# Patient Record
Sex: Female | Born: 1980 | Race: Black or African American | Hispanic: No | Marital: Married | Smoking: Never smoker
Health system: Southern US, Community
[De-identification: ages and names within clinical notes are randomized; demographics above are authoritative.]

## PROBLEM LIST (undated history)

## (undated) ENCOUNTER — Inpatient Hospital Stay (HOSPITAL_COMMUNITY): Payer: Self-pay

## (undated) DIAGNOSIS — E559 Vitamin D deficiency, unspecified: Secondary | ICD-10-CM

## (undated) DIAGNOSIS — K219 Gastro-esophageal reflux disease without esophagitis: Secondary | ICD-10-CM

## (undated) DIAGNOSIS — E739 Lactose intolerance, unspecified: Secondary | ICD-10-CM

## (undated) DIAGNOSIS — R0602 Shortness of breath: Secondary | ICD-10-CM

## (undated) DIAGNOSIS — R5383 Other fatigue: Secondary | ICD-10-CM

## (undated) DIAGNOSIS — D649 Anemia, unspecified: Secondary | ICD-10-CM

## (undated) HISTORY — DX: Lactose intolerance, unspecified: E73.9

## (undated) HISTORY — DX: Other fatigue: R53.83

## (undated) HISTORY — DX: Shortness of breath: R06.02

## (undated) HISTORY — DX: Anemia, unspecified: D64.9

## (undated) HISTORY — DX: Vitamin D deficiency, unspecified: E55.9

---
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
## 1999-04-20 ENCOUNTER — Inpatient Hospital Stay (HOSPITAL_COMMUNITY): Admission: AD | Admit: 1999-04-20 | Discharge: 1999-04-20 | Payer: Self-pay | Admitting: Obstetrics

## 1999-06-01 ENCOUNTER — Encounter: Payer: Self-pay | Admitting: Emergency Medicine

## 1999-06-01 ENCOUNTER — Emergency Department (HOSPITAL_COMMUNITY): Admission: EM | Admit: 1999-06-01 | Discharge: 1999-06-01 | Payer: Self-pay | Admitting: Emergency Medicine

## 1999-06-21 ENCOUNTER — Emergency Department (HOSPITAL_COMMUNITY): Admission: EM | Admit: 1999-06-21 | Discharge: 1999-06-21 | Payer: Self-pay | Admitting: Emergency Medicine

## 1999-07-08 ENCOUNTER — Inpatient Hospital Stay (HOSPITAL_COMMUNITY): Admission: AD | Admit: 1999-07-08 | Discharge: 1999-07-08 | Payer: Self-pay | Admitting: Obstetrics

## 1999-09-26 ENCOUNTER — Emergency Department (HOSPITAL_COMMUNITY): Admission: EM | Admit: 1999-09-26 | Discharge: 1999-09-26 | Payer: Self-pay | Admitting: Emergency Medicine

## 2000-08-07 ENCOUNTER — Inpatient Hospital Stay (HOSPITAL_COMMUNITY): Admission: AD | Admit: 2000-08-07 | Discharge: 2000-08-07 | Payer: Self-pay | Admitting: Obstetrics

## 2001-02-12 ENCOUNTER — Emergency Department (HOSPITAL_COMMUNITY): Admission: EM | Admit: 2001-02-12 | Discharge: 2001-02-12 | Payer: Self-pay | Admitting: Emergency Medicine

## 2001-02-13 ENCOUNTER — Encounter: Payer: Self-pay | Admitting: Emergency Medicine

## 2001-02-13 ENCOUNTER — Emergency Department (HOSPITAL_COMMUNITY): Admission: EM | Admit: 2001-02-13 | Discharge: 2001-02-13 | Payer: Self-pay

## 2001-08-20 ENCOUNTER — Encounter: Payer: Self-pay | Admitting: Emergency Medicine

## 2001-08-20 ENCOUNTER — Emergency Department (HOSPITAL_COMMUNITY): Admission: EM | Admit: 2001-08-20 | Discharge: 2001-08-20 | Payer: Self-pay | Admitting: *Deleted

## 2001-10-06 ENCOUNTER — Emergency Department (HOSPITAL_COMMUNITY): Admission: EM | Admit: 2001-10-06 | Discharge: 2001-10-07 | Payer: Self-pay | Admitting: Emergency Medicine

## 2002-05-26 ENCOUNTER — Inpatient Hospital Stay (HOSPITAL_COMMUNITY): Admission: AD | Admit: 2002-05-26 | Discharge: 2002-05-26 | Payer: Self-pay | Admitting: Obstetrics and Gynecology

## 2002-05-26 ENCOUNTER — Encounter: Payer: Self-pay | Admitting: Obstetrics

## 2002-05-28 ENCOUNTER — Inpatient Hospital Stay (HOSPITAL_COMMUNITY): Admission: AD | Admit: 2002-05-28 | Discharge: 2002-05-30 | Payer: Self-pay | Admitting: Obstetrics

## 2002-05-29 ENCOUNTER — Encounter: Payer: Self-pay | Admitting: Obstetrics

## 2002-06-03 ENCOUNTER — Inpatient Hospital Stay (HOSPITAL_COMMUNITY): Admission: AD | Admit: 2002-06-03 | Discharge: 2002-06-03 | Payer: Self-pay | Admitting: Obstetrics

## 2002-06-03 ENCOUNTER — Encounter: Payer: Self-pay | Admitting: Obstetrics

## 2002-06-06 ENCOUNTER — Ambulatory Visit (HOSPITAL_COMMUNITY): Admission: RE | Admit: 2002-06-06 | Discharge: 2002-06-06 | Payer: Self-pay | Admitting: Obstetrics

## 2002-06-06 ENCOUNTER — Encounter (INDEPENDENT_AMBULATORY_CARE_PROVIDER_SITE_OTHER): Payer: Self-pay

## 2002-09-26 ENCOUNTER — Inpatient Hospital Stay (HOSPITAL_COMMUNITY): Admission: AD | Admit: 2002-09-26 | Discharge: 2002-09-26 | Payer: Self-pay | Admitting: Obstetrics

## 2003-09-07 ENCOUNTER — Inpatient Hospital Stay (HOSPITAL_COMMUNITY): Admission: AD | Admit: 2003-09-07 | Discharge: 2003-09-07 | Payer: Self-pay | Admitting: Obstetrics

## 2003-09-08 ENCOUNTER — Inpatient Hospital Stay (HOSPITAL_COMMUNITY): Admission: AD | Admit: 2003-09-08 | Discharge: 2003-09-08 | Payer: Self-pay | Admitting: Obstetrics

## 2005-03-21 ENCOUNTER — Inpatient Hospital Stay (HOSPITAL_COMMUNITY): Admission: AD | Admit: 2005-03-21 | Discharge: 2005-03-21 | Payer: Self-pay | Admitting: Obstetrics

## 2005-03-24 ENCOUNTER — Inpatient Hospital Stay (HOSPITAL_COMMUNITY): Admission: AD | Admit: 2005-03-24 | Discharge: 2005-03-24 | Payer: Self-pay | Admitting: Obstetrics

## 2005-03-28 ENCOUNTER — Ambulatory Visit (HOSPITAL_COMMUNITY): Admission: AD | Admit: 2005-03-28 | Discharge: 2005-03-28 | Payer: Self-pay | Admitting: Obstetrics

## 2005-10-16 ENCOUNTER — Emergency Department (HOSPITAL_COMMUNITY): Admission: EM | Admit: 2005-10-16 | Discharge: 2005-10-17 | Payer: Self-pay | Admitting: Emergency Medicine

## 2006-05-09 ENCOUNTER — Inpatient Hospital Stay (HOSPITAL_COMMUNITY): Admission: AD | Admit: 2006-05-09 | Discharge: 2006-05-09 | Payer: Self-pay | Admitting: Obstetrics and Gynecology

## 2006-05-21 ENCOUNTER — Emergency Department (HOSPITAL_COMMUNITY): Admission: EM | Admit: 2006-05-21 | Discharge: 2006-05-21 | Payer: Self-pay | Admitting: Emergency Medicine

## 2006-06-03 ENCOUNTER — Emergency Department (HOSPITAL_COMMUNITY): Admission: EM | Admit: 2006-06-03 | Discharge: 2006-06-04 | Payer: Self-pay | Admitting: Emergency Medicine

## 2006-07-23 ENCOUNTER — Emergency Department (HOSPITAL_COMMUNITY): Admission: EM | Admit: 2006-07-23 | Discharge: 2006-07-24 | Payer: Self-pay | Admitting: Emergency Medicine

## 2006-11-14 ENCOUNTER — Inpatient Hospital Stay (HOSPITAL_COMMUNITY): Admission: AD | Admit: 2006-11-14 | Discharge: 2006-11-14 | Payer: Self-pay | Admitting: Obstetrics

## 2006-11-21 ENCOUNTER — Ambulatory Visit (HOSPITAL_COMMUNITY): Admission: RE | Admit: 2006-11-21 | Discharge: 2006-11-21 | Payer: Self-pay | Admitting: Obstetrics

## 2006-11-27 ENCOUNTER — Inpatient Hospital Stay (HOSPITAL_COMMUNITY): Admission: AD | Admit: 2006-11-27 | Discharge: 2006-11-27 | Payer: Self-pay | Admitting: Obstetrics

## 2007-03-29 ENCOUNTER — Inpatient Hospital Stay (HOSPITAL_COMMUNITY): Admission: AD | Admit: 2007-03-29 | Discharge: 2007-04-02 | Payer: Self-pay | Admitting: Obstetrics

## 2007-04-04 ENCOUNTER — Inpatient Hospital Stay (HOSPITAL_COMMUNITY): Admission: AD | Admit: 2007-04-04 | Discharge: 2007-04-07 | Payer: Self-pay | Admitting: Obstetrics and Gynecology

## 2007-09-05 ENCOUNTER — Emergency Department (HOSPITAL_COMMUNITY): Admission: EM | Admit: 2007-09-05 | Discharge: 2007-09-05 | Payer: Self-pay | Admitting: Emergency Medicine

## 2007-09-06 ENCOUNTER — Emergency Department (HOSPITAL_COMMUNITY): Admission: EM | Admit: 2007-09-06 | Discharge: 2007-09-06 | Payer: Self-pay | Admitting: Emergency Medicine

## 2007-09-07 ENCOUNTER — Ambulatory Visit: Payer: Self-pay | Admitting: Cardiology

## 2007-09-07 ENCOUNTER — Inpatient Hospital Stay (HOSPITAL_COMMUNITY): Admission: AD | Admit: 2007-09-07 | Discharge: 2007-09-10 | Payer: Self-pay | Admitting: Obstetrics

## 2007-09-07 ENCOUNTER — Ambulatory Visit: Payer: Self-pay | Admitting: Critical Care Medicine

## 2007-09-09 ENCOUNTER — Encounter: Payer: Self-pay | Admitting: Critical Care Medicine

## 2008-05-06 ENCOUNTER — Emergency Department (HOSPITAL_COMMUNITY): Admission: EM | Admit: 2008-05-06 | Discharge: 2008-05-06 | Payer: Self-pay | Admitting: Emergency Medicine

## 2008-06-12 ENCOUNTER — Emergency Department (HOSPITAL_COMMUNITY): Admission: EM | Admit: 2008-06-12 | Discharge: 2008-06-13 | Payer: Self-pay | Admitting: Emergency Medicine

## 2009-05-04 ENCOUNTER — Emergency Department (HOSPITAL_COMMUNITY): Admission: EM | Admit: 2009-05-04 | Discharge: 2009-05-04 | Payer: Self-pay | Admitting: Emergency Medicine

## 2009-07-08 ENCOUNTER — Encounter: Admission: RE | Admit: 2009-07-08 | Discharge: 2009-07-08 | Payer: Self-pay | Admitting: Occupational Medicine

## 2010-08-15 ENCOUNTER — Encounter: Payer: Self-pay | Admitting: Obstetrics

## 2010-12-07 NOTE — Consult Note (Signed)
Shelley Ramirez, Shelley Ramirez                ACCOUNT NO.:  [id number]   MEDICAL RECORD NO.:  [id number]          PATIENT TYPE:  INP   LOCATION:  9318                          [facility]ILITY:  [facility]   PHYSICIAN:  Charlcie Cradle. Delford Field, MD, FCCPDATE OF BIRTH:  18-Aug-1979   DATE OF CONSULTATION:  09/08/2007  DATE OF DISCHARGE:  09/10/2007                                 CONSULTATION   CHIEF COMPLAINT:  Bradycardia.   HISTORY OF PRESENT ILLNESS:  A 30 year old African American female began  having fever 3 days ago up to 102 degrees.  She had some dysuria,  increased nausea, some chest discomfort which radiated into the back.  She was not short of breath.  She had increased cough productive of  thick, yellow mucus, increased posterior nasal drainage and wheezing.  She went to the [facility] on Wednesday, was sent home, went  back on Thursday, was diagnosed urinary tract infection and treated with  Zofran and Septra.  She could not keep these medications down.  She came  back again to the [facility] emergency room on September 07, 2007 and was  admitted at that point.  She  she has become afebrile with the  institution of ampicillin but, at this point, is still having some  nausea.  She does not show elevated liver function tests.  She does show  hepatic edema on ultrasound but no evidence of acute hepatitis.  There  is a question of a viral syndrome here.  She has also become leukopenic  and as well has sinus bradycardia, but she is not dizzy from this.  We  are asked to assess all the constellation of symptoms.   PAST MEDICAL HISTORY:  1. She has had previous pregnancies.  2. SHE IS ALLERGIC TO CODEINE AND VICODIN.   No chronic medications.   FAMILY HISTORY AND SOCIAL HISTORY:  Noncontributory.  She is nonsmoker.   Pulse now is 40; blood pressure is 98/61; temperature 98; respirations  16, saturation 100% on room air.  CHEST:  Few expired wheezes, distant breath sounds.  CARDIAC:  Regular rate and  rhythm without S3, normal S1, S2.  ABDOMEN:  Nontender, and there was no organomegaly.  EXTREMITIES:  No edema, clubbing or venous disease.  SKIN:  Clear.  NEUROLOGIC:  Intact.  HEENT:  No jugular venous tension or lymphadenopathy.  Oropharynx clear.  NECK:  Supple.   LAB DATA:  Urinalysis showed cloudy urine with 3-6 white cells and many  bacteria on September 06, 2007.  Sodium 137, potassium 3.9, chloride 107,  CO2 25, BUN 1, creatinine 0.8, lipase 18.  White count initially was  4.3, now is 2.0.  Liver function tests are normal.  Chest x-ray showed  no active disease process.  EKG was reviewed showed sinus bradycardia.  Ultrasound showed diffuse hepatic edema, trace perihepatic ascites,  normal gallbladder.  The EKG does show the sinus bradycardia with more  prolonged QT interval and U waves are present. Potassium was low at 3.2.   IMPRESSION:  1. Asthmatic bronchitis.  2. Possibly early sinusitis.  3. Vagal response with sinus  bradycardia.  4. Urinary tract infection.  5. No evidence of hepatitis but does have hepatic edema.   RECOMMENDATIONS:  1. Transfer to ICU.  2. Give p.r.n. atropine for heart rate 35 or less.  3. Cardiac enzymes.  4. Check 2-D echocardiogram.  5. Give neb treatments.  6. Continue IV ampicillin as prescribed.      Charlcie Cradle Delford Field, MD, [facility]  Electronically Signed     PEW/MEDQ  D:  09/08/2007  T:  09/10/2007  Job:  [id number]   cc:   Kathreen Cosier, M.D.  Fax: [phone]   Charles A. Clearance Coots, M.D.  Fax: [phone]

## 2010-12-10 NOTE — Discharge Summary (Signed)
Shelley Ramirez, PFUND                ACCOUNT NO.:  [id number]   MEDICAL RECORD NO.:  [id number]          PATIENT TYPE:  INP   LOCATION:  9308                          [facility]ILITY:  [facility]   PHYSICIAN:  Kathreen Cosier, M.D.DATE OF BIRTH:  [birth year]   DATE OF ADMISSION:  04/04/2007  DATE OF DISCHARGE:  04/07/2007                               DISCHARGE SUMMARY   Patient is a 30 year old, gravida 4, para 1-0-2-1, [facility] November 19, 2007.  She was readmitted with hyperemesis.  She had been discharged on the  previous Monday on Zofran.  SHE IS ALLERGIC TO CODEINE, and the patient  was started on steroid taper.  By September 12, she felt much better.  By September 13, she had some spotting the previous night but stated she  had to go home.  She could not wait for her ultrasound, so she was  discharged on September 13 on the steroid taper to see me in the office  in 2 weeks.   DISCHARGE DIAGNOSIS:  Status post hyperemesis.           ______________________________  Kathreen Cosier, M.D.     BAM/MEDQ  D:  05/09/2007  T:  05/09/2007  Job:  [id number]

## 2010-12-10 NOTE — Discharge Summary (Signed)
Shelley Ramirez, Shelley Ramirez                ACCOUNT NO.:  [id number]   MEDICAL RECORD NO.:  [id number]          PATIENT TYPE:  INP   LOCATION:  9303                          [facility]  [facility]   PHYSICIAN:  Kathreen Cosier, M.D.DATE OF BIRTH:  Dec 04, 1980   DATE OF ADMISSION:  03/29/2007  DATE OF DISCHARGE:  04/02/2007                               DISCHARGE SUMMARY   HISTORY:  The patient is a 30 year old gravida 4, para 1-2-1-3 had a  spontaneous abortion in July and was early pregnant.  She was in because  of hyperemesis.  She was started on Pepcid 20 p.o. b.i.d., Reglan 10 mg  t.i.d. and initially did not respond to the Reglan.  She was started on  Zofran 4 mg p.o. b.i.d. and Darvocet-N 100 for pain.  She did well and  was discharged home on September 8 on Zofran 4 mg b.i.d. and Darvocet.   DISCHARGE DIAGNOSIS:  Status post hyperemesis.           ______________________________  Kathreen Cosier, M.D.     BAM/MEDQ  D:  05/16/2007  T:  05/16/2007  Job:  [id number]

## 2010-12-10 NOTE — Op Note (Signed)
   Shelley Ramirez, Shelley Ramirez                            ACCOUNT NO.:  [id number]   MEDICAL RECORD NO.:  [id number]                   PATIENT TYPE:  AMB   LOCATION:  SDC                                  [facility]  [facility]   PHYSICIAN:  Kathreen Cosier, M.D.           DATE OF BIRTH:  [birth year]   DATE OF PROCEDURE:  06/06/2002  DATE OF DISCHARGE:                                 OPERATIVE REPORT   PREOPERATIVE DIAGNOSES:  Spontaneous incomplete abortion.   PROCEDURE:  Dilatation and evacuation.  Under MAC, patient in lithotomy  position, perineum and vagina prepped and draped.  Bladder emptied with a  straight catheter.  Bimanual examination revealed the uterus to be top  normal in size.  Negative adnexa.  Weighted speculum placed in the vagina.  Anterior lip of the cervix grasped with a tenaculum.  The cervix was  injected with 1% Xylocaine at 3, 9, 12 o'clock.  Total of 7 cc.  The  cervical canal was open and the endometrial cavity sounded 9 cm.  The cervix  easily admitted a number 9 suction and the cavity curetted until clean.  The  patient tolerated procedure well.  Taken to recovery room in good condition.                                               Kathreen Cosier, M.D.    BAM/MEDQ  D:  06/06/2002  T:  06/06/2002  Job:  [id number]

## 2010-12-10 NOTE — Discharge Summary (Signed)
NAMEANGELA, VAZGUEZ                ACCOUNT NO.:  [id number]   MEDICAL RECORD NO.:  [id number]          PATIENT TYPE:  INP   LOCATION:  9318                          [facility]   PHYSICIAN:  Kathreen Cosier, M.D.DATE OF BIRTH:  1980-11-20   DATE OF ADMISSION:  09/07/2007  DATE OF DISCHARGE:  09/10/2007                               DISCHARGE SUMMARY   HISTORY:  The patient is a 30 year old gravida 4, para 1-0-3-1.  Last  menstrual period was a week prior to admission.  She is not pregnant.  She had vomiting x2 days.  She went to [facility] Emergency Room x2 and told  had reflux.  She also had a fever of 100.8 and 102.  On examination, her  abdomen was soft, nontender.  She was started on ampicillin 2 gm IV.  She had a gallbladder ultrasound on admission which showed diffuse  hepatic edema and trace perihepatic ascites with possible diagnosis of  hepatitis.  However, her liver enzymes were normal.  She developed  bradycardia.  EEG was normal.  She was seen by pulmonary.  Her 2-D echo  was normal.  Pulse normal.  Blood pressure normal.  She was seen in  consult by Dr. Shana Chute.  Workup was negative for hepatitis.   FOLLOW UP:  She will be followed as an outpatient by Dr. Shana Chute.   DIAGNOSTICS:  Possible viral syndrome.           ______________________________  Kathreen Cosier, M.D.     BAM/MEDQ  D:  09/26/2007  T:  09/26/2007  Job:  [id number]

## 2010-12-10 NOTE — Op Note (Signed)
Shelley Ramirez, Shelley Ramirez                ACCOUNT NO.:  [id number]   MEDICAL RECORD NO.:  [id number]          PATIENT TYPE:  AMB   LOCATION:  MATC                          [facility]  [facility]   PHYSICIAN:  Roseanna Rainbow, M.D.DATE OF BIRTH:  1981-05-19   DATE OF PROCEDURE:  03/28/2005  DATE OF DISCHARGE:                                 OPERATIVE REPORT   PREOPERATIVE DIAGNOSIS:  Left-sided Bartholin's gland abscess.   POSTOPERATIVE DIAGNOSIS:  Left-sided Bartholin's gland abscess.   PROCEDURE:  Marsupialization of left Bartholin's gland.   SURGEON:  Roseanna Rainbow, M.D.   ANESTHESIA:  Laryngeal mask airway.   INTRAVENOUS FLUIDS:  As per anesthesiology.   URINE OUTPUT:  100 cc clear urine at the beginning of the procedure.   ESTIMATED BLOOD LOSS:  Minimal.   COMPLICATIONS:  None.   DESCRIPTION OF PROCEDURE:  The patient was taken to the operating room with  an IV running.  A laryngeal mask airway was then placed without difficulty.  The patient was placed in the dorsal lithotomy position and prepped and  draped in the usual sterile fashion.  An elliptical skin incision was made  at the mucocutaneous junction over the left Bartholin's gland.  The mucosa  was excised.  The abscess cavity was then incised and drained.  Copious  purulent material was evacuated.  The abscess cavity was then copiously  irrigated.  The cyst wall was then  approximated to the mucosa using interrupted sutures of 2-0 Rapide.  Adequate hemostasis was noted.  At the close of the procedure, the  instrument and pack counts were said to be correct x2.  The patient was  taken to the PACU awake and in stable condition.      Roseanna Rainbow, M.D.  Electronically Signed     LAJ/MEDQ  D:  03/28/2005  T:  03/29/2005  Job:  [id number]   cc:   Kathreen Cosier, M.D.  [address]  [place]  [place] [address]  Fax: [phone]

## 2011-04-15 LAB — HEPATITIS PANEL, ACUTE
HCV Ab: NEGATIVE
Hep A IgM: NEGATIVE

## 2011-04-15 LAB — URINE MICROSCOPIC-ADD ON

## 2011-04-15 LAB — URINALYSIS, ROUTINE W REFLEX MICROSCOPIC
Glucose, UA: NEGATIVE
Glucose, UA: NEGATIVE
Ketones, ur: >80 — AB
Ketones, ur: >80 — AB
Leukocytes, UA: NEGATIVE
Leukocytes, UA: NEGATIVE
Nitrite: NEGATIVE
Protein, ur: 100 — AB
Protein, ur: 100 — AB
Specific Gravity, Urine: 1.024
Urobilinogen, UA: 1
Urobilinogen, UA: 1
pH: 8.5 — ABNORMAL HIGH

## 2011-04-15 LAB — COMPREHENSIVE METABOLIC PANEL
ALT: 13
AST: 17
Albumin: 4.3
Calcium: 9.3
Creatinine, Ser: 0.84
GFR calc Af Amer: >60
Sodium: 137
Total Protein: 7.1

## 2011-04-15 LAB — CBC
MCHC: 33.1
MCV: 95.4
Platelets: 243
RBC: 3.94
WBC: 4.3

## 2011-04-15 LAB — LIPASE, BLOOD: Lipase: 18

## 2011-04-15 LAB — DIFFERENTIAL
Eosinophils Absolute: 0
Eosinophils Relative: 1
Lymphocytes Relative: 9 — ABNORMAL LOW
Lymphs Abs: 0.4 — ABNORMAL LOW
Monocytes Absolute: 0.5
Monocytes Relative: 12

## 2011-04-15 LAB — PREGNANCY, URINE: Preg Test, Ur: NEGATIVE

## 2011-04-18 LAB — COMPREHENSIVE METABOLIC PANEL
ALT: 17
ALT: 20
AST: 47 — ABNORMAL HIGH
Albumin: 3 — ABNORMAL LOW
Albumin: 3.3 — ABNORMAL LOW
Alkaline Phosphatase: 29 — ABNORMAL LOW
Calcium: 8.3 — ABNORMAL LOW
Creatinine, Ser: 1
GFR calc Af Amer: >60
Potassium: 3.2 — ABNORMAL LOW
Sodium: 137
Sodium: 139
Total Protein: 5.4 — ABNORMAL LOW
Total Protein: 5.8 — ABNORMAL LOW

## 2011-04-18 LAB — BASIC METABOLIC PANEL
BUN: 1 — ABNORMAL LOW
CO2: 30
Chloride: 102
Creatinine, Ser: 0.8
Potassium: 4.2

## 2011-04-18 LAB — DIFFERENTIAL
Basophils Relative: 1
Eosinophils Absolute: 0.1
Lymphs Abs: 1.8
Monocytes Absolute: 0.3
Neutrophils Relative %: 12 — ABNORMAL LOW

## 2011-04-18 LAB — CBC
MCHC: 34.5
MCHC: 34.9
MCV: 93.3
Platelets: 173
RBC: 3.32 — ABNORMAL LOW
RBC: 3.59 — ABNORMAL LOW
RDW: 15.4

## 2011-04-18 LAB — HEPATITIS B CORE ANTIBODY, IGM: Hep B C IgM: NEGATIVE

## 2011-04-18 LAB — EPSTEIN-BARR VIRUS VCA ANTIBODY PANEL
EBV NA IgG: >4 — ABNORMAL HIGH
EBV VCA IgG: 5.34 — ABNORMAL HIGH
EBV VCA IgM: 0.2

## 2011-04-18 LAB — CK TOTAL AND CKMB (NOT AT ARMC): Total CK: 2580 — ABNORMAL HIGH

## 2011-04-18 LAB — HEPATITIS C ANTIBODY: HCV Ab: NEGATIVE

## 2011-04-26 LAB — CBC
Hemoglobin: 11.8 — ABNORMAL LOW
MCHC: 33.3
MCV: 96.9
RBC: 3.66 — ABNORMAL LOW
RDW: 13

## 2011-04-26 LAB — BASIC METABOLIC PANEL
CO2: 27
Calcium: 8.7
Chloride: 104
Creatinine, Ser: 0.92
Glucose, Bld: 84

## 2011-04-26 LAB — URINALYSIS, ROUTINE W REFLEX MICROSCOPIC
Ketones, ur: 15 — AB
Nitrite: NEGATIVE
Protein, ur: 100 — AB
Specific Gravity, Urine: 1.037 — ABNORMAL HIGH
Urobilinogen, UA: 2 — ABNORMAL HIGH

## 2011-04-26 LAB — DIFFERENTIAL
Basophils Absolute: 0
Basophils Relative: 0
Eosinophils Absolute: 0
Monocytes Absolute: 0.4
Monocytes Relative: 10
Neutro Abs: 2.2
Neutrophils Relative %: 58

## 2011-05-06 LAB — COMPREHENSIVE METABOLIC PANEL WITH GFR
ALT: 16
ALT: 21
AST: 20
AST: 25
Albumin: 3.8
Albumin: 3.9
Alkaline Phosphatase: 36 — ABNORMAL LOW
Alkaline Phosphatase: 37 — ABNORMAL LOW
BUN: 3 — ABNORMAL LOW
BUN: 5 — ABNORMAL LOW
CO2: 25
CO2: 27
Calcium: 8.9
Calcium: 9.1
Chloride: 101
Chloride: 103
Creatinine, Ser: 0.75
Creatinine, Ser: 0.78
GFR calc non Af Amer: >60
GFR calc non Af Amer: >60
Glucose, Bld: 80
Glucose, Bld: 85
Potassium: 3.3 — ABNORMAL LOW
Potassium: 3.5
Sodium: 134 — ABNORMAL LOW
Sodium: 134 — ABNORMAL LOW
Total Bilirubin: 0.6
Total Bilirubin: 0.6
Total Protein: 6.9
Total Protein: 7.2

## 2011-05-06 LAB — URINALYSIS, ROUTINE W REFLEX MICROSCOPIC
Glucose, UA: NEGATIVE
Glucose, UA: NEGATIVE
Hgb urine dipstick: NEGATIVE
Ketones, ur: 15 — AB
Leukocytes, UA: NEGATIVE
Nitrite: NEGATIVE
Protein, ur: NEGATIVE
Protein, ur: NEGATIVE
Specific Gravity, Urine: 1.015
Specific Gravity, Urine: 1.02
Urobilinogen, UA: 1
pH: 7
pH: 7

## 2011-05-06 LAB — POCT PREGNANCY, URINE
Operator id: 134651
Preg Test, Ur: POSITIVE

## 2011-05-06 LAB — URINE MICROSCOPIC-ADD ON

## 2011-05-06 LAB — SYPHILIS: RPR W/REFLEX TO RPR TITER AND TREPONEMAL ANTIBODIES, TRADITIONAL SCREENING AND DIAGNOSIS ALGORITHM: RPR Ser Ql: NONREACTIVE

## 2011-05-06 LAB — BASIC METABOLIC PANEL
CO2: 26
GFR calc Af Amer: >60
GFR calc non Af Amer: >60
Glucose, Bld: 81
Potassium: 3.9
Sodium: 136

## 2011-05-06 LAB — ABO/RH: ABO/RH(D): B POS

## 2011-05-06 LAB — RUBELLA SCREEN: Rubella: 225.8 — ABNORMAL HIGH

## 2011-05-06 LAB — HEPATITIS B SURFACE ANTIGEN: Hepatitis B Surface Ag: NEGATIVE

## 2011-10-12 ENCOUNTER — Encounter (HOSPITAL_COMMUNITY): Payer: Self-pay | Admitting: *Deleted

## 2011-10-12 ENCOUNTER — Emergency Department (HOSPITAL_COMMUNITY): Payer: Self-pay

## 2011-10-12 ENCOUNTER — Emergency Department (HOSPITAL_COMMUNITY)
Admission: EM | Admit: 2011-10-12 | Discharge: 2011-10-12 | Disposition: A | Discharge status: Home / Self Care | Payer: Self-pay | Attending: Emergency Medicine | Admitting: Emergency Medicine

## 2011-10-12 ENCOUNTER — Other Ambulatory Visit: Payer: Self-pay

## 2011-10-12 DIAGNOSIS — R197 Diarrhea, unspecified: Secondary | ICD-10-CM | POA: Insufficient documentation

## 2011-10-12 DIAGNOSIS — R1013 Epigastric pain: Secondary | ICD-10-CM | POA: Insufficient documentation

## 2011-10-12 DIAGNOSIS — K2970 Gastritis, unspecified, without bleeding: Secondary | ICD-10-CM | POA: Insufficient documentation

## 2011-10-12 DIAGNOSIS — R079 Chest pain, unspecified: Secondary | ICD-10-CM | POA: Insufficient documentation

## 2011-10-12 DIAGNOSIS — R112 Nausea with vomiting, unspecified: Secondary | ICD-10-CM | POA: Insufficient documentation

## 2011-10-12 DIAGNOSIS — R002 Palpitations: Secondary | ICD-10-CM | POA: Insufficient documentation

## 2011-10-12 LAB — DIFFERENTIAL
Basophils Absolute: 0 10*3/uL (ref 0.0–0.1)
Eosinophils Relative: 1 % (ref 0–5)
Lymphocytes Relative: 14 % (ref 12–46)
Lymphs Abs: 1.2 10*3/uL (ref 0.7–4.0)
Monocytes Absolute: 0.4 10*3/uL (ref 0.1–1.0)
Monocytes Relative: 5 % (ref 3–12)
Neutro Abs: 6.6 10*3/uL (ref 1.7–7.7)

## 2011-10-12 LAB — URINALYSIS, ROUTINE W REFLEX MICROSCOPIC
Glucose, UA: NEGATIVE mg/dL
Hgb urine dipstick: NEGATIVE
Ketones, ur: NEGATIVE mg/dL
Leukocytes, UA: NEGATIVE
Protein, ur: NEGATIVE mg/dL
Urobilinogen, UA: 1 mg/dL (ref 0.0–1.0)

## 2011-10-12 LAB — COMPREHENSIVE METABOLIC PANEL
AST: 15 U/L (ref 0–37)
CO2: 25 mEq/L (ref 19–32)
Calcium: 7.9 mg/dL — ABNORMAL LOW (ref 8.4–10.5)
Chloride: 109 mEq/L (ref 96–112)
Creatinine, Ser: 0.81 mg/dL (ref 0.50–1.10)
GFR calc Af Amer: >90 mL/min (ref >90)
GFR calc non Af Amer: >90 mL/min (ref >90)
Glucose, Bld: 85 mg/dL (ref 70–99)
Total Bilirubin: 0.5 mg/dL (ref 0.3–1.2)

## 2011-10-12 LAB — CBC
HCT: 33.8 % — ABNORMAL LOW (ref 36.0–46.0)
Hemoglobin: 11.2 g/dL — ABNORMAL LOW (ref 12.0–15.0)
MCV: 86.7 fL (ref 78.0–100.0)
RBC: 3.9 MIL/uL (ref 3.87–5.11)
WBC: 8.3 10*3/uL (ref 4.0–10.5)

## 2011-10-12 MED ORDER — ONDANSETRON HCL 4 MG/2ML IJ SOLN
4.0000 mg | Freq: Once | INTRAMUSCULAR | Status: AC
  Administered 2011-10-12: 4 mg via INTRAVENOUS
  Filled 2011-10-12: qty 2

## 2011-10-12 MED ORDER — GI COCKTAIL ~~LOC~~
30.0000 mL | Freq: Once | ORAL | Status: AC
  Administered 2011-10-12: 30 mL via ORAL
  Filled 2011-10-12: qty 30

## 2011-10-12 MED ORDER — SODIUM CHLORIDE 0.9 % IV BOLUS (SEPSIS)
1000.0000 mL | Freq: Once | INTRAVENOUS | Status: AC
  Administered 2011-10-12: 1000 mL via INTRAVENOUS

## 2011-10-12 MED ORDER — ONDANSETRON HCL 4 MG PO TABS: 4.0000 mg | ORAL_TABLET | Freq: Four times a day (QID) | ORAL | Status: AC

## 2011-10-12 MED ORDER — ONDANSETRON HCL 4 MG/2ML IJ SOLN
INTRAMUSCULAR | Status: AC
  Administered 2011-10-12: 12:00:00
  Filled 2011-10-12: qty 2

## 2011-10-12 MED ORDER — LANSOPRAZOLE 30 MG PO CPDR: 30.0000 mg | DELAYED_RELEASE_CAPSULE | Freq: Every day | ORAL | Status: AC

## 2011-10-12 MED ORDER — PANTOPRAZOLE SODIUM 40 MG IV SOLR
40.0000 mg | Freq: Once | INTRAVENOUS | Status: AC
  Administered 2011-10-12: 40 mg via INTRAVENOUS
  Filled 2011-10-12: qty 40

## 2011-10-12 NOTE — ED Provider Notes (Signed)
History     CSN: [id number]  Arrival date & time 10/12/11  1137   First MD Initiated Contact with Patient 10/12/11 1216      Chief Complaint  Patient presents with  . Abdominal Pain    pt reports sudden onset of n/v around 0400 today. pt also c/o upper abd pain. pt was given 4mg  Zofran pta.   . Chest Pain    pt c/o pain to center of chest, described as tightening sensation. also reports "heart fluttering" on yesterday.     (Consider location/radiation/quality/duration/timing/severity/associated sxs/prior treatment) Patient is a 31 y.o. female presenting with abdominal pain and chest pain. The history is provided by the patient.  Abdominal Pain The primary symptoms of the illness include abdominal pain, nausea, vomiting and diarrhea. The primary symptoms of the illness do not include fever, shortness of breath or dysuria.  Symptoms associated with the illness do not include chills or back pain.  Chest Pain Primary symptoms include palpitations, abdominal pain, nausea and vomiting. Pertinent negatives for primary symptoms include no fever, no shortness of breath, no cough, no wheezing and no dizziness.  The palpitations did not occur with dizziness or shortness of breath.  Pertinent negatives for associated symptoms include no numbness and no weakness.   Pt has new onset vomiting starting last night. The pt estimates vomiting >40 times. She now c/o epigastric pain and chest pain worse with palpation. No fever, chills. 1 episode of diarrhea. No SOB, cough, leg swelling or pain.   History reviewed. No pertinent past medical history.  Past Surgical History  Procedure Date  . Cesarean section     14years ago    History reviewed. No pertinent family history.  History  Substance Use Topics  . Smoking status: Never Smoker   . Smokeless tobacco: Not on file  . Alcohol Use: No    OB History    Grav Para Term Preterm Abortions TAB SAB Ect Mult Living                  Review of  Systems  Constitutional: Negative for fever and chills.  Respiratory: Negative for cough, chest tightness, shortness of breath and wheezing.   Cardiovascular: Positive for chest pain and palpitations. Negative for leg swelling.  Gastrointestinal: Positive for nausea, vomiting, abdominal pain and diarrhea. Negative for blood in stool and abdominal distention.  Genitourinary: Negative for dysuria and flank pain.  Musculoskeletal: Negative for back pain.  Skin: Negative for pallor and rash.  Neurological: Negative for dizziness, weakness, numbness and headaches.    Allergies  Codeine  Home Medications   Current Outpatient Rx  Name Route Sig Dispense Refill  . ACETAMINOPHEN-CAFF-PYRILAMINE 500-60-15 MG PO TABS Oral Take 1 tablet by mouth every 6 (six) hours as needed. For cramping    . LANSOPRAZOLE 30 MG PO CPDR Oral Take 1 capsule (30 mg total) by mouth daily. 30 capsule 0  . ONDANSETRON HCL 4 MG PO TABS Oral Take 1 tablet (4 mg total) by mouth every 6 (six) hours. 12 tablet 0    BP 105/50  Pulse 75  Temp(Src) 98.7 F (37.1 C) (Oral)  Resp 16  SpO2 100%  LMP 09/15/2011  Physical Exam  Nursing note and vitals reviewed. Constitutional: She is oriented to person, place, and time. She appears well-developed and well-nourished. No distress.  HENT:  Head: Normocephalic and atraumatic.  Mouth/Throat: Oropharynx is clear and moist.  Eyes: EOM are normal. Pupils are equal, round, and reactive to light.  Neck: Normal range of motion. Neck supple.  Cardiovascular: Normal rate and regular rhythm.   Pulmonary/Chest: Effort normal and breath sounds normal. No respiratory distress. She has no wheezes. She has no rales. She exhibits tenderness (Pain reproduced with palpation over sternum).  Abdominal: Soft. Bowel sounds are normal. She exhibits no distension. There is tenderness (tenderness over epigastrum). There is no rebound and no guarding.  Musculoskeletal: Normal range of motion. She  exhibits no edema and no tenderness (No calf swelling or tenderness).  Neurological: She is alert and oriented to person, place, and time.  Skin: Skin is warm and dry. No rash noted. No erythema.  Psychiatric: She has a normal mood and affect. Her behavior is normal.    ED Course  Procedures (including critical care time)  Labs Reviewed  CBC - Abnormal; Notable for the following:    Hemoglobin 11.2 (*)    HCT 33.8 (*)    All other components within normal limits  DIFFERENTIAL - Abnormal; Notable for the following:    Neutrophils Relative 80 (*)    All other components within normal limits  COMPREHENSIVE METABOLIC PANEL - Abnormal; Notable for the following:    Calcium 7.9 (*)    Albumin 3.3 (*)    All other components within normal limits  LIPASE, BLOOD  URINALYSIS, ROUTINE W REFLEX MICROSCOPIC  PREGNANCY, URINE  POCT I-STAT TROPONIN I   Dg Abd Acute W/chest  10/12/2011  *RADIOLOGY REPORT*  Clinical Data: Abdominal pain and chest pain  ACUTE ABDOMEN SERIES (ABDOMEN 2 VIEW & CHEST 1 VIEW)  Comparison: None.  Findings: Normal cardiac silhouette.  Lungs are clear.  No free air beneath hemidiaphragms.  There are no dilated loops of large or small bowel.  No pathologic calcifications.  No acute osseous abnormality.  Umbilical jewelry noted.  IMPRESSION:  1.  No acute cardiopulmonary process.  2.  No bowel obstruction or intraperitoneal free air.  3.  Normal study.  Original Report Authenticated By: Genevive Bi, M.D.     1. Gastritis      Date: 10/12/2011  Rate: 68  Rhythm: normal sinus rhythm  QRS Axis: normal  Intervals: normal  ST/T Wave abnormalities: normal  Conduction Disutrbances:none  Narrative Interpretation:   Old EKG Reviewed: unchanged    MDM  Pt states she is feeling much better after meds and IVF's. Will d/c home with antiemetic. Return for concerns.        Loren Racer, MD 10/12/11 1535

## 2011-10-12 NOTE — Discharge Instructions (Signed)
Gastritis  Gastritis is an irritation of the stomach. It can be caused by anything that bothers the stomach. Some irritants include:   Alcohol.    Caffeine.    Nicotine.    Spicy, acidic, greasy, and fried foods.    Medicines for pain and arthritis.    Emotional distress.   HOME CARE     Only take medicine as told by your doctor.    Take small sips of clear liquids often. Do not drink large amounts of liquids at one time.    If you have watery poop (diarrhea), avoid milk, fruit, tobacco, alcohol, really hot or cold liquids, or too much of anything at one time.    Rest.    Wash your hands often.    Once you can keep clear liquids down, start soups, juices, apple sauce, crackers, and sherbet. Slowly add plain, not spicy, foods to your diet.   GET HELP RIGHT AWAY IF:     You cannot keep fluids down.    You cannot stop throwing up (vomiting) or you throw up blood.    You have more stomach or chest pain.    You have a temperature by mouth above 102 F (38.9 C), not controlled by medicine.    You pass out (faint), feel lightheaded, or are more thirsty than normal.    You have bloody or black poop (stools).    Your watery poop will not go away.    You are not improving or are getting worse.   MAKE SURE YOU:     Understand these instructions.    Will watch your condition.    Will get help right away if you are not doing well or get worse.   Document Released: 12/28/2007 Document Revised: 06/30/2011 Document Reviewed: 05/29/2009  [employer] Patient Information 2012 [employer].

## 2011-10-12 NOTE — ED Notes (Signed)
Patient transported to X-ray 

## 2013-08-27 ENCOUNTER — Encounter (HOSPITAL_COMMUNITY): Payer: Self-pay | Admitting: Emergency Medicine

## 2013-08-27 ENCOUNTER — Emergency Department (HOSPITAL_COMMUNITY)
Admission: EM | Admit: 2013-08-27 | Discharge: 2013-08-27 | Disposition: A | Discharge status: Home / Self Care | Payer: Self-pay | Attending: Emergency Medicine | Admitting: Emergency Medicine

## 2013-08-27 DIAGNOSIS — Z3202 Encounter for pregnancy test, result negative: Secondary | ICD-10-CM | POA: Insufficient documentation

## 2013-08-27 DIAGNOSIS — R1013 Epigastric pain: Secondary | ICD-10-CM | POA: Insufficient documentation

## 2013-08-27 DIAGNOSIS — Z79899 Other long term (current) drug therapy: Secondary | ICD-10-CM | POA: Insufficient documentation

## 2013-08-27 DIAGNOSIS — K219 Gastro-esophageal reflux disease without esophagitis: Secondary | ICD-10-CM | POA: Insufficient documentation

## 2013-08-27 DIAGNOSIS — R6883 Chills (without fever): Secondary | ICD-10-CM | POA: Insufficient documentation

## 2013-08-27 DIAGNOSIS — R112 Nausea with vomiting, unspecified: Secondary | ICD-10-CM | POA: Insufficient documentation

## 2013-08-27 HISTORY — DX: Gastro-esophageal reflux disease without esophagitis: K21.9

## 2013-08-27 LAB — URINALYSIS, ROUTINE W REFLEX MICROSCOPIC
BILIRUBIN URINE: NEGATIVE
Glucose, UA: NEGATIVE mg/dL
HGB URINE DIPSTICK: NEGATIVE
Ketones, ur: NEGATIVE mg/dL
Leukocytes, UA: NEGATIVE
Nitrite: NEGATIVE
PH: 8 (ref 5.0–8.0)
Protein, ur: NEGATIVE mg/dL
Specific Gravity, Urine: 1.023 (ref 1.005–1.030)
Urobilinogen, UA: 1 mg/dL (ref 0.0–1.0)

## 2013-08-27 LAB — CBC WITH DIFFERENTIAL/PLATELET
BASOS PCT: 0 % (ref 0–1)
Basophils Absolute: 0 10*3/uL (ref 0.0–0.1)
Eosinophils Absolute: 0.3 10*3/uL (ref 0.0–0.7)
Eosinophils Relative: 4 % (ref 0–5)
HCT: 34.2 % — ABNORMAL LOW (ref 36.0–46.0)
HEMOGLOBIN: 10.9 g/dL — AB (ref 12.0–15.0)
LYMPHS ABS: 1.4 10*3/uL (ref 0.7–4.0)
Lymphocytes Relative: 19 % (ref 12–46)
MCH: 25.5 pg — ABNORMAL LOW (ref 26.0–34.0)
MCHC: 31.9 g/dL (ref 30.0–36.0)
MCV: 79.9 fL (ref 78.0–100.0)
MONOS PCT: 10 % (ref 3–12)
Monocytes Absolute: 0.7 10*3/uL (ref 0.1–1.0)
NEUTROS ABS: 4.8 10*3/uL (ref 1.7–7.7)
NEUTROS PCT: 67 % (ref 43–77)
Platelets: 302 10*3/uL (ref 150–400)
RBC: 4.28 MIL/uL (ref 3.87–5.11)
RDW: 15.3 % (ref 11.5–15.5)
WBC: 7.1 10*3/uL (ref 4.0–10.5)

## 2013-08-27 LAB — COMPREHENSIVE METABOLIC PANEL
ALBUMIN: 4 g/dL (ref 3.5–5.2)
ALK PHOS: 45 U/L (ref 39–117)
ALT: 11 U/L (ref 0–35)
AST: 16 U/L (ref 0–37)
BUN: 11 mg/dL (ref 6–23)
CHLORIDE: 105 meq/L (ref 96–112)
CO2: 24 mEq/L (ref 19–32)
Calcium: 8.9 mg/dL (ref 8.4–10.5)
Creatinine, Ser: 0.85 mg/dL (ref 0.50–1.10)
GFR calc Af Amer: >90 mL/min (ref >90)
GFR calc non Af Amer: 89 mL/min — ABNORMAL LOW (ref >90)
GLUCOSE: 84 mg/dL (ref 70–99)
Potassium: 4.3 mEq/L (ref 3.7–5.3)
Sodium: 139 mEq/L (ref 137–147)
Total Bilirubin: 0.3 mg/dL (ref 0.3–1.2)
Total Protein: 7.7 g/dL (ref 6.0–8.3)

## 2013-08-27 LAB — POCT PREGNANCY, URINE: Preg Test, Ur: NEGATIVE

## 2013-08-27 LAB — LIPASE, BLOOD: LIPASE: 29 U/L (ref 11–59)

## 2013-08-27 MED ORDER — FENTANYL CITRATE 0.05 MG/ML IJ SOLN
50.0000 ug | Freq: Once | INTRAMUSCULAR | Status: AC
  Administered 2013-08-27: 50 ug via INTRAVENOUS
  Filled 2013-08-27: qty 2

## 2013-08-27 MED ORDER — PROMETHAZINE HCL 25 MG PO TABS: 25.0000 mg | ORAL_TABLET | Freq: Four times a day (QID) | ORAL | Status: DC | PRN

## 2013-08-27 MED ORDER — ONDANSETRON HCL 4 MG/2ML IJ SOLN
4.0000 mg | Freq: Once | INTRAMUSCULAR | Status: AC
  Administered 2013-08-27: 4 mg via INTRAVENOUS
  Filled 2013-08-27: qty 2

## 2013-08-27 MED ORDER — PROMETHAZINE HCL 25 MG RE SUPP: 25.0000 mg | Freq: Four times a day (QID) | RECTAL | Status: DC | PRN

## 2013-08-27 MED ORDER — OMEPRAZOLE 20 MG PO CPDR: 20.0000 mg | DELAYED_RELEASE_CAPSULE | Freq: Every day | ORAL | Status: DC

## 2013-08-27 MED ORDER — SODIUM CHLORIDE 0.9 % IV SOLN
Freq: Once | INTRAVENOUS | Status: AC
  Administered 2013-08-27: 12:00:00 via INTRAVENOUS

## 2013-08-27 NOTE — ED Provider Notes (Signed)
CSN: [id number]     Arrival date & time 08/27/13  1114 History   First MD Initiated Contact with Patient 08/27/13 1119     Chief Complaint  Patient presents with  . Nausea  . Emesis  . Abdominal Pain   (Consider location/radiation/quality/duration/timing/severity/associated sxs/prior Treatment) Patient is a 33 y.o. female presenting with vomiting and abdominal pain.  Emesis Associated symptoms: abdominal pain and chills   Abdominal Pain Associated symptoms: chills and vomiting    33 yo female woke up this morning with upper abdominal pain with associated N/V x 10+ episodes. Patient states pain is 8/10 crampy originally now more sharp. Pain is intermittent and does not radiate. Patient not tried anything. SXs not precipitated by eating. Patient has hx of acid reflux and Ceasarian section. Denies alcohol or tobacco use. Patient denies chest pain, SOB, Diarrhea, Constipation, or any urinary sxs. LMP was "second week of January". Patient does not use contraception.  Past Medical History  Diagnosis Date  . Acid reflux    Past Surgical History  Procedure Laterality Date  . Cesarean section      14years ago   No family history on file. History  Substance Use Topics  . Smoking status: Never Smoker   . Smokeless tobacco: Not on file  . Alcohol Use: No   OB History   Grav Para Term Preterm Abortions TAB SAB Ect Mult Living                 Review of Systems  Constitutional: Positive for chills.  Gastrointestinal: Positive for vomiting and abdominal pain.  All other systems reviewed and are negative.    Allergies  Codeine  Home Medications   Current Outpatient Rx  Name  Route  Sig  Dispense  Refill  . omeprazole (PRILOSEC) 20 MG capsule   Oral   Take 1 capsule (20 mg total) by mouth daily.   30 capsule   0   . promethazine (PHENERGAN) 25 MG suppository   Rectal   Place 1 suppository (25 mg total) rectally every 6 (six) hours as needed for nausea or vomiting.   12  each   0   . promethazine (PHENERGAN) 25 MG tablet   Oral   Take 1 tablet (25 mg total) by mouth every 6 (six) hours as needed for nausea or vomiting.   12 tablet   0    BP 118/78  Pulse 83  Temp(Src) 97.8 F (36.6 C) (Oral)  Resp 16  SpO2 100%  LMP 07/28/2013 Physical Exam  Nursing note and vitals reviewed. Constitutional: She is oriented to person, place, and time. She appears well-developed and well-nourished. No distress.  HENT:  Head: Normocephalic and atraumatic.  Nose: Nose normal.  Mouth/Throat: Oropharynx is clear and moist. No oropharyngeal exudate.  Eyes: Conjunctivae and EOM are normal. Pupils are equal, round, and reactive to light.  Neck: Normal range of motion. Neck supple.  Cardiovascular: Normal rate and regular rhythm.  Exam reveals no gallop and no friction rub.   No murmur heard. Pulmonary/Chest: Effort normal and breath sounds normal. No respiratory distress. She has no wheezes. She has no rales.  Abdominal: Soft. Bowel sounds are normal. She exhibits no distension. There is no hepatosplenomegaly. There is no tenderness. There is no rigidity, no rebound, no guarding, no tenderness at McBurney's point and negative Murphy's sign.  Musculoskeletal: Normal range of motion. She exhibits no edema.  Neurological: She is alert and oriented to person, place, and time.  Skin: Skin  is warm and dry. No rash noted. She is not diaphoretic.  Psychiatric: She has a normal mood and affect. Her behavior is normal.    ED Course  Procedures (including critical care time) Labs Review Labs Reviewed  CBC WITH DIFFERENTIAL - Abnormal; Notable for the following:    Hemoglobin 10.9 (*)    HCT 34.2 (*)    MCH 25.5 (*)    All other components within normal limits  COMPREHENSIVE METABOLIC PANEL - Abnormal; Notable for the following:    GFR calc non Af Amer 89 (*)    All other components within normal limits  URINALYSIS, ROUTINE W REFLEX MICROSCOPIC - Abnormal; Notable for the  following:    APPearance CLOUDY (*)    All other components within normal limits  LIPASE, BLOOD  POCT PREGNANCY, URINE   Imaging Review No results found.  EKG Interpretation   None       MDM   1. Epigastric abdominal pain   2. Nausea & vomiting    Patient afebrile with Normal VS.  Anemia - appears stable Lipase negative UA normal Urine preg negative.   ABdominal exam benign. Patient reevaluated and pain improved. Nausea resolved. Patient able to tolerate POs.  Plan to treat patient's symptoms and have follow up with PCP in 2 days. Patient agrees with plan. Return precautions given.   Meds given in ED:  Medications  0.9 %  sodium chloride infusion ( Intravenous New Bag/Given 08/27/13 1141)  ondansetron (ZOFRAN) injection 4 mg (4 mg Intravenous Given 08/27/13 1211)  fentaNYL (SUBLIMAZE) injection 50 mcg (50 mcg Intravenous Given 08/27/13 1211)    New Prescriptions   OMEPRAZOLE (PRILOSEC) 20 MG CAPSULE    Take 1 capsule (20 mg total) by mouth daily.   PROMETHAZINE (PHENERGAN) 25 MG SUPPOSITORY    Place 1 suppository (25 mg total) rectally every 6 (six) hours as needed for nausea or vomiting.   PROMETHAZINE (PHENERGAN) 25 MG TABLET    Take 1 tablet (25 mg total) by mouth every 6 (six) hours as needed for nausea or vomiting.       Rudene Anda, PA-C 08/27/13 [phone]

## 2013-08-27 NOTE — ED Notes (Signed)
MD at bedside. EDPA Jillyn HiddenGary

## 2013-08-27 NOTE — ED Notes (Signed)
Pt presents with NAD. Pt c/o N/V. Epigastric pain since 7am. Vomiting 10 episodes .denies urinary problems. Denies vaginal discharge. Denies anyone sick with same symptoms. Denies fever.

## 2013-08-27 NOTE — Progress Notes (Signed)
[facility] provided pt with a list of primary care resources, [facility] information, and a [facility] [employer] Card application.  °

## 2013-08-27 NOTE — Discharge Instructions (Signed)
Follow up with a primary care doctor in 2 days to insure symptom improvement. Take nausea medication as directed. Take reflux medication daily as directed. Return to ED should you develop worsening abdominal pain, intractable vomiting, or fever/chills. Slowly progress diet from clear liquids to solids as tolerated. Resource guide provided below for follow up reference.    Emergency Department Resource Guide 1) Find a Doctor and Pay Out of Pocket Although you won't have to find out who is covered by your insurance plan, it is a good idea to ask around and get recommendations. You will then need to call the office and see if the doctor you have chosen will accept you as a new patient and what types of options they offer for patients who are self-pay. Some doctors offer discounts or will set up payment plans for their patients who do not have insurance, but you will need to ask so you aren't surprised when you get to your appointment.  2) Contact Your Local Health Department Not all health departments have doctors that can see patients for sick visits, but many do, so it is worth a call to see if yours does. If you don't know where your local health department is, you can check in your phone book. The CDC also has a tool to help you locate your state's health department, and many state websites also have listings of all of their local health departments.  3) Find a Walk-in Clinic If your illness is not likely to be very severe or complicated, you may want to try a walk in clinic. These are popping up all over the country in pharmacies, drugstores, and shopping centers. They're usually staffed by nurse practitioners or physician assistants that have been trained to treat common illnesses and complaints. They're usually fairly quick and inexpensive. However, if you have serious medical issues or chronic medical problems, these are probably not your best option.  No Primary Care Doctor: - Call Health Connect  at  [phone] - they can help you locate a primary care doctor that  accepts your insurance, provides certain services, etc. - Physician Referral Service- [phone]  Chronic Pain Problems: Organization         Address  Phone   Notes  [facility]  [phone] Patients need to be referred by their primary care doctor.   Medication Assistance: Organization         Address  Phone   Notes  [facility] [address] [place], [place] [address] [phone] --Must be a resident of [place] -- Must have NO insurance coverage whatsoever (no Medicaid/ Medicare, etc.) -- The pt. MUST have a primary care doctor that directs their care regularly and follows them in the community   MedAssist  [phone]   [employer]  [phone]    Agencies that provide inexpensive medical care: Organization         Address  Phone   Notes  [facility]  [phone]   [facility]    [phone]   [facility] [address] [place], [place] [address] [phone]   [facility] [address], [place] [phone]   [facility]anned Parenthood    [phone](336) [phone]   [facility]    [phone]) [phone]   [facility]  [address], [place] Phone:  [phone]) [phone], Fax:  [phone] Hours of Operation:  9 am - 6 pm, M-F.  Also accepts Medicaid/Medicare and self-pay.  [facility] [place] [address], [address], [place] Phone: [phone], Fax: [phone]. Hours of Operation:  8:30 am - 5:30 pm, M-F.  Also accepts Medicaid and self-pay.  [facility] [place] [address] [place] Phone: [phone]   [place], [place], [place] [phone], Ext. [phone] Mondays & Thursdays: 7-9 AM.  First 15 patients are seen on a first come, first serve basis.     [place] Providers:  Organization         Address  Phone   Notes  [facility] [address], [place] [phone] Also accepts self-pay patients.  [facility] [address], [place]  [phone]   [place], [address], [place] [phone]   [facility] [address], [place] [phone]   Lucianne Lei [address], [place]   [phone] Only accepts [place] Access [place] patients after they have their name applied to their card.   Self-Pay (no insurance) in [facility]:  Organization         Address  Phone   Notes  Sickle Cell Patients, [facility] [place] [phone]   [facility] [place] [phone]   [facility] [place]  [place], [place], [place] [phone]   [facility]/Dr. Osei-Bonsu  [address] [place] or [address], [place] [phone] Phone number for both [place] and [place] locations is the same.  [facility] (1-Rh) [address], [place] [phone]   [facility] [address], [place] or [address] [phone]   [facility] [address] [place] [phone], phone; [phone], fax Sees patients 1st and 3rd Saturday of every month.  Must not qualify for public or private insurance (i.e. Medicaid, Medicare, [place] Health Choice, Veterans' Benefits)  Household income should be no more than 200% of the poverty level The clinic cannot treat you if you are pregnant or think you are pregnant  Sexually transmitted diseases are not treated at the clinic.    Dental Care: Organization         Address  Phone  Notes  [facility]  Department of [facility] [phone] Accepts children up to age 76 who are enrolled in [place] or [place]; pregnant women with a Medicaid card; and children who have applied for Medicaid or [place] Health Choice, but were declined, whose parents can pay a reduced fee at time of service.  [facility] of [facility]  [address], [place] [phone] Accepts children up to age 62 who are enrolled in [place] or [place]; pregnant women with a Medicaid card; and children who have applied for Medicaid or [place] Health Choice, but were declined, whose parents can pay a reduced fee at time of service.  [facility] Access PROGRAM  [place] [phone] Patients are seen by appointment only. Walk-ins are not accepted. [place]  [facility]ental will see patients 76 years of age and older. Monday - Tuesday (8am-5pm) Most Wednesdays (8:30-5pm) $30 per visit, cash only  [facility] Access PROGRAM  [address], [facility] [phone] Patients are seen by appointment only. Walk-ins are not accepted. [place] will see patients 23 years of age and older. One Wednesday Evening (Monthly: Volunteer Based).  $30 per visit, cash only  [place]  [phone] for adults; Children under age 29, call Graduate Pediatric Dentistry at [phone]. Children aged 14-14, please call [phone] to request a pediatric application.  Dental services are provided in all areas of dental care including fillings, crowns and bridges, complete and partial dentures, implants, gum treatment, root canals, and extractions. Preventive care is also provided. Treatment is provided to both adults and children. Patients are selected via a lottery and there is often a waiting list.   [facility] [address] [place]  [phone]  [email or web address]   [facility] [address] [place], [place] [phone], Ext. [phone] Second and Fourth Thursday of each month, opens at 6:30 AM; Clinic ends at 9 AM.  Patients are seen on a first-come first-served basis, and a limited number are seen during each clinic.   [facility]  [address] Hillard Danker [place], [place] [phone]   Eligibility Requirements You must have lived in [place], [place], or [place] counties for at least the last three months.   You cannot be eligible for state or federal sponsored [employer], including [employer], [place], or [employer].   You generally cannot be eligible for healthcare insurance through your employer.    How to apply: Eligibility screenings are held every Tuesday and Wednesday afternoon from 1:00 pm until 4:00 pm. You do not need an appointment for the interview!  [facility] [address] [place], [place]   [place]  [name] Department  [place]  [phone]    Behavioral Health Resources in the Community: Intensive Outpatient Programs Organization         Address  Phone  Notes  [place]. [address], [place], [place] [phone]   [facility] Outpatient [address] [place], [place]   ADS: Alcohol & Drug Svcs [address], [place], [place]   [place] [address]  [place], [place] or [phone]   Substance Abuse Resources Organization         Address  Phone  Notes  Alcohol and Drug Services  [phone]   [place]  [phone]   The [place]   Chinita Pester  [phone]   Residential & Outpatient Substance Abuse Program  [phone]   Psychological Services Organization          Address  Phone  Notes  [facility] [place]  [place]  [phone]   [place] [address] [place] or [phone]    Mobile Crisis Teams Organization         Address  Phone  Notes  [facility], Mobile Crisis Care Unit  [phone]   [facility]  [address] [place], [place]   [facility] [address] [place] [phone]    Self-Help/Support Groups Organization  Address  Phone             Notes  [place]. of [place] - variety of support groups  [place] Call for more information  Narcotics Anonymous (NA), [facility] [address], [employer] [place]  2 meetings at this location   Special educational needs teacher         Address  Phone  Notes  [facility] Residential Treatment [place],    [place]  [phone]   [facility]  [address] [place] [phone], [place], [place]   [place], [place] [phone] Admissions: 8am-3pm M-F  Incentives Substance [place] [address]    [place], [place] [phone]   [facility] [address] [place], [place], [place]   [facility] [address]  [place], [place]   [facility] - Intensive Outpatient [address], [place], [place]   [facility] ([place].) [place].,  [place], [place] [phone] or [phone]   [facility]idential [facility] Services (RTS) [address] [place], [place] Accepts Medicaid  [facility] [place] [address],  [place] [phone] Substance Abuse/Addiction Treatment   [facility] Organization         Address  Phone  Notes  [facility]  [phone]   Domenic Schwab, PhD [address] [place]lis [place], [place]   [phone] or [phone]   [place], [place] [phone]   [facility] [address], [place], [place] [phone] Insurance/Medicaid/sponsorship through [facility] and Families [address], [address] [place]                                    [place], [place] [phone] [place] [address], [place] [phone]    Dr. Adele Schilder  [phone]   [facility] Dept. 1) [address] [place] 2) [place] 3)  [place] [address], [place] [phone]  [phone]   [place] [phone] or [phone] (After Hours)

## 2013-08-29 NOTE — ED Provider Notes (Signed)
Medical screening examination/treatment/procedure(s) were conducted as a shared visit with non-physician practitioner(s) or resident and myself. I personally evaluated the patient during the encounter and agree with the findings and plan unless otherwise indicated.  I have personally reviewed any xrays and/ or EKG's with the provider and I agree with interpretation.  Epig pain since 7 am, recurrent vomiting. No sick contacts. No GB or ulcer hx. No fevers. No new foods. She ate shrimp and rice last night. Exam mild epig pain, mild dry mm, no guarding, well appearing otherwise. Plan for abdo labs, nausea meds, bedside US for gallstones.  EMERGENCY DEPARTMENT BILIARY ULTRASOUND INTERPRETATION  "Study: Limited Abdominal Ultrasound of the gallbladder and common bile duct."  INDICATIONS: Abdominal pain and Vomiting  Indication: Multiple views of the gallbladder and common bile duct were obtained in real-time with a Multi-frequency probe."  PERFORMED BY: Myself  IMAGES ARCHIVED?: Yes  FINDINGS: Gallstones absent, Gallbladder wall normal in thickness, Sonographic Murphy's sign absent and Common bile duct normal in size  LIMITATIONS: Bowel Gas  INTERPRETATION: Normal  Pt improved on recheck. Fup outpt discussed.  Epig pain, Vomiting, Dehydration   Enid SkeensJoshua M Remmi Armenteros, MD 08/29/13 [phone]

## 2013-12-27 ENCOUNTER — Inpatient Hospital Stay (HOSPITAL_COMMUNITY)
Admission: AD | Admit: 2013-12-27 | Discharge: 2013-12-27 | Disposition: A | Discharge status: Home / Self Care | Payer: Self-pay | Source: Ambulatory Visit | Attending: Obstetrics & Gynecology | Admitting: Obstetrics & Gynecology

## 2013-12-27 ENCOUNTER — Inpatient Hospital Stay (HOSPITAL_COMMUNITY): Payer: Self-pay

## 2013-12-27 ENCOUNTER — Encounter (HOSPITAL_COMMUNITY): Payer: Self-pay | Admitting: *Deleted

## 2013-12-27 DIAGNOSIS — K219 Gastro-esophageal reflux disease without esophagitis: Secondary | ICD-10-CM | POA: Insufficient documentation

## 2013-12-27 DIAGNOSIS — O9989 Other specified diseases and conditions complicating pregnancy, childbirth and the puerperium: Secondary | ICD-10-CM

## 2013-12-27 DIAGNOSIS — O99891 Other specified diseases and conditions complicating pregnancy: Secondary | ICD-10-CM | POA: Insufficient documentation

## 2013-12-27 DIAGNOSIS — R109 Unspecified abdominal pain: Secondary | ICD-10-CM | POA: Insufficient documentation

## 2013-12-27 DIAGNOSIS — O26899 Other specified pregnancy related conditions, unspecified trimester: Secondary | ICD-10-CM

## 2013-12-27 LAB — CBC WITH DIFFERENTIAL/PLATELET
BASOS PCT: 1 % (ref 0–1)
Basophils Absolute: 0 10*3/uL (ref 0.0–0.1)
Eosinophils Absolute: 0.4 10*3/uL (ref 0.0–0.7)
Eosinophils Relative: 8 % — ABNORMAL HIGH (ref 0–5)
HEMATOCRIT: 30 % — AB (ref 36.0–46.0)
HEMOGLOBIN: 9.6 g/dL — AB (ref 12.0–15.0)
LYMPHS PCT: 46 % (ref 12–46)
Lymphs Abs: 2.7 10*3/uL (ref 0.7–4.0)
MCH: 25.4 pg — ABNORMAL LOW (ref 26.0–34.0)
MCHC: 32 g/dL (ref 30.0–36.0)
MCV: 79.4 fL (ref 78.0–100.0)
MONO ABS: 0.4 10*3/uL (ref 0.1–1.0)
MONOS PCT: 8 % (ref 3–12)
NEUTROS ABS: 2.1 10*3/uL (ref 1.7–7.7)
NEUTROS PCT: 37 % — AB (ref 43–77)
Platelets: 417 10*3/uL — ABNORMAL HIGH (ref 150–400)
RBC: 3.78 MIL/uL — AB (ref 3.87–5.11)
RDW: 16.2 % — ABNORMAL HIGH (ref 11.5–15.5)
WBC: 5.6 10*3/uL (ref 4.0–10.5)

## 2013-12-27 LAB — WET PREP, GENITAL
Trich, Wet Prep: NONE SEEN
YEAST WET PREP: NONE SEEN

## 2013-12-27 LAB — URINALYSIS, ROUTINE W REFLEX MICROSCOPIC
Bilirubin Urine: NEGATIVE
GLUCOSE, UA: NEGATIVE mg/dL
HGB URINE DIPSTICK: NEGATIVE
Ketones, ur: NEGATIVE mg/dL
LEUKOCYTES UA: NEGATIVE
Nitrite: NEGATIVE
PH: 7.5 (ref 5.0–8.0)
Protein, ur: NEGATIVE mg/dL
Specific Gravity, Urine: 1.015 (ref 1.005–1.030)
Urobilinogen, UA: 0.2 mg/dL (ref 0.0–1.0)

## 2013-12-27 LAB — HCG, QUANTITATIVE, PREGNANCY: hCG, Beta Chain, Quant, S: 15802 m[IU]/mL — ABNORMAL HIGH (ref <5)

## 2013-12-27 LAB — POCT PREGNANCY, URINE: PREG TEST UR: POSITIVE — AB

## 2013-12-27 NOTE — Discharge Instructions (Signed)
Your ultrasound today shows an early gestational sac but no fetus. With your LMP date we should see a fetus at this time. We will have you return in 10 days for follow up to see if the pregnancy progresses. Return sooner for any problems.

## 2013-12-27 NOTE — MAU Provider Note (Signed)
CSN: [id number]     Arrival date & time 12/27/13  1442 History   None    Chief Complaint  Patient presents with  . Possible Pregnancy  . Abdominal Cramping     (Consider location/radiation/quality/duration/timing/severity/associated sxs/prior Treatment) Patient is a 33 y.o. female presenting with pregnancy problem and cramps. The history is provided by the patient.  Possible Pregnancy This is a new problem. The current episode started in the past 7 days. The problem occurs intermittently. The problem has been gradually worsening. Pertinent negatives include no anorexia, change in bowel habit, chest pain, chills, coughing, fever, headaches, nausea, rash, urinary symptoms or vomiting. Abdominal pain: cramping.  Abdominal Cramping The primary symptoms of the illness include vaginal discharge. The primary symptoms of the illness do not include fever, nausea, vomiting, dysuria or vaginal bleeding. Abdominal pain: cramping.  The vaginal discharge is not associated with dysuria.  Symptoms associated with the illness do not include chills, anorexia, frequency or back pain.   Jessie Habiger is a 33 y.o. [id number] @ [redacted]w[redacted]d who presents to the MAU with lower abdominal cramping pain that started 2 days ago. She denies bleeding. She does have vaginal discharge. She denies UTI symptoms, nausea, vomiting or other problems.   Past Medical History  Diagnosis Date  . Acid reflux    Past Surgical History  Procedure Laterality Date  . Cesarean section      14years ago   No family history on file. History  Substance Use Topics  . Smoking status: Never Smoker   . Smokeless tobacco: Not on file  . Alcohol Use: No   OB History   Grav Para Term Preterm Abortions TAB SAB Ect Mult Living   4 1 1  2  2   1      Review of Systems  Constitutional: Negative for fever and chills.  HENT: Negative.   Eyes: Negative for itching.  Respiratory: Negative for cough and wheezing.   Cardiovascular: Negative for chest  pain.  Gastrointestinal: Negative for nausea, vomiting, anorexia and change in bowel habit. Abdominal pain: cramping.  Genitourinary: Positive for vaginal discharge. Negative for dysuria, frequency, vaginal bleeding and pelvic pain.  Musculoskeletal: Negative for back pain.  Skin: Negative for rash.  Neurological: Negative for light-headedness and headaches.  Psychiatric/Behavioral: Negative for confusion. The patient is not nervous/anxious.       Allergies  Codeine  Home Medications   Prior to Admission medications   Not on File   BP 109/58  Pulse 100  Temp(Src) 98.1 F (36.7 C) (Oral)  Resp 18  Ht 5' 0.5" (1.537 m)  Wt 157 lb 9.6 oz (71.487 kg)  BMI 30.26 kg/m2  SpO2 100%  LMP 10/27/2013 Physical Exam  Nursing note and vitals reviewed. Constitutional: She is oriented to person, place, and time. She appears well-developed and well-nourished.  HENT:  Head: Normocephalic and atraumatic.  Eyes: Conjunctivae and EOM are normal.  Neck: Normal range of motion. Neck supple.  Cardiovascular: Normal rate.   Pulmonary/Chest: Effort normal.  Abdominal: Soft. There is tenderness in the left lower quadrant. There is no rebound and no guarding.  Genitourinary:  External genitalia without lesions. Cervix inflamed. No CMT, no adnexal tenderness. Uterus slightly enlarged.   Musculoskeletal: Normal range of motion.  Neurological: She is alert and oriented to person, place, and time. No cranial nerve deficit.  Skin: Skin is warm and dry.  Psychiatric: She has a normal mood and affect. Her behavior is normal.   Results for orders placed during  the hospital encounter of 12/27/13 (from the past 24 hour(s))  URINALYSIS, ROUTINE W REFLEX MICROSCOPIC     Status: None   Collection Time    12/27/13  3:36 PM      Result Value Ref Range   Color, Urine YELLOW  YELLOW   APPearance CLEAR  CLEAR   Specific Gravity, Urine 1.015  1.005 - 1.030   pH 7.5  5.0 - 8.0   Glucose, UA NEGATIVE  NEGATIVE  mg/dL   Hgb urine dipstick NEGATIVE  NEGATIVE   Bilirubin Urine NEGATIVE  NEGATIVE   Ketones, ur NEGATIVE  NEGATIVE mg/dL   Protein, ur NEGATIVE  NEGATIVE mg/dL   Urobilinogen, UA 0.2  0.0 - 1.0 mg/dL   Nitrite NEGATIVE  NEGATIVE   Leukocytes, UA NEGATIVE  NEGATIVE  POCT PREGNANCY, URINE     Status: Abnormal   Collection Time    12/27/13  3:41 PM      Result Value Ref Range   Preg Test, Ur POSITIVE (*) NEGATIVE  CBC WITH DIFFERENTIAL     Status: Abnormal   Collection Time    12/27/13  4:23 PM      Result Value Ref Range   WBC 5.6  4.0 - 10.5 K/uL   RBC 3.78 (*) 3.87 - 5.11 MIL/uL   Hemoglobin 9.6 (*) 12.0 - 15.0 g/dL   HCT 45.4 (*) 09.8 - 11.9 %   MCV 79.4  78.0 - 100.0 fL   MCH 25.4 (*) 26.0 - 34.0 pg   MCHC 32.0  30.0 - 36.0 g/dL   RDW 14.7 (*) 82.9 - 56.2 %   Platelets 417 (*) 150 - 400 K/uL   Neutrophils Relative % 37 (*) 43 - 77 %   Neutro Abs 2.1  1.7 - 7.7 K/uL   Lymphocytes Relative 46  12 - 46 %   Lymphs Abs 2.7  0.7 - 4.0 K/uL   Monocytes Relative 8  3 - 12 %   Monocytes Absolute 0.4  0.1 - 1.0 K/uL   Eosinophils Relative 8 (*) 0 - 5 %   Eosinophils Absolute 0.4  0.0 - 0.7 K/uL   Basophils Relative 1  0 - 1 %   Basophils Absolute 0.0  0.0 - 0.1 K/uL  HCG, QUANTITATIVE, PREGNANCY     Status: Abnormal   Collection Time    12/27/13  4:23 PM      Result Value Ref Range   hCG, Beta Chain, Quant, S 15802 (*) <5 mIU/mL  WET PREP, GENITAL     Status: Abnormal   Collection Time    12/27/13  4:35 PM      Result Value Ref Range   Yeast Wet Prep HPF POC NONE SEEN  NONE SEEN   Trich, Wet Prep NONE SEEN  NONE SEEN   Clue Cells Wet Prep HPF POC FEW (*) NONE SEEN   WBC, Wet Prep HPF POC FEW (*) NONE SEEN    ED Course  Procedures  US Ob Comp Less 14 Wks  12/27/2013   CLINICAL DATA:  Pain.  EXAM: OBSTETRIC <14 WK [place] AND TRANSVAGINAL OB US  TECHNIQUE: Both transabdominal and transvaginal ultrasound examinations were performed for complete evaluation of the gestation as well  as the maternal uterus, adnexal regions, and pelvic cul-de-sac. Transvaginal technique was performed to assess early pregnancy.  COMPARISON:  None.  FINDINGS: Intrauterine gestational sac: Visualized.  Yolk sac:  Not present  Embryo:  Not present  MSD:  15  mm   6  w   3  d  Maternal uterus/adnexae: Normal bilateral ovaries. Right corpus luteum. Small subchorionic hematoma. No free fluid in the pelvis.  IMPRESSION: Intrauterine gestational sac without yolk sac identified. Given that the patient is greater than 7 weeks from the last menstrual period, findings are suspicious but not yet definitive for failed pregnancy. Recommend follow-up US in 10-14 days for definitive diagnosis. This recommendation follows SRU consensus guidelines: Diagnostic Criteria for Nonviable Pregnancy Early in the First Trimester. Malva Limes Engl J Med 2013; 161:0960-45; 369:1443-51.   Electronically Signed   By: Annia Beltrew  Davis M.D.   On: 12/27/2013 17:09   [place] Ob Transvaginal  12/27/2013   CLINICAL DATA:  Pain.  EXAM: OBSTETRIC <14 WK US AND TRANSVAGINAL OB US  TECHNIQUE: Both transabdominal and transvaginal ultrasound examinations were performed for complete evaluation of the gestation as well as the maternal uterus, adnexal regions, and pelvic cul-de-sac. Transvaginal technique was performed to assess early pregnancy.  COMPARISON:  None.  FINDINGS: Intrauterine gestational sac: Visualized.  Yolk sac:  Not present  Embryo:  Not present  MSD:  15  mm   6 w   3  d  Maternal uterus/adnexae: Normal bilateral ovaries. Right corpus luteum. Small subchorionic hematoma. No free fluid in the pelvis.  IMPRESSION: Intrauterine gestational sac without yolk sac identified. Given that the patient is greater than 7 weeks from the last menstrual period, findings are suspicious but not yet definitive for failed pregnancy. Recommend follow-up US in 10-14 days for definitive diagnosis. This recommendation follows SRU consensus guidelines: Diagnostic Criteria for Nonviable Pregnancy  Early in the First Trimester. Malva Limes Engl J Med 2013; 409:8119-14; 369:1443-51.   Electronically Signed   By: Annia Beltrew  Davis M.D.   On: 12/27/2013 17:09    MDM  33 y.o. female @ 4476w5d by LMP with lower abdominal cramping x 2 days. I discussed with the patient in detail possibility of failed pregnancy given her LMP and ultrasound results today. She will return in 10 days to repeat the ultrasound. She will return sooner for any problems. I have reviewed this patient's vital signs, nurses notes, appropriate labs and imaging.  I have discussed findings and plan of care with the patient and she voices understanding and agrees with plan. Stable for discharge without pain or bleeding at this time.

## 2013-12-27 NOTE — MAU Note (Signed)
Patient states she has had 2 positive home pregnancy tests. Has had lower abdominal cramping since 6-3. Has a vaginal discharge, no color or irritation. Denies bleeding, nausea or vomiting.

## 2013-12-28 LAB — GC/CHLAMYDIA PROBE AMP
CT PROBE, AMP APTIMA: NEGATIVE
GC Probe RNA: NEGATIVE

## 2014-01-01 ENCOUNTER — Inpatient Hospital Stay (HOSPITAL_COMMUNITY): Payer: Self-pay

## 2014-01-01 ENCOUNTER — Encounter (HOSPITAL_COMMUNITY): Payer: Self-pay | Admitting: *Deleted

## 2014-01-01 ENCOUNTER — Inpatient Hospital Stay (HOSPITAL_COMMUNITY)
Admission: AD | Admit: 2014-01-01 | Discharge: 2014-01-01 | Disposition: A | Discharge status: Home / Self Care | Payer: Self-pay | Source: Ambulatory Visit | Attending: Obstetrics & Gynecology | Admitting: Obstetrics & Gynecology

## 2014-01-01 DIAGNOSIS — R109 Unspecified abdominal pain: Secondary | ICD-10-CM | POA: Insufficient documentation

## 2014-01-01 DIAGNOSIS — K219 Gastro-esophageal reflux disease without esophagitis: Secondary | ICD-10-CM | POA: Insufficient documentation

## 2014-01-01 DIAGNOSIS — O209 Hemorrhage in early pregnancy, unspecified: Secondary | ICD-10-CM

## 2014-01-01 LAB — URINALYSIS, ROUTINE W REFLEX MICROSCOPIC
Bilirubin Urine: NEGATIVE
Glucose, UA: NEGATIVE mg/dL
Ketones, ur: 15 mg/dL — AB
Leukocytes, UA: NEGATIVE
Nitrite: NEGATIVE
PROTEIN: NEGATIVE mg/dL
SPECIFIC GRAVITY, URINE: 1.015 (ref 1.005–1.030)
Urobilinogen, UA: 1 mg/dL (ref 0.0–1.0)
pH: 6.5 (ref 5.0–8.0)

## 2014-01-01 LAB — URINE MICROSCOPIC-ADD ON

## 2014-01-01 LAB — HCG, QUANTITATIVE, PREGNANCY: hCG, Beta Chain, Quant, S: 16525 m[IU]/mL — ABNORMAL HIGH (ref <5)

## 2014-01-01 NOTE — MAU Provider Note (Signed)
History     CSN: [id number]  Arrival date and time: 01/01/14 1545   First Provider Initiated Contact with Patient 01/01/14 1615      Chief Complaint  Patient presents with  . Vaginal Bleeding   HPI Shelley Ramirez 33 y.o. [id number] at [redacted]w[redacted]d who presents with vaginal bleeding and abdominal cramping for past 5 days and back pain starting yesterday. Seen on 6/5 for abdominal cramping and [place] results seen below. Bleeding has been brown-red spotting on pantyliner but today increased in amount. Changed pantyliner 3 times today. Cramping is diffuse and currently rated as a 5/10. Denies nausea and vomiting, vaginal discharge, urinary frequency, dysuria, fever.    12/27/2013 CLINICAL DATA: Pain. EXAM: OBSTETRIC <14 WK [place] AND TRANSVAGINAL OB US TECHNIQUE: Both transabdominal and transvaginal ultrasound examinations were performed for complete evaluation of the gestation as well as the maternal uterus, adnexal regions, and pelvic cul-de-sac. Transvaginal technique was performed to assess early pregnancy. COMPARISON: None. FINDINGS: Intrauterine gestational sac: Visualized. Yolk sac: Not present Embryo: Not present MSD: 15 mm 6 w 3 d Maternal uterus/adnexae: Normal bilateral ovaries. Right corpus luteum. Small subchorionic hematoma. No free fluid in the pelvis. IMPRESSION: Intrauterine gestational sac without yolk sac identified. Given that the patient is greater than 7 weeks from the last menstrual period, findings are suspicious but not yet definitive for failed pregnancy. Recommend follow-up US in 10-14 days for definitive diagnosis. This recommendation follows SRU consensus guidelines: Diagnostic Criteria for Nonviable Pregnancy Early in the First Trimester. Malva Limes Med 2013; 147:8295-62. Electronically Signed By: Annia Belt M.D. On: 12/27/2013 17:09   OB History   Grav Para Term Preterm Abortions TAB SAB Ect Mult Living   4 1 1  2  2   1       Past Medical History  Diagnosis Date  . Acid reflux      Past Surgical History  Procedure Laterality Date  . Cesarean section      14years ago    History reviewed. No pertinent family history.  History  Substance Use Topics  . Smoking status: Never Smoker   . Smokeless tobacco: Never Used  . Alcohol Use: No    Allergies:  Allergies  Allergen Reactions  . Codeine Shortness Of Breath and Other (See Comments)    Hyperventilation, pass out, loss off consciousness      No prescriptions prior to admission    Review of Systems  Constitutional: Negative for fever.  HENT: Negative.   Eyes: Negative.   Respiratory: Negative.   Cardiovascular: Negative for chest pain.  Gastrointestinal: Positive for abdominal pain. Negative for nausea and vomiting.  Genitourinary:       -vaginal discharge, +vaginal bleeding   Musculoskeletal: Positive for back pain.  Neurological: Negative.   Psychiatric/Behavioral: Negative.    Physical Exam   Blood pressure 121/59, pulse 96, temperature 98.2 F (36.8 C), temperature source Oral, resp. rate 18, last menstrual period 10/27/2013.  Physical Exam  Nursing note and vitals reviewed. Constitutional: She is oriented to person, place, and time. She appears well-developed and well-nourished. No distress.  HENT:  Head: Normocephalic and atraumatic.  Eyes: Pupils are equal, round, and reactive to light.  Neck: Normal range of motion.  Cardiovascular: Normal rate, regular rhythm and normal heart sounds.   Respiratory: Effort normal and breath sounds normal.  GI: Soft. Bowel sounds are normal. She exhibits no mass. There is no tenderness. There is no rebound and no guarding.  Genitourinary:  Cervix: Closed and thick.  Musculoskeletal: Normal range of motion.  Neurological: She is alert and oriented to person, place, and time.  Skin: Skin is warm and dry.  Psychiatric: She has a normal mood and affect. Her behavior is normal. Judgment and thought content normal.   Results for orders placed during  the hospital encounter of 01/01/14 (from the past 48 hour(s))  URINALYSIS, ROUTINE W REFLEX MICROSCOPIC     Status: Abnormal   Collection Time    01/01/14  3:50 PM      Result Value Ref Range   Color, Urine YELLOW  YELLOW   APPearance CLEAR  CLEAR   Specific Gravity, Urine 1.015  1.005 - 1.030   pH 6.5  5.0 - 8.0   Glucose, UA NEGATIVE  NEGATIVE mg/dL   Hgb urine dipstick TRACE (*) NEGATIVE   Bilirubin Urine NEGATIVE  NEGATIVE   Ketones, ur 15 (*) NEGATIVE mg/dL   Protein, ur NEGATIVE  NEGATIVE mg/dL   Urobilinogen, UA 1.0  0.0 - 1.0 mg/dL   Nitrite NEGATIVE  NEGATIVE   Leukocytes, UA NEGATIVE  NEGATIVE  URINE MICROSCOPIC-ADD ON     Status: Abnormal   Collection Time    01/01/14  3:50 PM      Result Value Ref Range   Squamous Epithelial / LPF MANY (*) RARE   WBC, UA 7-10  <3 WBC/hpf   RBC / HPF 3-6  <3 RBC/hpf   Bacteria, UA MANY (*) RARE   Urine-Other MUCOUS PRESENT    HCG, QUANTITATIVE, PREGNANCY     Status: Abnormal   Collection Time    01/01/14  4:25 PM      Result Value Ref Range   hCG, Beta Chain, Quant, S 16525 (*) <5 mIU/mL   Comment:              GEST. AGE      CONC.  (mIU/mL)       <=1 WEEK        5 - 50         2 WEEKS       50 - 500         3 WEEKS       100 - 10,000         4 WEEKS     1,000 - 30,000         5 WEEKS     3,500 - 115,000       6-8 WEEKS     12,000 - 270,000        12 WEEKS     15,000 - 220,000                FEMALE AND NON-PREGNANT FEMALE:         LESS THAN 5 mIU/mL   [place] Ob Transvaginal  01/01/2014   CLINICAL DATA:  Vaginal bleeding  EXAM: TRANSVAGINAL OB ULTRASOUND  TECHNIQUE: Transvaginal ultrasound was performed for complete evaluation of the gestation as well as the maternal uterus, adnexal regions, and pelvic cul-de-sac.  COMPARISON:  December 27, 2013  FINDINGS: Intrauterine gestational sac: Visualized/normal in shape.  Yolk sac:  Not visualized  Embryo:  Not visualized  MSD: 21  mm   7 w   1  d  Maternal uterus/adnexae: There is a small  subchorionic hemorrhage measuring 1.6 x 0.4 cm. Maternal uterus otherwise appears normal. Maternal adnexal structures appear normal without mass or free fluid.  IMPRESSION: Gestational sac size indicates [redacted] week gestation, but no yolk sac or embryo  seen. Findings are suspicious but not yet definitive for failed pregnancy. Recommend follow-up US in 10-14 days for definitive diagnosis. This recommendation follows SRU consensus guidelines: Diagnostic Criteria for Nonviable Pregnancy Early in the First Trimester. Malva Limes Med 2013; 932:3557-32. There is a small subchorionic hemorrhage. No maternal extrauterine mass or free fluid.   Electronically Signed   By: Bretta Bang M.D.   On: 01/01/2014 19:15   MAU Course  Procedures  MDM UA, quant hCG Given inappropriate rise of hCG, will obtained transvaginal US to r/o ectopic   Assessment and Plan  A: IUGS at [redacted]w[redacted]d without yolk sac or embryo    P: Repeat ultrasound in 10 days  Return to MAU as needed for increased pain, vaginal bleeding, nausea/vomiting or fever    Sauve, Lauren L 01/01/2014, 4:27 PM   Evaluation and management procedures were performed by PA-S under my supervision/collaboration. Chart reviewed, patient examined by me and I agree with management and plan. Counseled couple on likelihood of EPF, unknown location of pregnancy. [place] scheduled for 11/10/13 Discharged home with ectopic precautions  Danae Orleans, CNM 01/01/2014 8:29 PM

## 2014-01-01 NOTE — MAU Note (Addendum)
Known gestational sac, unconfirmed viablilty. Small amount bleeding today, cramping off and on, varies in intensity. Has F/U U/S scheduled 6/15.

## 2014-01-01 NOTE — Discharge Instructions (Signed)
Pelvic Rest °Pelvic rest is sometimes recommended for women when:  °· The placenta is partially or completely covering the opening of the cervix (placenta previa). °· There is bleeding between the uterine wall and the amniotic sac in the first trimester (subchorionic hemorrhage). °· The cervix begins to open without labor starting (incompetent cervix, cervical insufficiency). °· The labor is too early (preterm labor). °HOME CARE INSTRUCTIONS °· Do not have sexual intercourse, stimulation, or an orgasm. °· Do not use tampons, douche, or put anything in the vagina. °· Do not lift anything over 10 pounds (4.5 kg). °· Avoid strenuous activity or straining your pelvic muscles. °SEEK MEDICAL CARE IF:  °· You have any vaginal bleeding during pregnancy. Treat this as a potential emergency. °· You have cramping pain felt low in the stomach (stronger than menstrual cramps). °· You notice vaginal discharge (watery, mucus, or bloody). °· You have a low, dull backache. °· There are regular contractions or uterine tightening. °SEEK IMMEDIATE MEDICAL CARE IF: °You have vaginal bleeding and have placenta previa.  °Document Released: 11/05/2010 Document Revised: 10/03/2011 Document Reviewed: 11/05/2010 °ExitCare® Patient Information ©2014 [employer]. ° °

## 2014-01-02 NOTE — MAU Provider Note (Signed)
Attestation of Attending Supervision of Advanced Practitioner: Evaluation and management procedures were performed by the PA/NP/CNM/OB Fellow under my supervision/collaboration. Chart reviewed and agree with management and plan.  Brentney Goldbach V 01/02/2014 5:33 AM

## 2014-01-06 ENCOUNTER — Ambulatory Visit (HOSPITAL_COMMUNITY): Payer: Self-pay

## 2014-01-09 ENCOUNTER — Encounter (HOSPITAL_COMMUNITY): Payer: Self-pay | Admitting: *Deleted

## 2014-01-09 ENCOUNTER — Inpatient Hospital Stay (HOSPITAL_COMMUNITY)
Admission: AD | Admit: 2014-01-09 | Discharge: 2014-01-09 | Disposition: A | Discharge status: Home / Self Care | Payer: Self-pay | Source: Ambulatory Visit | Attending: Obstetrics & Gynecology | Admitting: Obstetrics & Gynecology

## 2014-01-09 ENCOUNTER — Inpatient Hospital Stay (HOSPITAL_COMMUNITY): Payer: Self-pay

## 2014-01-09 DIAGNOSIS — O020 Blighted ovum and nonhydatidiform mole: Secondary | ICD-10-CM

## 2014-01-09 DIAGNOSIS — O0289 Other abnormal products of conception: Secondary | ICD-10-CM

## 2014-01-09 DIAGNOSIS — O208 Other hemorrhage in early pregnancy: Secondary | ICD-10-CM | POA: Insufficient documentation

## 2014-01-09 LAB — CBC
HEMATOCRIT: 29.7 % — AB (ref 36.0–46.0)
Hemoglobin: 9.6 g/dL — ABNORMAL LOW (ref 12.0–15.0)
MCH: 25.5 pg — AB (ref 26.0–34.0)
MCHC: 32.3 g/dL (ref 30.0–36.0)
MCV: 79 fL (ref 78.0–100.0)
PLATELETS: 351 10*3/uL (ref 150–400)
RBC: 3.76 MIL/uL — ABNORMAL LOW (ref 3.87–5.11)
RDW: 15.8 % — ABNORMAL HIGH (ref 11.5–15.5)
WBC: 6.4 10*3/uL (ref 4.0–10.5)

## 2014-01-09 LAB — HCG, QUANTITATIVE, PREGNANCY: hCG, Beta Chain, Quant, S: 12963 m[IU]/mL — ABNORMAL HIGH (ref <5)

## 2014-01-09 MED ORDER — ACETAMINOPHEN 500 MG PO TABS
1000.0000 mg | ORAL_TABLET | ORAL | Status: AC
  Administered 2014-01-09: 1000 mg via ORAL
  Filled 2014-01-09: qty 2

## 2014-01-09 NOTE — Discharge Instructions (Signed)
Blighted Ovum A blighted ovum (anembryonic pregnancy) happens when a fertilized egg (embryo) attaches itself to the uterine wall, but the embryo does not develop. The pregnancy sac (placenta) continues to grow even though the embryo does not grow and develop.The pregnancy hormone is still secreted because the placenta has formed. This will result in a positive pregnancy test despite having an abnormal pregnancy. A blighted ovum occurs within the first trimester, sometimes before a woman knows she is pregnant.  CAUSES A blighted ovum is usually the result of chromosomal problems. This can be caused by abnormal cell division or poor quality sperm or egg. SYMPTOMS Early on, signs of pregnancy may be experienced, such as:  A missed menstrual period.  Fatigue.  Feeling sick to your stomach (nauseous).  Sore breasts.  A positive pregnancy test. Then, signs of miscarriage may develop, such as:  Abdominal cramps.  Vaginal bleeding or spotting.  A menstrual period that is heavier than usual. DIAGNOSIS The diagnosis of a blighted ovum is made with an ultrasound test that shows an empty uterus or an empty gestational sac. TREATMENT Your caregiver will help you decide what the best treatment is for you. Treatment for a blighted ovum includes:   Letting your body naturally pass the tissue of a blighted ovum.  Taking medicine to trigger the miscarriage.  Having a procedure called a dilation and curettage (D&C) to remove the placental tissues.  A D&C may be helpful if you would like the tissue examined to determine the reason for a miscarriage. Talk to your caregiver about the risks involved with this procedure. HOME CARE INSTRUCTIONS   Follow up with your caregiver to make sure that your pregnancy hormone returns to zero.  Wait at least 1 to 3 regular menstrual cycles before trying to get pregnant again, or as recommended by your caregiver. SEEK IMMEDIATE MEDICAL CARE IF:  You have  worsening abdominal pain.  You have very heavy bleeding or use 1 to 2 pads every hour, for more than 2 hours.  You are dizzy, feel faint, or pass out. Document Released: 10/26/2010 Document Revised: 10/03/2011 Document Reviewed: 10/26/2010 ExitCare Patient Information 2015 [employer]. This information is not intended to replace advice given to you by your health care provider. Make sure you discuss any questions you have with your health care provider.  

## 2014-01-09 NOTE — MAU Note (Signed)
Pt states she started having 'real heavy bleeding yesterday and continues today" Pt states she went from wearing a panty liner to wearing a pad. Pt also states she has been bleeding for weeks.

## 2014-01-09 NOTE — Progress Notes (Signed)
Pt questioning the vaginal bleeding and continued need for reevaluation in 2 wks. Dr Debroah LoopArnold came to bedside further explained care plan and evaluation of blood work and ultrasound.

## 2014-01-09 NOTE — MAU Note (Signed)
Pt reports she has been bleeding for several weeks. Had U/S  Did not show fetus. Pt stated bleeding is heavier the last 2 days more like if she is having her period.. Had cramping yesterday none today.

## 2014-01-09 NOTE — MAU Provider Note (Signed)
History     CSN: [id number]  Arrival date and time: 01/09/14 1630   First Provider Initiated Contact with Patient 01/09/14 1701      Chief Complaint  Patient presents with  . Vaginal Bleeding   HPI Shelley Ramirez 33 y.o. [id number] at 3029w4d who presents with vaginal bleeding that has worsened in the past 24 hours. Seen on 6/5 and 6/10 for similar complaints. Quant hCG and ultrasound results were suspicious but not definitive for a failed pregnancy. No abdominal pain today. Bleeding has been brown-red spotting on pantyliner but today increased in amount so that she has "soaked" two pads in past 24 hours. Denies nausea and vomiting, vaginal discharge, urinary frequency, dysuria, fever.    OB History   Grav Para Term Preterm Abortions TAB SAB Ect Mult Living   4 1 1  2  2   1       Past Medical History  Diagnosis Date  . Acid reflux     Past Surgical History  Procedure Laterality Date  . Cesarean section      14years ago    Family History  Problem Relation Age of Onset  . Kidney disease Mother   . Cancer Maternal Aunt   . Diabetes Maternal Aunt     History  Substance Use Topics  . Smoking status: Never Smoker   . Smokeless tobacco: Never Used  . Alcohol Use: No    Allergies:  Allergies  Allergen Reactions  . Codeine Shortness Of Breath and Other (See Comments)    Hyperventilation, pass out, loss off consciousness      Prescriptions prior to admission  Medication Sig Dispense Refill  . aspirin-acetaminophen-caffeine (EXCEDRIN MIGRAINE) 250-250-65 MG per tablet Take 2 tablets by mouth every 6 (six) hours as needed for headache.        Review of Systems  Constitutional: Negative for fever.  HENT: Negative.   Cardiovascular: Negative.   Gastrointestinal: Negative for nausea, vomiting and abdominal pain.  Genitourinary:       + vaginal bleeding  Neurological: Negative.   All other systems reviewed and are negative.  Physical Exam   Blood pressure 109/61,  pulse 88, temperature 98.3 F (36.8 C), resp. rate 18, height 5' (1.524 m), weight 72.576 kg (160 lb), last menstrual period 10/27/2013.  Physical Exam  Nursing note and vitals reviewed. Constitutional: She is oriented to person, place, and time. She appears well-developed and well-nourished. No distress.  HENT:  Head: Normocephalic and atraumatic.  Eyes: Pupils are equal, round, and reactive to light.  Neck: Normal range of motion.  Cardiovascular: Normal rate.   Respiratory: Effort normal.  Genitourinary:  Internal exam deferred. No bleeding present externally.  Musculoskeletal: Normal range of motion.  Neurological: She is alert and oriented to person, place, and time.  Skin: Skin is warm and dry.  Psychiatric: She has a normal mood and affect. Her behavior is normal. Judgment and thought content normal.   Results for orders placed during the hospital encounter of 01/09/14 (from the past 48 hour(s))  CBC     Status: Abnormal   Collection Time    01/09/14  5:30 PM      Result Value Ref Range   WBC 6.4  4.0 - 10.5 K/uL   RBC 3.76 (*) 3.87 - 5.11 MIL/uL   Hemoglobin 9.6 (*) 12.0 - 15.0 g/dL   HCT 14.729.7 (*) 82.936.0 - 56.246.0 %   MCV 79.0  78.0 - 100.0 fL   MCH 25.5 (*) 26.0 -  34.0 pg   MCHC 32.3  30.0 - 36.0 g/dL   RDW 16.1 (*) 09.6 - 04.5 %   Platelets 351  150 - 400 K/uL  HCG, QUANTITATIVE, PREGNANCY     Status: Abnormal   Collection Time    01/09/14  5:30 PM      Result Value Ref Range   hCG, Beta Chain, Quant, S 40981 (*) <5 mIU/mL   Comment:              GEST. AGE      CONC.  (mIU/mL)       <=1 WEEK        5 - 50         2 WEEKS       50 - 500         3 WEEKS       100 - 10,000         4 WEEKS     1,000 - 30,000         5 WEEKS     3,500 - 115,000       6-8 WEEKS     12,000 - 270,000        12 WEEKS     15,000 - 220,000                FEMALE AND NON-PREGNANT FEMALE:         LESS THAN 5 mIU/mL     Ultrasound at today's MAU visit: US Ob Transvaginal  01/09/2014    CLINICAL DATA:  Vaginal bleeding.  Pregnant.  EXAM: TRANSVAGINAL OB ULTRASOUND  TECHNIQUE: Transvaginal ultrasound was performed for complete evaluation of the gestation as well as the maternal uterus, adnexal regions, and pelvic cul-de-sac.  COMPARISON:  01/01/2014  FINDINGS: Intrauterine gestational sac: Somewhat irregular gestational sac.  Yolk sac:  No  Embryo:  No  Cardiac Activity: No  MSD: 18.7  mm   6 w   6  d  Maternal uterus/adnexae: Minimal subchorionic hemorrhage. No uterine mass. Right ovary visualized. A probable corpus luteal cyst lies within the right ovary. Left ovary not visualized. No adnexal masses. No free fluid.  IMPRESSION: 1. Gestational sac size has not increased. There is still no embryo or yolk sac. This is highly suspicious for a failed intrauterine pregnancy, but not yet conclusive. Consensus criteria for definitive failed intrauterine pregnancy is a followup scan greater than or equal to 2 weeks after a scan that showed a gestational sac with no embryo or yolk sac, with a sac size of 16-24 mm in mean diameter. Findings are suspicious but not yet definitive for failed pregnancy. Recommend follow-up US in 10-14 days for definitive diagnosis. This recommendation follows SRU consensus guidelines: Diagnostic Criteria for Nonviable Pregnancy Early in the First Trimester. Malva Limes Med 2013; 191:4782-95.   Electronically Signed   By: Amie Portland M.D.   On: 01/09/2014 18:14   Previous U/S findings: US Ob Comp Less 14 Wks  12/27/2013   CLINICAL DATA:  Pain.  EXAM: OBSTETRIC <14 WK [place] AND TRANSVAGINAL OB US  TECHNIQUE: Both transabdominal and transvaginal ultrasound examinations were performed for complete evaluation of the gestation as well as the maternal uterus, adnexal regions, and pelvic cul-de-sac. Transvaginal technique was performed to assess early pregnancy.  COMPARISON:  None.  FINDINGS: Intrauterine gestational sac: Visualized.  Yolk sac:  Not present  Embryo:  Not present  MSD:   15  mm   6 w   3  d  Maternal uterus/adnexae: Normal bilateral ovaries. Right corpus luteum. Small subchorionic hematoma. No free fluid in the pelvis.  IMPRESSION: Intrauterine gestational sac without yolk sac identified. Given that the patient is greater than 7 weeks from the last menstrual period, findings are suspicious but not yet definitive for failed pregnancy. Recommend follow-up US in 10-14 days for definitive diagnosis. This recommendation follows SRU consensus guidelines: Diagnostic Criteria for Nonviable Pregnancy Early in the First Trimester. Malva Limes Engl J Med 2013; 161:0960-45; 369:1443-51.   Electronically Signed   By: Annia Beltrew  Davis M.D.   On: 12/27/2013 17:09   [place] Ob Transvaginal  01/01/2014   CLINICAL DATA:  Vaginal bleeding  EXAM: TRANSVAGINAL OB ULTRASOUND  TECHNIQUE: Transvaginal ultrasound was performed for complete evaluation of the gestation as well as the maternal uterus, adnexal regions, and pelvic cul-de-sac.  COMPARISON:  December 27, 2013  FINDINGS: Intrauterine gestational sac: Visualized/normal in shape.  Yolk sac:  Not visualized  Embryo:  Not visualized  MSD: 21  mm   7 w   1  d  Maternal uterus/adnexae: There is a small subchorionic hemorrhage measuring 1.6 x 0.4 cm. Maternal uterus otherwise appears normal. Maternal adnexal structures appear normal without mass or free fluid.  IMPRESSION: Gestational sac size indicates [redacted] week gestation, but no yolk sac or embryo seen. Findings are suspicious but not yet definitive for failed pregnancy. Recommend follow-up US in 10-14 days for definitive diagnosis. This recommendation follows SRU consensus guidelines: Diagnostic Criteria for Nonviable Pregnancy Early in the First Trimester. Malva Limes Engl J Med 2013; 409:8119-14; 369:1443-51. There is a small subchorionic hemorrhage. No maternal extrauterine mass or free fluid.   Electronically Signed   By: Bretta BangWilliam  Woodruff M.D.   On: 01/01/2014 19:15   [place] Ob Transvaginal  12/27/2013   CLINICAL DATA:  Pain.  EXAM: OBSTETRIC <14 WK US AND  TRANSVAGINAL OB US  TECHNIQUE: Both transabdominal and transvaginal ultrasound examinations were performed for complete evaluation of the gestation as well as the maternal uterus, adnexal regions, and pelvic cul-de-sac. Transvaginal technique was performed to assess early pregnancy.  COMPARISON:  None.  FINDINGS: Intrauterine gestational sac: Visualized.  Yolk sac:  Not present  Embryo:  Not present  MSD:  15  mm   6 w   3  d  Maternal uterus/adnexae: Normal bilateral ovaries. Right corpus luteum. Small subchorionic hematoma. No free fluid in the pelvis.  IMPRESSION: Intrauterine gestational sac without yolk sac identified. Given that the patient is greater than 7 weeks from the last menstrual period, findings are suspicious but not yet definitive for failed pregnancy. Recommend follow-up US in 10-14 days for definitive diagnosis. This recommendation follows SRU consensus guidelines: Diagnostic Criteria for Nonviable Pregnancy Early in the First Trimester. Malva Limes Engl J Med 2013; 782:9562-13; 369:1443-51.   Electronically Signed   By: Annia Beltrew  Davis M.D.   On: 12/27/2013 17:09  MAU Course  Procedures  MDM CBC, Quant hCG, Transvaginal US   Assessment and Plan  A: Failed Pregnancy   P: [facility] for repeat hCG Information given about Cytotec  Reviewed signs and amount of bleeding which would concerning and that patient should return Return to MAU as needed for increased pain, vaginal bleeding, nausea/vomiting or fever    Shelley Ramirez, Shelley Ramirez 01/09/2014, 5:28 PM     I have seen this patient and agree with the above resident's note with the following additions.  Consulted Dr Debroah LoopArnold.  Reviewed U/S and quant hcg results with pt.  Offered expectant management or Cytotec to pt.  Pt wants to know why diagnosis definitive today, not definitive at her last visit.  Has questions about Cytotec, why Cytotec vs. D&C.  Pt questions answered.  Dr Debroah Loop also to bedside to answer pt  questions.  D/C home F/U in clinic in 2 weeks Printed materials given about Cytotec  Shelley Ramirez, Civil engineer, contracting Nurse-Midwife

## 2014-01-10 ENCOUNTER — Encounter (HOSPITAL_COMMUNITY): Payer: Self-pay | Admitting: *Deleted

## 2014-01-10 ENCOUNTER — Inpatient Hospital Stay (HOSPITAL_COMMUNITY)
Admission: AD | Admit: 2014-01-10 | Discharge: 2014-01-10 | Disposition: A | Discharge status: Home / Self Care | Payer: Self-pay | Source: Ambulatory Visit | Attending: Obstetrics & Gynecology | Admitting: Obstetrics & Gynecology

## 2014-01-10 DIAGNOSIS — K219 Gastro-esophageal reflux disease without esophagitis: Secondary | ICD-10-CM | POA: Insufficient documentation

## 2014-01-10 DIAGNOSIS — O039 Complete or unspecified spontaneous abortion without complication: Secondary | ICD-10-CM | POA: Insufficient documentation

## 2014-01-10 LAB — HCG, QUANTITATIVE, PREGNANCY: HCG, BETA CHAIN, QUANT, S: 11476 m[IU]/mL — AB (ref <5)

## 2014-01-10 LAB — CBC
HCT: 30.8 % — ABNORMAL LOW (ref 36.0–46.0)
HEMOGLOBIN: 9.9 g/dL — AB (ref 12.0–15.0)
MCH: 25.3 pg — ABNORMAL LOW (ref 26.0–34.0)
MCHC: 32.1 g/dL (ref 30.0–36.0)
MCV: 78.8 fL (ref 78.0–100.0)
Platelets: 396 10*3/uL (ref 150–400)
RBC: 3.91 MIL/uL (ref 3.87–5.11)
RDW: 16 % — ABNORMAL HIGH (ref 11.5–15.5)
WBC: 7 10*3/uL (ref 4.0–10.5)

## 2014-01-10 MED ORDER — HYDROMORPHONE HCL PF 1 MG/ML IJ SOLN
1.0000 mg | Freq: Once | INTRAMUSCULAR | Status: AC
  Administered 2014-01-10: 1 mg via INTRAMUSCULAR
  Filled 2014-01-10: qty 1

## 2014-01-10 MED ORDER — ONDANSETRON 8 MG PO TBDP: 8.0000 mg | ORAL_TABLET | Freq: Three times a day (TID) | ORAL | Status: DC | PRN

## 2014-01-10 MED ORDER — HYDROCODONE-ACETAMINOPHEN 5-325 MG PO TABS: 1.0000 | ORAL_TABLET | ORAL | Status: DC | PRN

## 2014-01-10 NOTE — MAU Note (Signed)
Patient states she was seen last night in MAU with a failed pregnancy. Was offered Cytotec but declined. States she started cramping and bleeding with 2 large blood clots.

## 2014-01-10 NOTE — MAU Provider Note (Signed)
History     CSN: [id number]  Arrival date and time: 01/10/14 1328   None     Chief Complaint  Patient presents with  . Vaginal Bleeding   Vaginal Bleeding    Shelley Ramirez Shelley Ramirez is a 33 y.o. [id number] who was seen yesterday with falling HCG levels. She did not want Cytotec yesterday. She is here today with increased bleeding and cramping. She states that at 0900 she started to have heavy vaginal bleeding, and passed 2 clots at home. She is also having a lot of cramping and pain. She continues to state that she does not want Cytotec today, and that she would prefer surgical intervention over Cytotec.   Past Medical History  Diagnosis Date  . Acid reflux     Past Surgical History  Procedure Laterality Date  . Cesarean section      14years ago    Family History  Problem Relation Age of Onset  . Kidney disease Mother   . Cancer Maternal Aunt   . Diabetes Maternal Aunt     History  Substance Use Topics  . Smoking status: Never Smoker   . Smokeless tobacco: Never Used  . Alcohol Use: No    Allergies:  Allergies  Allergen Reactions  . Codeine Shortness Of Breath and Other (See Comments)    Hyperventilation, pass out, loss off consciousness      Prescriptions prior to admission  Medication Sig Dispense Refill  . aspirin-acetaminophen-caffeine (EXCEDRIN MIGRAINE) 250-250-65 MG per tablet Take 2 tablets by mouth every 6 (six) hours as needed for headache.        Review of Systems  Genitourinary: Positive for vaginal bleeding.   Physical Exam   Blood pressure 97/62, pulse 86, temperature 98.3 F (36.8 C), temperature source Oral, resp. rate 16, last menstrual period 10/27/2013, SpO2 98.00%.  Physical Exam  Nursing note and vitals reviewed. Constitutional: She is oriented to person, place, and time. She appears well-developed and well-nourished. No distress.  Cardiovascular: Normal rate.   Respiratory: Effort normal.  GI: Soft. There is no tenderness. There is no  rebound.  Genitourinary:   External: no lesion Vagina: moderate amount of blood seen in the vagina.  Cervix: pink, smooth, no CMT. Slightly dilated, and small clot seen at the os. No POC seen, and unable to fully remove the clot from the os.  Uterus: slightly enlarged    Neurological: She is alert and oriented to person, place, and time.  Skin: Skin is warm and dry.  Psychiatric: She has a normal mood and affect.    MAU Course  Procedures  Results for orders placed during the hospital encounter of 01/10/14 (from the past 24 hour(s))  CBC     Status: Abnormal   Collection Time    01/10/14  2:40 PM      Result Value Ref Range   WBC 7.0  4.0 - 10.5 K/uL   RBC 3.91  3.87 - 5.11 MIL/uL   Hemoglobin 9.9 (*) 12.0 - 15.0 g/dL   HCT 14.730.8 (*) 82.936.0 - 56.246.0 %   MCV 78.8  78.0 - 100.0 fL   MCH 25.3 (*) 26.0 - 34.0 pg   MCHC 32.1  30.0 - 36.0 g/dL   RDW 13.016.0 (*) 86.511.5 - 78.415.5 %   Platelets 396  150 - 400 K/uL     Assessment and Plan   1. SAB (spontaneous abortion)    Hgb stable at this time RX: vicodin and zofran, patient states that her "allergy" to  codeine is that is causes nausea.  Bleeding precautions Return to MAU as needed  Follow-up Information   [facility] with [facility](MONDAY 01/13/14 at 3:00PM )    Specialty:  Obstetrics and Gynecology   Contact information:   [address] [place] [address] [phone]       Tawnya CrookHogan, Heather Donovan 01/10/2014, 2:19 PM

## 2014-01-10 NOTE — MAU Note (Signed)
On arrival to triage patient was bleeding through her clothing. Taken to her room and helped to undress. Moderate bleeding and passed several moderate size clots. Assisted to clean up and large pad and gown applied.

## 2014-01-10 NOTE — Discharge Instructions (Signed)
Miscarriage A miscarriage is the sudden loss of an unborn baby (fetus) before the 20th week of pregnancy. Most miscarriages happen in the first 3 months of pregnancy. Sometimes, it happens before a woman even knows she is pregnant. A miscarriage is also called a "spontaneous miscarriage" or "early pregnancy loss." Having a miscarriage can be an emotional experience. Talk with your caregiver about any questions you may have about miscarrying, the grieving process, and your future pregnancy plans. CAUSES   Problems with the fetal chromosomes that make it impossible for the baby to develop normally. Problems with the baby's genes or chromosomes are most often the result of errors that occur, by chance, as the embryo divides and grows. The problems are not inherited from the parents.  Infection of the cervix or uterus.   Hormone problems.   Problems with the cervix, such as having an incompetent cervix. This is when the tissue in the cervix is not strong enough to hold the pregnancy.   Problems with the uterus, such as an abnormally shaped uterus, uterine fibroids, or congenital abnormalities.   Certain medical conditions.   Smoking, drinking alcohol, or taking illegal drugs.   Trauma.  Often, the cause of a miscarriage is unknown.  SYMPTOMS   Vaginal bleeding or spotting, with or without cramps or pain.  Pain or cramping in the abdomen or lower back.  Passing fluid, tissue, or blood clots from the vagina. DIAGNOSIS  Your caregiver will perform a physical exam. You may also have an ultrasound to confirm the miscarriage. Blood or urine tests may also be ordered. TREATMENT   Sometimes, treatment is not necessary if you naturally pass all the fetal tissue that was in the uterus. If some of the fetus or placenta remains in the body (incomplete miscarriage), tissue left behind may become infected and must be removed. Usually, a dilation and curettage (D and C) procedure is performed.  During a D and C procedure, the cervix is widened (dilated) and any remaining fetal or placental tissue is gently removed from the uterus.  Antibiotic medicines are prescribed if there is an infection. Other medicines may be given to reduce the size of the uterus (contract) if there is a lot of bleeding.  If you have Rh negative blood and your baby was Rh positive, you will need a Rh immunoglobulin shot. This shot will protect any future baby from having Rh blood problems in future pregnancies. HOME CARE INSTRUCTIONS   Your caregiver may order bed rest or may allow you to continue light activity. Resume activity as directed by your caregiver.  Have someone help with home and family responsibilities during this time.   Keep track of the number of sanitary pads you use each day and how soaked (saturated) they are. Write down this information.   Do not use tampons. Do not douche or have sexual intercourse until approved by your caregiver.   Only take over-the-counter or prescription medicines for pain or discomfort as directed by your caregiver.   Do not take aspirin. Aspirin can cause bleeding.   Keep all follow-up appointments with your caregiver.   If you or your partner have problems with grieving, talk to your caregiver or seek counseling to help cope with the pregnancy loss. Allow enough time to grieve before trying to get pregnant again.  SEEK IMMEDIATE MEDICAL CARE IF:   You have severe cramps or pain in your back or abdomen.  You have a fever.  You pass large blood clots (walnut-sized   or larger) ortissue from your vagina. Save any tissue for your caregiver to inspect.   Your bleeding increases.   You have a thick, bad-smelling vaginal discharge.  You become lightheaded, weak, or you faint.   You have chills.  MAKE SURE YOU:  Understand these instructions.  Will watch your condition.  Will get help right away if you are not doing well or get  worse. Document Released: 01/04/2001 Document Revised: 11/05/2012 Document Reviewed: 08/30/2011 Exit[employer] Patient Information 2015 [employer]. This information is not intended to replace advice given to you by your health care provider. Make sure you discuss any questions you have with your health care provider.  

## 2014-01-10 NOTE — MAU Provider Note (Signed)
Called the patient to discuss HCG results as the patient had requested. Reviewed HCG is still decreasing. She is still very adamant that she have a D&C ASAP. States that bleeding is the same at this time, and has not increased since leaving here. Reviewed bleeding precautions, and appointment time for clinic visit on Monday at 3:00.

## 2014-01-13 ENCOUNTER — Ambulatory Visit: Payer: Self-pay | Admitting: Obstetrics & Gynecology

## 2014-01-13 ENCOUNTER — Telehealth: Payer: Self-pay

## 2014-01-13 NOTE — Telephone Encounter (Signed)
Consulted Dr. Burnice LoganHarraway- Katrinka BlazingSmith who states patient may have already passed fetus. Called patient who explains that she is bleeding, however, not has heavily or clotting as much as she had at the beginning. Dr. Erin FullingHarraway-Smith requests patient have ultrasound to determine if POC have passed. Patient states she does not want to have an ultrasound and is adamant that she have a D/C-- informed patient that ultrasound would be necessary to determine need for D/C. Patient states she will not have ultrasound and will only see a provider to discuss D/C. Scheduled patient to be seen 01/15/14 at 1415 with Dr. Debroah LoopArnold. Informed patient of appointment. Patient also had ultrasound scheduled for 01/15/14. Informed patient I would change to transvaginal ultrasound so that the provider will have results when she is in clinic and that she should continue to plan to go to this ultrasound. Patient stated she would not go to the ultrasound and that was that. Informed patient she can do as she wishes, however, the provider in the end may want to do an ultrasound. Patient verbalized understanding. Advised patient to call clinic if she is unable to keep appointment.

## 2014-01-13 NOTE — Telephone Encounter (Signed)
Patient no-showed today's appointment.

## 2014-01-15 ENCOUNTER — Telehealth: Payer: Self-pay | Admitting: *Deleted

## 2014-01-15 ENCOUNTER — Ambulatory Visit: Payer: Self-pay | Admitting: Obstetrics & Gynecology

## 2014-01-15 ENCOUNTER — Encounter: Payer: Self-pay | Admitting: *Deleted

## 2014-01-15 ENCOUNTER — Ambulatory Visit (HOSPITAL_COMMUNITY): Payer: Self-pay

## 2014-01-15 NOTE — Telephone Encounter (Signed)
Alondria missed a scheduled appointment for f/u sab. Called Shaterria and left a message she missed a scheduled appointment - please call and reschedule. Also sent a letter

## 2014-01-17 ENCOUNTER — Ambulatory Visit (HOSPITAL_COMMUNITY): Admission: RE | Admit: 2014-01-17 | Payer: Self-pay | Source: Ambulatory Visit

## 2014-05-26 ENCOUNTER — Encounter (HOSPITAL_COMMUNITY): Payer: Self-pay | Admitting: *Deleted

## 2014-11-01 ENCOUNTER — Encounter (HOSPITAL_COMMUNITY): Payer: Self-pay | Admitting: *Deleted

## 2018-04-09 ENCOUNTER — Encounter: Payer: Self-pay | Admitting: Podiatry

## 2018-04-09 ENCOUNTER — Ambulatory Visit: Payer: BC Managed Care – PPO | Admitting: Podiatry

## 2018-04-09 DIAGNOSIS — L603 Nail dystrophy: Secondary | ICD-10-CM | POA: Diagnosis not present

## 2018-04-09 DIAGNOSIS — S99921A Unspecified injury of right foot, initial encounter: Secondary | ICD-10-CM | POA: Diagnosis not present

## 2018-04-09 NOTE — Patient Instructions (Signed)

## 2018-04-09 NOTE — Progress Notes (Signed)
   Subjective:    Patient ID: Shelley Ramirez, female    DOB: [birth year], 37 y.o.   MRN: [id number]  HPI 37 year old female presents the office today for possible toenail fungus.  She states that she did hit her right big toenail about 2 months ago and the nail did come off.  She currently denies any pain and denies any drainage or pus.  She said no recent treatment for the toenails.  She has no other concerns today.   Review of Systems  All other systems reviewed and are negative.  Past Medical History:  Diagnosis Date  . Acid reflux     Past Surgical History:  Procedure Laterality Date  . CESAREAN SECTION     14years ago    No current outpatient medications on file.  Allergies  Allergen Reactions  . Codeine Shortness Of Breath and Other (See Comments)    Hyperventilation, pass out, loss off consciousness           Objective:   Physical Exam  General: AAO x3, NAD  Dermatological: No toenails present the right hallux toenail and scab is present on the nailbed there is no open sores there is no drainage or pus.  The left hallux toenail is the right second toenail appears to be hypertrophic, dystrophic ill-defined discoloration.  There is no pain in the nails there is no surrounding redness or drainage or any signs of infection noted today.  Vascular: Dorsalis Pedis artery and Posterior Tibial artery pedal pulses are 2/4 bilateral with immedate capillary fill time. There is no pain with calf compression, swelling, warmth, erythema.   Neruologic: Grossly intact via light touch bilateral.  Protective threshold with Semmes Wienstein monofilament intact to all pedal sites bilateral.   Musculoskeletal: No gross boney pedal deformities bilateral. No pain, crepitus, or limitation noted with foot and ankle range of motion bilateral. Muscular strength 5/5 in all groups tested bilateral.  Gait: Unassisted, Nonantalgic.     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  However the other toenails on the right second as well as left hallux would be hypertrophic and dystrophic.  Due to this I did debride the nail to any complications of this for culture, [facility].  Discussed treatment options for nail fungus however will await the results of the culture before proceeding with definitive treatment.  Vivi BarrackMatthew R Wagoner DPM

## 2020-01-11 ENCOUNTER — Other Ambulatory Visit: Payer: Self-pay

## 2020-01-11 ENCOUNTER — Encounter (HOSPITAL_COMMUNITY): Payer: Self-pay

## 2020-01-11 ENCOUNTER — Emergency Department (HOSPITAL_COMMUNITY)
Admission: EM | Admit: 2020-01-11 | Discharge: 2020-01-12 | Disposition: A | Discharge status: Home / Self Care | Payer: 59 | Attending: Emergency Medicine | Admitting: Emergency Medicine

## 2020-01-11 DIAGNOSIS — T781XXA Other adverse food reactions, not elsewhere classified, initial encounter: Secondary | ICD-10-CM | POA: Insufficient documentation

## 2020-01-11 DIAGNOSIS — T7840XA Allergy, unspecified, initial encounter: Secondary | ICD-10-CM | POA: Diagnosis present

## 2020-01-11 MED ORDER — FAMOTIDINE IN NACL 20-0.9 MG/50ML-% IV SOLN
20.0000 mg | Freq: Once | INTRAVENOUS | Status: AC
  Administered 2020-01-11: 20 mg via INTRAVENOUS
  Filled 2020-01-11: qty 50

## 2020-01-11 MED ORDER — DIPHENHYDRAMINE HCL 50 MG/ML IJ SOLN
50.0000 mg | Freq: Once | INTRAMUSCULAR | Status: AC
  Administered 2020-01-11: 50 mg via INTRAVENOUS
  Filled 2020-01-11: qty 1

## 2020-01-11 MED ORDER — EPINEPHRINE 0.3 MG/0.3ML IJ SOAJ: 0.3000 mg | INTRAMUSCULAR | 0 refills | Status: DC | PRN

## 2020-01-11 MED ORDER — METHYLPREDNISOLONE SODIUM SUCC 125 MG IJ SOLR
125.0000 mg | Freq: Once | INTRAMUSCULAR | Status: AC
  Administered 2020-01-11: 125 mg via INTRAVENOUS
  Filled 2020-01-11: qty 2

## 2020-01-11 MED ORDER — EPINEPHRINE 0.3 MG/0.3ML IJ SOAJ
0.3000 mg | INTRAMUSCULAR | 0 refills | Status: DC | PRN
Start: 2020-01-11 — End: 2022-03-15

## 2020-01-11 NOTE — ED Triage Notes (Signed)
Per Pt, Pt had seafood, following that pt began having generalized itching, and swelling. Pt also states that her tongue is swelling, pt able to talk in complete sentences, denies difficulty breathing at this time.

## 2020-01-11 NOTE — Discharge Instructions (Addendum)
It was our pleasure to provide your ER care today - we hope that you feel better.  Take benadryl 25 mg every 6 hours for the next day, then as need.   Avoid shrimp. Follow up with allergist in the next 1-2 weeks - call office to arrange appointment.   In event of future severe allergic reaction (I.e. throat closing, unable to breath, about to faint), use epipen as directed, take benadryl, and seek medical attention right away.   Return to ER if symptoms worsen, throat closing/swelling, trouble breathing or swallowing, fainting, or other concern.

## 2020-01-11 NOTE — ED Provider Notes (Signed)
[facility]-EMERGENCY DEPT Provider Note   CSN: [id number] Arrival date & time: 01/11/20  2123     History Chief Complaint  Patient presents with  . Allergic Reaction    Shelley Ramirez is a 39 y.o. female.  Patient c/o 'allergic reaction'. States had eaten shrimp for dinner, and a relatively short time after leaving restaurant developed itching, initially of bil hands, but spreading all over body, diffuse hives, and had feeling that tongue was swelling. States similar symptoms one time in remote past 'due to nasty seafood' then. Denies other new foods. Denies any recent medication use of any sort. No change in any home or personal products. Symptoms acute onset, moderate-severe, persistent, constant. No wheezing. No faintness.   The history is provided by the patient.  Allergic Reaction Presenting symptoms: no rash        Past Medical History:  Diagnosis Date  . Acid reflux     There are no problems to display for this patient.   Past Surgical History:  Procedure Laterality Date  . CESAREAN SECTION     14years ago     OB History    Gravida  4   Para  1   Term  1   Preterm      AB  2   Living  1     SAB  2   TAB      Ectopic      Multiple      Live Births              Family History  Problem Relation Age of Onset  . Kidney disease Mother   . Cancer Maternal Aunt   . Diabetes Maternal Aunt     Social History   Tobacco Use  . Smoking status: Never Smoker  . Smokeless tobacco: Never Used  Substance Use Topics  . Alcohol use: No  . Drug use: No    Home Medications Prior to Admission medications   Not on File    Allergies    Codeine  Review of Systems   Review of Systems  Constitutional: Negative for chills and fever.  HENT: Negative for sore throat.   Eyes: Negative for redness.  Respiratory: Negative for cough and shortness of breath.   Cardiovascular: Negative for chest pain.  Gastrointestinal:  Negative for abdominal pain, diarrhea and vomiting.  Genitourinary: Negative for flank pain.  Musculoskeletal: Negative for back pain and neck pain.  Skin: Negative for rash.  Allergic/Immunologic: Positive for food allergies.  Neurological: Negative for headaches.  Hematological: Does not bruise/bleed easily.  Psychiatric/Behavioral: Negative for confusion.    Physical Exam Updated Vital Signs BP 107/79   Pulse 100   Temp 97.6 F (36.4 C)   Resp 16   SpO2 100%   Physical Exam Vitals and nursing note reviewed.  Constitutional:      Appearance: Normal appearance. She is well-developed.  HENT:     Head: Atraumatic.     Nose: Nose normal.     Mouth/Throat:     Mouth: Mucous membranes are moist.     Pharynx: Oropharynx is clear. No posterior oropharyngeal erythema.     Comments: No gross tongue or throat swelling noted.  Eyes:     General: No scleral icterus.    Conjunctiva/sclera: Conjunctivae normal.     Pupils: Pupils are equal, round, and reactive to light.  Neck:     Trachea: No tracheal deviation.  Cardiovascular:     Rate  and Rhythm: Normal rate and regular rhythm.     Pulses: Normal pulses.     Heart sounds: Normal heart sounds. No murmur heard.  No friction rub. No gallop.   Pulmonary:     Effort: Pulmonary effort is normal. No respiratory distress.     Breath sounds: Normal breath sounds.  Abdominal:     General: Bowel sounds are normal. There is no distension.     Palpations: Abdomen is soft.     Tenderness: There is no abdominal tenderness. There is no guarding.  Genitourinary:    Comments: No cva tenderness.  Musculoskeletal:        General: No swelling or tenderness.     Cervical back: Normal range of motion and neck supple. No rigidity. No muscular tenderness.  Skin:    General: Skin is warm and dry.     Comments: +hives.   Neurological:     Mental Status: She is alert.     Comments: Alert, speech normal.   Psychiatric:        Mood and Affect:  Mood normal.     ED Results / Procedures / Treatments   Labs (all labs ordered are listed, but only abnormal results are displayed) Labs Reviewed - No data to display  EKG None  Radiology No results found.  Procedures Procedures (including critical care time)  Medications Ordered in ED Medications  diphenhydrAMINE (BENADRYL) injection 50 mg (has no administration in time range)  methylPREDNISolone sodium succinate (SOLU-MEDROL) 125 mg/2 mL injection 125 mg (has no administration in time range)  famotidine (PEPCID) IVPB 20 mg premix (has no administration in time range)    ED Course  I have reviewed the triage vital signs and the nursing notes.  Pertinent labs & imaging results that were available during my care of the patient were reviewed by me and considered in my medical decision making (see chart for details).    MDM Rules/Calculators/A&P                          No meds pta. Benadryl 50 mg iv, pepcid 20 mg iv, solumedrol iv.   Continuous pulse ox and monitor.   Reviewed nursing notes and prior charts for additional history.   Recheck pt - indicates feels fine. No itching. Rash resolved. No throat or tongue swelling on exam. Pharynx normal. No wheezing or stridor. Vitals normal, hr 84, rr 14, pulse ox 100%, bp normal.   Pt currently appears stable for d/c.  Will send in rx epipen to use in event of a future, severe allergic rx, and rec allergy f/u.   Return precautions provided.     Final Clinical Impression(s) / ED Diagnoses Final diagnoses:  None    Rx / DC Orders ED Discharge Orders    None       Lajean Saver, MD 01/11/20 2304

## 2020-01-12 NOTE — ED Notes (Signed)
Pt is not displaying any signs of respiratory distress at discharge, allergic reaction appears to be subsiding.

## 2020-11-16 ENCOUNTER — Other Ambulatory Visit: Payer: Self-pay | Admitting: Obstetrics and Gynecology

## 2020-11-16 DIAGNOSIS — Z1231 Encounter for screening mammogram for malignant neoplasm of breast: Secondary | ICD-10-CM

## 2021-02-01 ENCOUNTER — Ambulatory Visit
Admission: RE | Admit: 2021-02-01 | Discharge: 2021-02-01 | Disposition: A | Discharge status: Home / Self Care | Payer: 59 | Source: Ambulatory Visit | Attending: Obstetrics and Gynecology | Admitting: Obstetrics and Gynecology

## 2021-02-01 ENCOUNTER — Other Ambulatory Visit: Payer: Self-pay

## 2021-02-01 DIAGNOSIS — Z1231 Encounter for screening mammogram for malignant neoplasm of breast: Secondary | ICD-10-CM

## 2021-03-25 ENCOUNTER — Ambulatory Visit (INDEPENDENT_AMBULATORY_CARE_PROVIDER_SITE_OTHER): Payer: 59 | Admitting: Bariatrics

## 2021-03-25 ENCOUNTER — Encounter (INDEPENDENT_AMBULATORY_CARE_PROVIDER_SITE_OTHER): Payer: Self-pay | Admitting: Bariatrics

## 2021-03-25 ENCOUNTER — Other Ambulatory Visit: Payer: Self-pay

## 2021-03-25 VITALS — BP 113/72 | HR 82 | Temp 98.0°F | Ht 61.0 in | Wt 205.0 lb

## 2021-03-25 DIAGNOSIS — E669 Obesity, unspecified: Secondary | ICD-10-CM

## 2021-03-25 DIAGNOSIS — E559 Vitamin D deficiency, unspecified: Secondary | ICD-10-CM

## 2021-03-25 DIAGNOSIS — D508 Other iron deficiency anemias: Secondary | ICD-10-CM | POA: Diagnosis not present

## 2021-03-25 DIAGNOSIS — Z9189 Other specified personal risk factors, not elsewhere classified: Secondary | ICD-10-CM

## 2021-03-25 DIAGNOSIS — Z1331 Encounter for screening for depression: Secondary | ICD-10-CM | POA: Diagnosis not present

## 2021-03-25 DIAGNOSIS — R0602 Shortness of breath: Secondary | ICD-10-CM | POA: Diagnosis not present

## 2021-03-25 DIAGNOSIS — E785 Hyperlipidemia, unspecified: Secondary | ICD-10-CM

## 2021-03-25 DIAGNOSIS — Z0289 Encounter for other administrative examinations: Secondary | ICD-10-CM

## 2021-03-25 DIAGNOSIS — R7309 Other abnormal glucose: Secondary | ICD-10-CM

## 2021-03-25 DIAGNOSIS — R5383 Other fatigue: Secondary | ICD-10-CM | POA: Diagnosis not present

## 2021-03-25 DIAGNOSIS — Z6838 Body mass index (BMI) 38.0-38.9, adult: Secondary | ICD-10-CM

## 2021-03-25 DIAGNOSIS — E6609 Other obesity due to excess calories: Secondary | ICD-10-CM

## 2021-03-25 NOTE — Progress Notes (Signed)
Dear Shelley Fanny, NP,   Thank you for referring Shelley Ramirez to our clinic. The following note includes my evaluation and treatment recommendations.  Chief Complaint:   OBESITY Shelley Ramirez (MR# [id number]) is a 40 y.o. female who presents for evaluation and treatment of obesity and related comorbidities. Current BMI is Body mass index is 38.73 kg/m. Shelley Ramirez has been struggling with her weight for many years and has been unsuccessful in either losing weight, maintaining weight loss, or reaching her healthy weight goal.  Shelley Ramirez is currently in the action stage of change and ready to dedicate time achieving and maintaining a healthier weight. Shelley Ramirez is interested in becoming our patient and working on intensive lifestyle modifications including (but not limited to) diet and exercise for weight loss.  Shelley Ramirez states that she is lactose intolerant.  She states that her spouse is in the program.  She does not eat red meat or pork.  Shelley Ramirez's habits were reviewed today and are as follows: Her family eats meals together, she thinks her family will eat healthier with her, her desired weight loss is 53 pounds, she started gaining weight during COVID, her heaviest weight ever was 225 pounds, she is a picky eater and doesn't like to eat healthier foods, she craves sweets and soda, she skips breakfast and lunch frequently, she is frequently drinking liquids with calories, she frequently makes poor food choices, and she struggles with emotional eating.  Depression Screen Shelley Ramirez (modified PHQ-9) score was 11.  Depression screen PHQ 2/9 03/25/2021  Decreased Interest 2  Down, Depressed, Hopeless 1  PHQ - 2 Score 3  Altered sleeping 3  Tired, decreased energy 3  Change in appetite 2  Feeling bad or failure about yourself  0  Trouble concentrating 0  Moving slowly or fidgety/restless 0  Suicidal thoughts 0  PHQ-9 Score 11  Difficult doing work/chores Somewhat difficult    Subjective:   1. Other fatigue Shelley Ramirez denies daytime somnolence and reports waking up still tired. Patent has a history of symptoms of morning fatigue and morning headache. Shelley Ramirez generally gets 4 hours of sleep per night, and states that she has poor sleep quality. Snoring is not present. Apneic episodes are not present. Epworth Sleepiness Score is 1.  Occurs with certain activities.  2. SOB (shortness of breath) on exertion Shelley Ramirez notes increasing shortness of breath with exercising and seems to be worsening over time with weight gain. She notes getting out of breath sooner with activity than she used to. This has gotten worse recently. Shelley Ramirez denies shortness of breath at rest or orthopnea.  Occurs with certain activities.  3. Vitamin D deficiency She is currently taking no vitamin D supplement.   4. Other iron deficiency anemia Shelley Ramirez takes an iron supplement.  Hgb 11.9, Hct 36.4.  CBC Latest Ref Rng & Units 01/10/2014 01/09/2014 12/27/2013  WBC 4.0 - 10.5 K/uL 7.0 6.4 5.6  Hemoglobin 12.0 - 15.0 g/dL 2.7(O) 5.3(G) 6.4(Q)  Hematocrit 36.0 - 46.0 % 30.8(L) 29.7(L) 30.0(L)  Platelets 150 - 400 K/uL 396 351 417(H)   5. Elevated glucose Shelley Ramirez has history of elevated glucose.  6. Elevated lipids Shelley Ramirez has hyperlipidemia and has been trying to improve her cholesterol levels with intensive lifestyle modification including a low saturated fat diet, exercise and weight loss. She denies any chest pain, claudication or myalgias.  She is on no medications for this.  Lab Results  Component Value Date   ALT 11 08/27/2013  AST 16 08/27/2013   ALKPHOS 45 08/27/2013   BILITOT 0.3 08/27/2013   7. Depression screening Shelley Ramirez was screened for depression as part of her new patient workup today.  PHQ-9 is 11.  8. At risk for activity intolerance Shelley Ramirez is at risk for activity intolerance due to obesity and the weather.  Assessment/Plan:   1. Other fatigue Shelley Ramirez does feel that her weight is causing  her energy to be lower than it should be. Fatigue may be related to obesity, depression or many other causes. Labs will be ordered, and in the meanwhile, Shelley Ramirez will focus on self care including making healthy food choices, increasing physical activity and focusing on stress reduction.  Gradually increase activities.  2. SOB (shortness of breath) on exertion Shelley Ramirez does feel that she gets out of breath more easily that she used to when she exercises. Shelley Ramirez's shortness of breath appears to be obesity related and exercise induced. She has agreed to work on weight loss and gradually increase exercise to treat her exercise induced shortness of breath. Will continue to monitor closely.  Gradually increase activities.  - EKG 12-Lead  3. Vitamin D deficiency Will check vitamin D level today.  - VITAMIN D 25 Hydroxy (Vit-D Deficiency, Fractures)  4. Other iron deficiency anemia Check iron/anemia panel today.  Counseling Iron is essential for our bodies to make red blood cells.  Reasons that someone may be deficient include: an iron-deficient diet (more likely in those following vegan or vegetarian diets), women with heavy menses, patients with GI disorders or poor absorption, patients that have had bariatric surgery, frequent blood donors, patients with cancer, and patients with heart disease.   Iron-rich foods include dark leafy greens, red and white meats, eggs, seafood, and beans.   Certain foods and drinks prevent your body from absorbing iron properly. Avoid eating these foods in the same meal as iron-rich foods or with iron supplements. These foods include: coffee, black tea, and red wine; milk, dairy products, and foods that are high in calcium; beans and soybeans; whole grains.  Constipation can be a side effect of iron supplementation. Increased water and fiber intake are helpful. Water goal: > 2 liters/day. Fiber goal: > 25 grams/day.  - Anemia panel  5. Elevated glucose Will check A1c and  insulin level today.  - Hemoglobin A1c - Insulin, random  6. Elevated lipids Cardiovascular risk and specific lipid/LDL goals reviewed.  We discussed several lifestyle modifications today and Shelley Ramirez will continue to work on diet, exercise and weight loss efforts. Orders and follow up as documented in patient record.  Will check lipid panel today.  Counseling Intensive lifestyle modifications are the first line treatment for this issue. Dietary changes: Increase soluble fiber. Decrease simple carbohydrates. Exercise changes: Moderate to vigorous-intensity aerobic activity 150 minutes per week if tolerated. Lipid-lowering medications: see documented in medical record.  - Lipid Panel With LDL/HDL Ratio - Comprehensive metabolic panel  7. Depression screening Shelley Ramirez had a positive depression screening. Depression is commonly associated with obesity and often results in emotional eating behaviors. We will monitor this closely and work on CBT to help improve the non-hunger eating patterns. Referral to Psychology may be required if no improvement is seen as she continues in our clinic.  8. At risk for activity intolerance Shelley Ramirez was given approximately 15 minutes of exercise intolerance counseling today. She is 40 y.o. female and has risk factors exercise intolerance including obesity. We discussed intensive lifestyle modifications today with an emphasis on specific weight loss  instructions and strategies. Shelley Ramirez will slowly increase activity as tolerated.  Repetitive spaced learning was employed today to elicit superior memory formation and behavioral change.  9. Obesity with current BMI of 38.9  Shelley Ramirez is currently in the action stage of change and her goal is to continue with weight loss efforts. I recommend Shelley Ramirez begin the structured treatment plan as follows:  She has agreed to the Category 1 Plan +100 calories.  She will work on meal planning, intentional eating, and eliminating sugary  drinks.  T4, TSH, FSH, Hgb/Hct.  Exercise goals: No exercise has been prescribed at this time.   Behavioral modification strategies: increasing lean protein intake, decreasing simple carbohydrates, increasing vegetables, increasing water intake, decreasing eating out, no skipping meals, meal planning and cooking strategies, keeping healthy foods in the home, and planning for success.  She was informed of the importance of frequent follow-up visits to maximize her success with intensive lifestyle modifications for her multiple health conditions. She was informed we would discuss her lab results at her next visit unless there is a critical issue that needs to be addressed sooner. Jaleigha agreed to keep her next visit at the agreed upon time to discuss these results.  Objective:   Blood pressure 113/72, pulse 82, temperature 98 F (36.7 C), height 5\' 1"  (1.549 m), weight 205 lb (93 kg), SpO2 100 %, unknown if currently breastfeeding. Body mass index is 38.73 kg/m.  EKG: Normal sinus rhythm, rate 87 bpm.  Indirect Calorimeter completed today shows a VO2 of 205 and a REE of 1411.  Her calculated basal metabolic rate is thus her basal metabolic rate is worse than expected.  General: Cooperative, alert, well developed, in no acute distress. HEENT: Conjunctivae and lids unremarkable. Cardiovascular: Regular rhythm.  Lungs: Normal work of breathing. Neurologic: No focal deficits.   Lab Results  Component Value Date   CREATININE 0.85 08/27/2013   BUN 11 08/27/2013   NA 139 08/27/2013   K 4.3 08/27/2013   CL 105 08/27/2013   CO2 24 08/27/2013   Lab Results  Component Value Date   ALT 11 08/27/2013   AST 16 08/27/2013   ALKPHOS 45 08/27/2013   BILITOT 0.3 08/27/2013   Lab Results  Component Value Date   WBC 7.0 01/10/2014   HGB 9.9 (L) 01/10/2014   HCT 30.8 (L) 01/10/2014   MCV 78.8 01/10/2014   PLT 396 01/10/2014   Attestation Statements:   Reviewed by clinician on day of  visit: allergies, medications, problem list, medical history, surgical history, family history, social history, and previous encounter notes.  I, 01/12/2014, CMA, am acting as Insurance claims handler for Energy manager, DO  I have reviewed the above documentation for accuracy and completeness, and I agree with the above. [employer], DO

## 2021-03-26 LAB — ANEMIA PANEL
Ferritin: 9 ng/mL — ABNORMAL LOW (ref 15–150)
Folate, Hemolysate: 229 ng/mL
Folate, RBC: 678 ng/mL (ref >498)
Hematocrit: 33.8 % — ABNORMAL LOW (ref 34.0–46.6)
Iron Saturation: 5 % — CL (ref 15–55)
Iron: 21 ug/dL — ABNORMAL LOW (ref 27–159)
Retic Ct Pct: 1.8 % (ref 0.6–2.6)
Total Iron Binding Capacity: 438 ug/dL (ref 250–450)
UIBC: 417 ug/dL (ref 131–425)
Vitamin B-12: 876 pg/mL (ref 232–1245)

## 2021-03-26 LAB — LIPID PANEL WITH LDL/HDL RATIO
Cholesterol, Total: 216 mg/dL — ABNORMAL HIGH (ref 100–199)
HDL: 67 mg/dL (ref >39)
LDL Chol Calc (NIH): 130 mg/dL — ABNORMAL HIGH (ref 0–99)
LDL/HDL Ratio: 1.9 ratio (ref 0.0–3.2)
Triglycerides: 106 mg/dL (ref 0–149)
VLDL Cholesterol Cal: 19 mg/dL (ref 5–40)

## 2021-03-26 LAB — COMPREHENSIVE METABOLIC PANEL
ALT: 23 IU/L (ref 0–32)
AST: 27 IU/L (ref 0–40)
Albumin/Globulin Ratio: 1.5 (ref 1.2–2.2)
Albumin: 4.3 g/dL (ref 3.8–4.8)
Alkaline Phosphatase: 90 IU/L (ref 44–121)
BUN/Creatinine Ratio: 9 (ref 9–23)
BUN: 8 mg/dL (ref 6–24)
Bilirubin Total: 0.3 mg/dL (ref 0.0–1.2)
CO2: 23 mmol/L (ref 20–29)
Calcium: 9.5 mg/dL (ref 8.7–10.2)
Chloride: 101 mmol/L (ref 96–106)
Creatinine, Ser: 0.89 mg/dL (ref 0.57–1.00)
Globulin, Total: 2.9 g/dL (ref 1.5–4.5)
Glucose: 70 mg/dL (ref 65–99)
Potassium: 4.4 mmol/L (ref 3.5–5.2)
Sodium: 139 mmol/L (ref 134–144)
Total Protein: 7.2 g/dL (ref 6.0–8.5)
eGFR: 83 mL/min/{1.73_m2} (ref >59)

## 2021-03-26 LAB — HEMOGLOBIN A1C
Est. average glucose Bld gHb Est-mCnc: 111 mg/dL
Hgb A1c MFr Bld: 5.5 % (ref 4.8–5.6)

## 2021-03-26 LAB — VITAMIN D 25 HYDROXY (VIT D DEFICIENCY, FRACTURES): Vit D, 25-Hydroxy: 10.5 ng/mL — ABNORMAL LOW (ref 30.0–100.0)

## 2021-03-26 LAB — INSULIN, RANDOM: INSULIN: 8.5 u[IU]/mL (ref 2.6–24.9)

## 2021-03-28 ENCOUNTER — Encounter (INDEPENDENT_AMBULATORY_CARE_PROVIDER_SITE_OTHER): Payer: Self-pay | Admitting: Bariatrics

## 2021-03-30 ENCOUNTER — Encounter (INDEPENDENT_AMBULATORY_CARE_PROVIDER_SITE_OTHER): Payer: Self-pay | Admitting: Bariatrics

## 2021-03-30 DIAGNOSIS — E6609 Other obesity due to excess calories: Secondary | ICD-10-CM | POA: Insufficient documentation

## 2021-03-30 DIAGNOSIS — E559 Vitamin D deficiency, unspecified: Secondary | ICD-10-CM | POA: Insufficient documentation

## 2021-03-30 DIAGNOSIS — Z6838 Body mass index (BMI) 38.0-38.9, adult: Secondary | ICD-10-CM | POA: Insufficient documentation

## 2021-03-30 DIAGNOSIS — D509 Iron deficiency anemia, unspecified: Secondary | ICD-10-CM | POA: Insufficient documentation

## 2021-03-30 DIAGNOSIS — E7800 Pure hypercholesterolemia, unspecified: Secondary | ICD-10-CM | POA: Insufficient documentation

## 2021-03-30 NOTE — Telephone Encounter (Signed)
Last OV with Dr Brown 

## 2021-04-08 ENCOUNTER — Encounter (INDEPENDENT_AMBULATORY_CARE_PROVIDER_SITE_OTHER): Payer: Self-pay | Admitting: Bariatrics

## 2021-04-08 ENCOUNTER — Ambulatory Visit (INDEPENDENT_AMBULATORY_CARE_PROVIDER_SITE_OTHER): Payer: 59 | Admitting: Bariatrics

## 2021-04-08 ENCOUNTER — Other Ambulatory Visit: Payer: Self-pay

## 2021-04-08 ENCOUNTER — Other Ambulatory Visit (HOSPITAL_BASED_OUTPATIENT_CLINIC_OR_DEPARTMENT_OTHER): Payer: Self-pay

## 2021-04-08 VITALS — BP 122/83 | HR 74 | Temp 98.3°F | Ht 61.0 in | Wt 209.0 lb

## 2021-04-08 DIAGNOSIS — E6609 Other obesity due to excess calories: Secondary | ICD-10-CM

## 2021-04-08 DIAGNOSIS — F5089 Other specified eating disorder: Secondary | ICD-10-CM

## 2021-04-08 DIAGNOSIS — D508 Other iron deficiency anemias: Secondary | ICD-10-CM | POA: Diagnosis not present

## 2021-04-08 DIAGNOSIS — Z9189 Other specified personal risk factors, not elsewhere classified: Secondary | ICD-10-CM | POA: Diagnosis not present

## 2021-04-08 DIAGNOSIS — E559 Vitamin D deficiency, unspecified: Secondary | ICD-10-CM | POA: Diagnosis not present

## 2021-04-08 DIAGNOSIS — Z6838 Body mass index (BMI) 38.0-38.9, adult: Secondary | ICD-10-CM

## 2021-04-08 MED ORDER — BUPROPION HCL ER (SR) 150 MG PO TB12
150.0000 mg | ORAL_TABLET | Freq: Every day | ORAL | 0 refills | Status: DC
  Filled 2021-04-08: qty 30, 30d supply, fill #0

## 2021-04-12 NOTE — Progress Notes (Signed)
Chief Complaint:   OBESITY Shelley Ramirez is here to discuss her progress with her obesity treatment plan along with follow-up of her obesity related diagnoses. Shelley Ramirez is on the Category 1 Plan and states she is following her eating plan approximately 75% of the time. Shelley Ramirez states she is doing 0 minutes 0 times per week.  Today's visit was #: 3 Starting weight: 205 lbs Starting date: 03/25/2021 Today's weight: 209 lbs Today's date: 04/08/2021 Total lbs lost to date: 209 lbs Total lbs lost since last in-office visit: 0  Interim History: Shelley Ramirez is up 4 lbs since her first visit. She is on her cycle and eating more carbohydrates (common with cycle).   Subjective:   1. Other iron deficiency anemia Shelley Ramirez is currently not on iron supplements. She has normal cycles. Her Hct level was 33.8. Her iron decreased to 21. Ferritin was 9 and Iron saturated was 5.  2. Vitamin D deficiency Shelley Ramirez is currently not on Vitamin D. Her Vitamin D level was 10.5.  3. Other disorder of eating Shelley Ramirez is eating more with cycle.  4. At risk for osteoporosis Shelley Ramirez is at risk for osteoporosis due to Vitamin D deficiency.   Assessment/Plan:   1. Other iron deficiency anemia A referral for hematology has been placed for Shelley Ramirez.  2. Vitamin D deficiency Low Vitamin D level contributes to fatigue and are associated with obesity, breast, and colon cancer. Shelley Ramirez agrees to start prescription Vitamin D 50,000 IU every week at home and she will follow-up for routine testing of Vitamin D, at least 2-3 times per year to avoid over-replacement.  3. Other disorder of eating Behavior modification techniques were discussed today to help Shelley Ramirez deal with her emotional/non-hunger eating behaviors.  We will refill bupropion 150 mg daily for 1 month with no refills. Orders and follow up as documented in patient record.  [facility].  - buPROPion (WELLBUTRIN SR) 150 MG 12 hr tablet; Take 1 tablet (150 mg total) by  mouth daily.  Dispense: 30 tablet; Refill: 0  4. At risk for osteoporosis Shelley Ramirez was given approximately 15 minutes of osteoporosis prevention counseling today. Shelley Ramirez is at risk for osteopenia and osteoporosis due to her Vitamin D deficiency. She was encouraged to take her Vitamin D and follow her higher calcium diet and increase strengthening exercise to help strengthen her bones and decrease her risk of osteopenia and osteoporosis.  Repetitive spaced learning was employed today to elicit superior memory formation and behavioral change.  5. Obesity, current BMI 39.6 Shelley Ramirez is currently in the action stage of change. As such, her goal is to continue with weight loss efforts. She has agreed to the Category 1 Plan.   Shelley Ramirez will continue meal planning and intentional eating. We will review labs from 03/25/2021.  Exercise goals: No exercise has been prescribed at this time.  Behavioral modification strategies: increasing lean protein intake, decreasing simple carbohydrates, increasing vegetables, increasing water intake, decreasing eating out, no skipping meals, meal planning and cooking strategies, keeping healthy foods in the home, and planning for success.  Shelley Ramirez has agreed to follow-up with our clinic in 2 weeks. She was informed of the importance of frequent follow-up visits to maximize her success with intensive lifestyle modifications for her multiple health conditions.   Objective:   Blood pressure 122/83, pulse 74, temperature 98.3 F (36.8 C), height 5\' 1"  (1.549 m), weight 209 lb (94.8 kg), SpO2 100 %, unknown if currently breastfeeding. Body mass index is 39.49  kg/m.  General: Cooperative, alert, well developed, in no acute distress. HEENT: Conjunctivae and lids unremarkable. Cardiovascular: Regular rhythm.  Lungs: Normal work of breathing. Neurologic: No focal deficits.   Lab Results  Component Value Date   CREATININE 0.89 03/25/2021   BUN 8 03/25/2021   NA 139 03/25/2021   K  4.4 03/25/2021   CL 101 03/25/2021   CO2 23 03/25/2021   Lab Results  Component Value Date   ALT 23 03/25/2021   AST 27 03/25/2021   ALKPHOS 90 03/25/2021   BILITOT 0.3 03/25/2021   Lab Results  Component Value Date   HGBA1C 5.5 03/25/2021   Lab Results  Component Value Date   INSULIN 8.5 03/25/2021   No results found for: TSH Lab Results  Component Value Date   CHOL 216 (H) 03/25/2021   HDL 67 03/25/2021   LDLCALC 130 (H) 03/25/2021   TRIG 106 03/25/2021   Lab Results  Component Value Date   VD25OH 10.5 (L) 03/25/2021   Lab Results  Component Value Date   WBC 7.0 01/10/2014   HGB 9.9 (L) 01/10/2014   HCT 33.8 (L) 03/25/2021   MCV 78.8 01/10/2014   PLT 396 01/10/2014   Lab Results  Component Value Date   IRON 21 (L) 03/25/2021   TIBC 438 03/25/2021   FERRITIN 9 (L) 03/25/2021   Attestation Statements:   Reviewed by clinician on day of visit: allergies, medications, problem list, medical history, surgical history, family history, social history, and previous encounter notes.  I, Jackson Latino, RMA, am acting as Energy manager for [employer], DO.   I have reviewed the above documentation for accuracy and completeness, and I agree with the above. Corinna Capra, DO

## 2021-04-15 ENCOUNTER — Encounter (INDEPENDENT_AMBULATORY_CARE_PROVIDER_SITE_OTHER): Payer: Self-pay | Admitting: Bariatrics

## 2021-04-26 ENCOUNTER — Ambulatory Visit (INDEPENDENT_AMBULATORY_CARE_PROVIDER_SITE_OTHER): Payer: 59 | Admitting: Bariatrics

## 2021-04-26 ENCOUNTER — Other Ambulatory Visit: Payer: Self-pay

## 2021-04-26 ENCOUNTER — Encounter (INDEPENDENT_AMBULATORY_CARE_PROVIDER_SITE_OTHER): Payer: Self-pay | Admitting: Bariatrics

## 2021-04-26 ENCOUNTER — Other Ambulatory Visit (HOSPITAL_BASED_OUTPATIENT_CLINIC_OR_DEPARTMENT_OTHER): Payer: Self-pay

## 2021-04-26 VITALS — BP 126/83 | HR 94 | Temp 97.9°F | Ht 61.0 in | Wt 207.0 lb

## 2021-04-26 DIAGNOSIS — Z6838 Body mass index (BMI) 38.0-38.9, adult: Secondary | ICD-10-CM

## 2021-04-26 DIAGNOSIS — D508 Other iron deficiency anemias: Secondary | ICD-10-CM

## 2021-04-26 DIAGNOSIS — F5089 Other specified eating disorder: Secondary | ICD-10-CM | POA: Diagnosis not present

## 2021-04-26 DIAGNOSIS — E559 Vitamin D deficiency, unspecified: Secondary | ICD-10-CM

## 2021-04-26 DIAGNOSIS — G43809 Other migraine, not intractable, without status migrainosus: Secondary | ICD-10-CM | POA: Diagnosis not present

## 2021-04-26 DIAGNOSIS — E6609 Other obesity due to excess calories: Secondary | ICD-10-CM

## 2021-04-26 DIAGNOSIS — Z9189 Other specified personal risk factors, not elsewhere classified: Secondary | ICD-10-CM

## 2021-04-26 MED ORDER — BUPROPION HCL ER (SR) 200 MG PO TB12
200.0000 mg | ORAL_TABLET | Freq: Every day | ORAL | 0 refills | Status: DC
Start: 2021-04-26 — End: 2021-05-25
  Filled 2021-04-26: qty 30, 30d supply, fill #0

## 2021-04-26 MED ORDER — BUPROPION HCL ER (SR) 150 MG PO TB12: 150.0000 mg | ORAL_TABLET | Freq: Every day | ORAL | 0 refills | Status: DC

## 2021-04-26 MED ORDER — SUMATRIPTAN SUCCINATE 50 MG PO TABS
ORAL_TABLET | ORAL | 0 refills | Status: DC
Start: 2021-04-26 — End: 2021-05-25
  Filled 2021-04-26: qty 9, 30d supply, fill #0

## 2021-04-27 ENCOUNTER — Encounter (INDEPENDENT_AMBULATORY_CARE_PROVIDER_SITE_OTHER): Payer: Self-pay | Admitting: Bariatrics

## 2021-04-27 NOTE — Progress Notes (Signed)
Chief Complaint:   OBESITY Shelley Ramirez is here to discuss her progress with her obesity treatment plan along with follow-up of her obesity related diagnoses. Shelley Ramirez is on the Category 1 Plan and states she is following her eating plan approximately 75% of the time. Shelley Ramirez states she is doing 0 minutes 0 times per week.  Today's visit was #: 4 Starting weight: 205 lbs Starting date: 03/25/2021 Today's weight: 207 lbs Today's date: 04/26/2021 Total lbs lost to date: 0 Total lbs lost since last in-office visit: 2  Interim History: Shelley Ramirez is down 3 lbs since her last visit. She states that her craving are better. She is doing well with her water and protein intake.   Subjective:   1. Other iron deficiency anemia Shelley Ramirez is taking supplements. Her iron is stable.  2. Vitamin D deficiency Shelley Ramirez is taking Vitamin D OTC currently.  3. Other migraine without status migrainosus, not intractable Shelley Ramirez notes migraines with her menstrual cycle.  4. Other disorder of eating Shelley Ramirez states Wellbutrin is helping with emotional eating. She notes no side effects.  5. At risk for osteoporosis Shelley Ramirez is at risk for osteoporosis due to Vitamin D deficiency.  Assessment/Plan:   1. Other iron deficiency anemia Orders and follow up as documented in patient record.Shelley Ramirez will follow up with her Primary Care Physician.  Counseling Iron is essential for our bodies to make red blood cells.  Reasons that someone may be deficient include: an iron-deficient diet (more likely in those following vegan or vegetarian diets), women with heavy menses, patients with GI disorders or poor absorption, patients that have had bariatric surgery, frequent blood donors, patients with cancer, and patients with heart disease.   An iron supplement has been recommended. This is found over-the-counter.  Iron-rich foods include dark leafy greens, red and white meats, eggs, seafood, and beans.   Certain foods and drinks prevent your body  from absorbing iron properly. Avoid eating these foods in the same meal as iron-rich foods or with iron supplements. These foods include: coffee, black tea, and red wine; milk, dairy products, and foods that are high in calcium; beans and soybeans; whole grains.  Constipation can be a side effect of iron supplementation. Increased water and fiber intake are helpful. Water goal: > 2 liters/day. Fiber goal: > 25 grams/day.   2. Vitamin D deficiency Low Vitamin D level contributes to fatigue and are associated with obesity, breast, and colon cancer. Shelley Ramirez agrees to continue to take OTC Vitamin D daily and she will follow-up for routine testing of Vitamin D, at least 2-3 times per year to avoid over-replacement.  3. Other migraine without status migrainosus, not intractable We will refill Imitrex 50 mg with no refills.   - SUMAtriptan (IMITREX) 50 MG tablet; Take 1 Tablet By Mouth 1-2 Times Daily  Dispense: 10 tablet; Refill: 0  4. Other disorder of eating Behavior modification techniques were discussed today to help Shelley Ramirez deal with her emotional/non-hunger eating behaviors.  We will refill Wellbutrin 200 mg for 1 month with no refills. Orders and follow up as documented in patient record.    - buPROPion (WELLBUTRIN SR) 200 MG 12 hr tablet; Take 1 tablet (200 mg total) by mouth daily.  Dispense: 30 tablet; Refill: 0  5. At risk for osteoporosis We will continue to monitor. Orders and follow up as documented in patient record.  Counseling Osteoporosis happens when your bones get thin and weak. This can cause your bones to break (fracture) more easily.  Exercise is very important to keep bones strong. Focus on strength training (lifting weights) and exercises that make your muscles work to hold your body weight up (weight-bearing exercises). These include tai chi, yoga, and walking.  Limit alcohol intake to no more than 1 drink a day for nonpregnant women and 2 drinks a day for men. One drink equals  12 oz of beer, 5 oz of wine, or 1 oz of hard liquor. Do not use any products that have nicotine or tobacco in them.  Preventing falls Use tools to help you move around (mobility aids) as needed. These include canes, walkers, scooters, and crutches. Keep rooms well-lit and free of clutter. Wear shoes that fit you well and support your feet. Eat plenty of calcium and Vitamin D as these nutrients are good for your bones.     6. Obesity, current BMI 39.3 Shelley Ramirez is currently in the action stage of change. As such, her goal is to continue with weight loss efforts. She has agreed to the Category 1 Plan.   Shelley Ramirez will continue meal planning and intentional eating.   Exercise goals: No exercise has been prescribed at this time.  Behavioral modification strategies: increasing lean protein intake, decreasing simple carbohydrates, increasing vegetables, increasing water intake, decreasing eating out, no skipping meals, meal planning and cooking strategies, keeping healthy foods in the home, and planning for success.  Shelley Ramirez has agreed to follow-up with our clinic in 2 weeks. She was informed of the importance of frequent follow-up visits to maximize her success with intensive lifestyle modifications for her multiple health conditions.   Objective:   Blood pressure 126/83, pulse 94, temperature 97.9 F (36.6 C), height $RemoveBe'5\' 1"'CuzpVWVHI$  (1.549 m), weight 207 lb (93.9 kg), SpO2 100 %, unknown if currently breastfeeding. Body mass index is 39.11 kg/m.  General: Cooperative, alert, well developed, in no acute distress. HEENT: Conjunctivae and lids unremarkable. Cardiovascular: Regular rhythm.  Lungs: Normal work of breathing. Neurologic: No focal deficits.   Lab Results  Component Value Date   CREATININE 0.89 03/25/2021   BUN 8 03/25/2021   NA 139 03/25/2021   K 4.4 03/25/2021   CL 101 03/25/2021   CO2 23 03/25/2021   Lab Results  Component Value Date   ALT 23 03/25/2021   AST 27 03/25/2021   ALKPHOS  90 03/25/2021   BILITOT 0.3 03/25/2021   Lab Results  Component Value Date   HGBA1C 5.5 03/25/2021   Lab Results  Component Value Date   INSULIN 8.5 03/25/2021   No results found for: TSH Lab Results  Component Value Date   CHOL 216 (H) 03/25/2021   HDL 67 03/25/2021   LDLCALC 130 (H) 03/25/2021   TRIG 106 03/25/2021   Lab Results  Component Value Date   VD25OH 10.5 (L) 03/25/2021   Lab Results  Component Value Date   WBC 7.0 01/10/2014   HGB 9.9 (L) 01/10/2014   HCT 33.8 (L) 03/25/2021   MCV 78.8 01/10/2014   PLT 396 01/10/2014   Lab Results  Component Value Date   IRON 21 (L) 03/25/2021   TIBC 438 03/25/2021   FERRITIN 9 (L) 03/25/2021   Attestation Statements:   Reviewed by clinician on day of visit: allergies, medications, problem list, medical history, surgical history, family history, social history, and previous encounter notes.   I, Lizbeth Bark, RMA, am acting as Location manager for [employer], DO.   I have reviewed the above documentation for accuracy and completeness, and I agree with the  above. Jearld Lesch, DO

## 2021-04-28 ENCOUNTER — Other Ambulatory Visit (HOSPITAL_BASED_OUTPATIENT_CLINIC_OR_DEPARTMENT_OTHER): Payer: Self-pay

## 2021-05-11 ENCOUNTER — Ambulatory Visit (INDEPENDENT_AMBULATORY_CARE_PROVIDER_SITE_OTHER): Payer: 59 | Admitting: Bariatrics

## 2021-05-11 ENCOUNTER — Encounter (INDEPENDENT_AMBULATORY_CARE_PROVIDER_SITE_OTHER): Payer: Self-pay | Admitting: Bariatrics

## 2021-05-11 ENCOUNTER — Other Ambulatory Visit: Payer: Self-pay

## 2021-05-11 VITALS — BP 122/78 | HR 95 | Temp 98.0°F | Ht 61.0 in | Wt 206.0 lb

## 2021-05-11 DIAGNOSIS — F5089 Other specified eating disorder: Secondary | ICD-10-CM

## 2021-05-11 DIAGNOSIS — Z6838 Body mass index (BMI) 38.0-38.9, adult: Secondary | ICD-10-CM | POA: Diagnosis not present

## 2021-05-11 DIAGNOSIS — E6609 Other obesity due to excess calories: Secondary | ICD-10-CM

## 2021-05-11 DIAGNOSIS — E559 Vitamin D deficiency, unspecified: Secondary | ICD-10-CM | POA: Diagnosis not present

## 2021-05-12 NOTE — Progress Notes (Signed)
Chief Complaint:   OBESITY Shelley Ramirez is here to discuss her progress with her obesity treatment plan along with follow-up of her obesity related diagnoses. Shelley Ramirez is on the Category 1 Plan and states she is following her eating plan approximately 95% of the time. Shelley Ramirez states she is doing 0 minutes 0 times per week.  Today's visit was #: 5 Starting weight: 205 lbs Starting date: 03/25/2021 Today's weight: 206 lbs Today's date: 05/11/2021 Total lbs lost to date: 0 Total lbs lost since last in-office visit: 0  Interim History: [name] is down 1 lb since her last visit. She states that her appetite is leveling  out. She is doing well with her water intake.   Subjective:   1. Vitamin D deficiency Shelley Ramirez is currently taking OTC Vitamin D.  2. Other disorder of eating Shelley Ramirez is taking Wellbutrin currently. She denies any problems.  Assessment/Plan:   1. Vitamin D deficiency Low Vitamin D level contributes to fatigue and are associated with obesity, breast, and colon cancer. Lincy agrees to continue to take OTC Vitamin D 50,000 IU daily and she will follow-up for routine testing of Vitamin D, at least 2-3 times per year to avoid over-replacement.  2. Other disorder of eating Behavior modification techniques were discussed today to help Shelley Ramirez deal with her emotional/non-hunger eating behaviors.  Shelley Ramirez will continue Wellbutrin. Orders and follow up as documented in patient record.    3. Obesity, current BMI 39.0 Shelley Ramirez is currently in the action stage of change. As such, her goal is to continue with weight loss efforts. She has agreed to the Category 1 Plan and keeping a food journal and adhering to recommended goals of 1000 calories and 70-80 grams of protein.   Shelley Ramirez will continue meal planning. She will begin exercise and will stay adherent to the plan.   Exercise goals:  [place]ssica will start taking the stairs and will add in walking.  Behavioral modification strategies: increasing lean  protein intake, decreasing simple carbohydrates, increasing vegetables, increasing water intake, decreasing eating out, no skipping meals, meal planning and cooking strategies, keeping healthy foods in the home, and planning for success.  Shelley Ramirez has agreed to follow-up with our clinic in 2 weeks. She was informed of the importance of frequent follow-up visits to maximize her success with intensive lifestyle modifications for her multiple health conditions.   Objective:   Blood pressure 122/78, pulse 95, temperature 98 F (36.7 C), height 5\' 1"  (1.549 m), weight 206 lb (93.4 kg), SpO2 100 %, unknown if currently breastfeeding. Body mass index is 38.92 kg/m.  General: Cooperative, alert, well developed, in no acute distress. HEENT: Conjunctivae and lids unremarkable. Cardiovascular: Regular rhythm.  Lungs: Normal work of breathing. Neurologic: No focal deficits.   Lab Results  Component Value Date   CREATININE 0.89 03/25/2021   BUN 8 03/25/2021   NA 139 03/25/2021   K 4.4 03/25/2021   CL 101 03/25/2021   CO2 23 03/25/2021   Lab Results  Component Value Date   ALT 23 03/25/2021   AST 27 03/25/2021   ALKPHOS 90 03/25/2021   BILITOT 0.3 03/25/2021   Lab Results  Component Value Date   HGBA1C 5.5 03/25/2021   Lab Results  Component Value Date   INSULIN 8.5 03/25/2021   No results found for: TSH Lab Results  Component Value Date   CHOL 216 (H) 03/25/2021   HDL 67 03/25/2021   LDLCALC 130 (H) 03/25/2021   TRIG 106 03/25/2021   Lab  Results  Component Value Date   VD25OH 10.5 (L) 03/25/2021   Lab Results  Component Value Date   WBC 7.0 01/10/2014   HGB 9.9 (L) 01/10/2014   HCT 33.8 (L) 03/25/2021   MCV 78.8 01/10/2014   PLT 396 01/10/2014   Lab Results  Component Value Date   IRON 21 (L) 03/25/2021   TIBC 438 03/25/2021   FERRITIN 9 (L) 03/25/2021   Attestation Statements:   Reviewed by clinician on day of visit: allergies, medications, problem list, medical  history, surgical history, family history, social history, and previous encounter notes.  I, Shelley Ramirez, RMA, am acting as Energy manager for [employer], DO.   I have reviewed the above documentation for accuracy and completeness, and I agree with the above. Shelley Capra, DO

## 2021-05-13 ENCOUNTER — Encounter (INDEPENDENT_AMBULATORY_CARE_PROVIDER_SITE_OTHER): Payer: Self-pay | Admitting: Bariatrics

## 2021-05-25 ENCOUNTER — Encounter (INDEPENDENT_AMBULATORY_CARE_PROVIDER_SITE_OTHER): Payer: Self-pay | Admitting: Bariatrics

## 2021-05-25 ENCOUNTER — Ambulatory Visit (INDEPENDENT_AMBULATORY_CARE_PROVIDER_SITE_OTHER): Payer: 59 | Admitting: Bariatrics

## 2021-05-25 ENCOUNTER — Other Ambulatory Visit: Payer: Self-pay

## 2021-05-25 ENCOUNTER — Other Ambulatory Visit (HOSPITAL_BASED_OUTPATIENT_CLINIC_OR_DEPARTMENT_OTHER): Payer: Self-pay

## 2021-05-25 VITALS — BP 127/80 | HR 97 | Temp 98.0°F | Ht 61.0 in | Wt 207.0 lb

## 2021-05-25 DIAGNOSIS — Z6838 Body mass index (BMI) 38.0-38.9, adult: Secondary | ICD-10-CM

## 2021-05-25 DIAGNOSIS — F5089 Other specified eating disorder: Secondary | ICD-10-CM | POA: Diagnosis not present

## 2021-05-25 DIAGNOSIS — G43809 Other migraine, not intractable, without status migrainosus: Secondary | ICD-10-CM

## 2021-05-25 DIAGNOSIS — R632 Polyphagia: Secondary | ICD-10-CM | POA: Diagnosis not present

## 2021-05-25 MED ORDER — SUMATRIPTAN SUCCINATE 50 MG PO TABS: ORAL_TABLET | ORAL | 0 refills | Status: DC

## 2021-05-25 MED ORDER — TIRZEPATIDE 2.5 MG/0.5ML ~~LOC~~ SOAJ
2.5000 mg | SUBCUTANEOUS | 0 refills | Status: DC
  Filled 2021-05-25: qty 2, 28d supply, fill #0

## 2021-05-25 MED ORDER — BUPROPION HCL ER (SR) 200 MG PO TB12: 200.0000 mg | ORAL_TABLET | Freq: Every day | ORAL | 0 refills | Status: DC

## 2021-05-26 ENCOUNTER — Encounter (INDEPENDENT_AMBULATORY_CARE_PROVIDER_SITE_OTHER): Payer: Self-pay | Admitting: Bariatrics

## 2021-05-26 NOTE — Progress Notes (Signed)
Chief Complaint:   OBESITY Shelley Ramirez is here to discuss her progress with her obesity treatment plan along with follow-up of her obesity related diagnoses. Shelley Ramirez is on the Category 1 Plan and states she is following her eating plan approximately 85% of the time. Shelley Ramirez states she is walking for 15 minutes 3 times per week.  Today's visit was #: 6 Starting weight: 205 lbs Starting date: 03/25/2021 Today's weight: 207 lbs Today's date: 05/25/2021 Total lbs lost to date: 0 Total lbs lost since last in-office visit: 0  Interim History: Shelley Ramirez is up 1 lb since her last visit as she has been out of town. She is not struggling with  any specific issues.   Subjective:   1. Other migraine without status migrainosus, not intractable Shelley Ramirez's migraines are controlled with medication.  2. Polyphagia Shelley Ramirez denies pancreatitis. She denies any absolute or relative contraindications to a GLP-1 She will use a barrier method at all time and she will consult her OB/Gyn for an ID which is her preferred type of birth control. She was made aware that this medication could cause birth defects.   3. Other disorder of eating Shelley Ramirez is taking her medications as directed.   Assessment/Plan:   1. Other migraine without status migrainosus, not intractable Shelley Ramirez notes migraines. We will refill Imitrex 50 mg with no refills.   - SUMAtriptan (IMITREX) 50 MG tablet; Take 1 Tablet By Mouth 1-2 Times Daily  Dispense: 10 tablet; Refill: 0  2. Polyphagia Intensive lifestyle modifications are the first line treatment for this issue. We discussed several lifestyle modifications today and she will continue to work on diet, exercise and weight loss efforts. Shelley Ramirez will consult with her OB/GYN for birth control. She will always use a barrier method. We will refill Mounjaro 2.5 mg with no refills. A coupon was given. Orders and follow up as documented in patient record.  Counseling Polyphagia is excessive hunger. Causes can  include: low blood sugars, hypERthyroidism, PMS, lack of sleep, stress, insulin resistance, diabetes, certain medications, and diets that are deficient in protein and fiber.   - tirzepatide (MOUNJARO) 2.5 MG/0.5ML Pen; Inject 2.5 mg into the skin once a week.  Dispense: 2 mL; Refill: 0  3. Other disorder of eating Behavior modification techniques were discussed today to help Shelley Ramirez deal with her emotional/non-hunger eating behaviors.  We will refill Wellbutrin SR 200 mg for 1 month with no refills. Orders and follow up as documented in patient record.    - buPROPion (WELLBUTRIN SR) 200 MG 12 hr tablet; Take 1 tablet (200 mg total) by mouth daily.  Dispense: 30 tablet; Refill: 0  4. Class 2 severe obesity with serious comorbidity and body mass index (BMI) of 38.0 to 38.9 in adult, unspecified obesity type (HCC) Shelley Ramirez is currently in the action stage of change. As such, her goal is to continue with weight loss efforts. She has agreed to the Category 1 Plan.   Shelley Ramirez will continue meal planning. She will adhere closely to the plan at 80-90%.  Exercise goals:  As is.   Behavioral modification strategies: increasing lean protein intake, decreasing simple carbohydrates, increasing vegetables, increasing water intake, decreasing eating out, no skipping meals, meal planning and cooking strategies, keeping healthy foods in the home, and planning for success.  Shelley Ramirez has agreed to follow-up with our clinic in 2-3 weeks. She was informed of the importance of frequent follow-up visits to maximize her success with intensive lifestyle modifications for her multiple health conditions.  Objective:   Blood pressure 127/80, pulse 97, temperature 98 F (36.7 C), height 5\' 1"  (1.549 m), weight 207 lb (93.9 kg), SpO2 99 %, unknown if currently breastfeeding. Body mass index is 39.11 kg/m.  General: Cooperative, alert, well developed, in no acute distress. HEENT: Conjunctivae and lids  unremarkable. Cardiovascular: Regular rhythm.  Lungs: Normal work of breathing. Neurologic: No focal deficits.   Lab Results  Component Value Date   CREATININE 0.89 03/25/2021   BUN 8 03/25/2021   NA 139 03/25/2021   K 4.4 03/25/2021   CL 101 03/25/2021   CO2 23 03/25/2021   Lab Results  Component Value Date   ALT 23 03/25/2021   AST 27 03/25/2021   ALKPHOS 90 03/25/2021   BILITOT 0.3 03/25/2021   Lab Results  Component Value Date   HGBA1C 5.5 03/25/2021   Lab Results  Component Value Date   INSULIN 8.5 03/25/2021   No results found for: TSH Lab Results  Component Value Date   CHOL 216 (H) 03/25/2021   HDL 67 03/25/2021   LDLCALC 130 (H) 03/25/2021   TRIG 106 03/25/2021   Lab Results  Component Value Date   VD25OH 10.5 (L) 03/25/2021   Lab Results  Component Value Date   WBC 7.0 01/10/2014   HGB 9.9 (L) 01/10/2014   HCT 33.8 (L) 03/25/2021   MCV 78.8 01/10/2014   PLT 396 01/10/2014   Lab Results  Component Value Date   IRON 21 (L) 03/25/2021   TIBC 438 03/25/2021   FERRITIN 9 (L) 03/25/2021   Attestation Statements:   Reviewed by clinician on day of visit: allergies, medications, problem list, medical history, surgical history, family history, social history, and previous encounter notes.  I, 05/25/2021, RMA, am acting as Jackson Latino for Energy manager, DO.   I have reviewed the above documentation for accuracy and completeness, and I agree with the above. [employer], DO

## 2021-05-28 ENCOUNTER — Other Ambulatory Visit (HOSPITAL_BASED_OUTPATIENT_CLINIC_OR_DEPARTMENT_OTHER): Payer: Self-pay

## 2021-06-09 ENCOUNTER — Other Ambulatory Visit (HOSPITAL_BASED_OUTPATIENT_CLINIC_OR_DEPARTMENT_OTHER): Payer: Self-pay

## 2021-06-09 ENCOUNTER — Ambulatory Visit (INDEPENDENT_AMBULATORY_CARE_PROVIDER_SITE_OTHER): Payer: 59 | Admitting: Bariatrics

## 2021-06-09 ENCOUNTER — Other Ambulatory Visit: Payer: Self-pay

## 2021-06-09 VITALS — BP 116/78 | HR 78 | Temp 98.3°F | Ht 61.0 in | Wt 207.0 lb

## 2021-06-09 DIAGNOSIS — E6609 Other obesity due to excess calories: Secondary | ICD-10-CM | POA: Diagnosis not present

## 2021-06-09 DIAGNOSIS — Z6838 Body mass index (BMI) 38.0-38.9, adult: Secondary | ICD-10-CM | POA: Diagnosis not present

## 2021-06-09 DIAGNOSIS — F5089 Other specified eating disorder: Secondary | ICD-10-CM

## 2021-06-09 DIAGNOSIS — R632 Polyphagia: Secondary | ICD-10-CM

## 2021-06-09 MED ORDER — ONDANSETRON 4 MG PO TBDP
4.0000 mg | ORAL_TABLET | Freq: Three times a day (TID) | ORAL | 0 refills | Status: DC | PRN
  Filled 2021-06-09: qty 20, 7d supply, fill #0

## 2021-06-09 MED ORDER — BUPROPION HCL ER (SR) 200 MG PO TB12
200.0000 mg | ORAL_TABLET | Freq: Every day | ORAL | 0 refills | Status: DC
  Filled 2021-06-09: qty 30, 30d supply, fill #0

## 2021-06-09 MED ORDER — TIRZEPATIDE 5 MG/0.5ML ~~LOC~~ SOAJ
5.0000 mg | SUBCUTANEOUS | 0 refills | Status: DC
  Filled 2021-06-09: qty 2, 28d supply, fill #0

## 2021-06-10 ENCOUNTER — Encounter (INDEPENDENT_AMBULATORY_CARE_PROVIDER_SITE_OTHER): Payer: Self-pay | Admitting: Bariatrics

## 2021-06-10 ENCOUNTER — Other Ambulatory Visit (HOSPITAL_BASED_OUTPATIENT_CLINIC_OR_DEPARTMENT_OTHER): Payer: Self-pay

## 2021-06-10 NOTE — Progress Notes (Signed)
Chief Complaint:   OBESITY Shelley Ramirez is here to discuss her progress with her obesity treatment plan along with follow-up of her obesity related diagnoses. Shelley Ramirez is on the Category 1 Plan and states she is following her eating plan approximately 85% of the time. Shelley Ramirez states she is doing 0 minutes 0 times per week.  Today's visit was #: 7 Starting weight: 205 lbs Starting date: 03/25/2021 Today's weight: 207 lbs Today's date:06/09/2021 Total lbs lost to date: 0 Total lbs lost since last in-office visit: 0  Interim History: Shelley Ramirez's weight remains the same.  Subjective:   1. Polyphagia Shelley Ramirez will be using a barrier method. She denies side effects except occasionally nausea.   2. Other disorder of eating Shelley Ramirez notes decreased cravings.  Assessment/Plan:   1. Polyphagia Intensive lifestyle modifications are the first line treatment for this issue. We discussed several lifestyle modifications today and she will continue to work on diet, exercise and weight loss efforts. We will refill Mounjaro 5 mg for 1 month with no refills. We will refill Zofran 4 mg once every 8 hours with no refills. Orders and follow up as documented in patient record.  Counseling Polyphagia is excessive hunger. Causes can include: low blood sugars, hypERthyroidism, PMS, lack of sleep, stress, insulin resistance, diabetes, certain medications, and diets that are deficient in protein and fiber.    - tirzepatide (MOUNJARO) 5 MG/0.5ML Pen; Inject 5 mg into the skin once a week.  Dispense: 2 mL; Refill: 0 - ondansetron (ZOFRAN ODT) 4 MG disintegrating tablet; Take 1 tablet (4 mg total) by mouth every 8 (eight) hours as needed for nausea or vomiting.  Dispense: 20 tablet; Refill: 0  2. Other disorder of eating Behavior modification techniques were discussed today to help Shelley Ramirez deal with her emotional/non-hunger eating behaviors.  We will refill Wellbutrin 200 mg for 1 month with no refills. Orders and follow up as  documented in patient record.    - buPROPion (WELLBUTRIN SR) 200 MG 12 hr tablet; Take 1 tablet (200 mg total) by mouth daily.  Dispense: 30 tablet; Refill: 0  3. Obesity, current BMI 39.1 Shelley Ramirez is currently in the action stage of change. As such, her goal is to continue with weight loss efforts. She has agreed to the Category 1 Plan.   Shelley Ramirez will continue meal planning and she will adhere closely to the plan.  Exercise goals: No exercise has been prescribed at this time.  Behavioral modification strategies: increasing lean protein intake, decreasing simple carbohydrates, increasing vegetables, increasing water intake, decreasing eating out, no skipping meals, meal planning and cooking strategies, keeping healthy foods in the home, and planning for success.  Shelley Ramirez has agreed to follow-up with our clinic in 2 weeks. She was informed of the importance of frequent follow-up visits to maximize her success with intensive lifestyle modifications for her multiple health conditions.   Objective:   Blood pressure 116/78, pulse 78, temperature 98.3 F (36.8 C), height 5\' 1"  (1.549 m), weight 207 lb (93.9 kg), SpO2 94 %, unknown if currently breastfeeding. Body mass index is 39.11 kg/m.  General: Cooperative, alert, well developed, in no acute distress. HEENT: Conjunctivae and lids unremarkable. Cardiovascular: Regular rhythm.  Lungs: Normal work of breathing. Neurologic: No focal deficits.   Lab Results  Component Value Date   CREATININE 0.89 03/25/2021   BUN 8 03/25/2021   NA 139 03/25/2021   K 4.4 03/25/2021   CL 101 03/25/2021   CO2 23 03/25/2021   Lab Results  Component Value Date   ALT 23 03/25/2021   AST 27 03/25/2021   ALKPHOS 90 03/25/2021   BILITOT 0.3 03/25/2021   Lab Results  Component Value Date   HGBA1C 5.5 03/25/2021   Lab Results  Component Value Date   INSULIN 8.5 03/25/2021   No results found for: TSH Lab Results  Component Value Date   CHOL 216 (H)  03/25/2021   HDL 67 03/25/2021   LDLCALC 130 (H) 03/25/2021   TRIG 106 03/25/2021   Lab Results  Component Value Date   VD25OH 10.5 (L) 03/25/2021   Lab Results  Component Value Date   WBC 7.0 01/10/2014   HGB 9.9 (L) 01/10/2014   HCT 33.8 (L) 03/25/2021   MCV 78.8 01/10/2014   PLT 396 01/10/2014   Lab Results  Component Value Date   IRON 21 (L) 03/25/2021   TIBC 438 03/25/2021   FERRITIN 9 (L) 03/25/2021   Attestation Statements:   Reviewed by clinician on day of visit: allergies, medications, problem list, medical history, surgical history, family history, social history, and previous encounter notes.  I, Jackson Latino, RMA, am acting as Energy manager for [employer], DO.   I have reviewed the above documentation for accuracy and completeness, and I agree with the above. Corinna Capra, DO

## 2021-06-23 ENCOUNTER — Other Ambulatory Visit (INDEPENDENT_AMBULATORY_CARE_PROVIDER_SITE_OTHER): Payer: Self-pay | Admitting: Bariatrics

## 2021-06-23 DIAGNOSIS — F5089 Other specified eating disorder: Secondary | ICD-10-CM

## 2021-06-30 ENCOUNTER — Ambulatory Visit (INDEPENDENT_AMBULATORY_CARE_PROVIDER_SITE_OTHER): Payer: 59 | Admitting: Family Medicine

## 2021-07-13 ENCOUNTER — Other Ambulatory Visit (HOSPITAL_BASED_OUTPATIENT_CLINIC_OR_DEPARTMENT_OTHER): Payer: Self-pay

## 2021-07-13 ENCOUNTER — Other Ambulatory Visit: Payer: Self-pay

## 2021-07-13 ENCOUNTER — Ambulatory Visit (INDEPENDENT_AMBULATORY_CARE_PROVIDER_SITE_OTHER): Payer: 59 | Admitting: Bariatrics

## 2021-07-13 VITALS — BP 110/76 | HR 96 | Temp 98.0°F | Ht 61.0 in | Wt 200.0 lb

## 2021-07-13 DIAGNOSIS — F5089 Other specified eating disorder: Secondary | ICD-10-CM | POA: Diagnosis not present

## 2021-07-13 DIAGNOSIS — R632 Polyphagia: Secondary | ICD-10-CM

## 2021-07-13 DIAGNOSIS — G43809 Other migraine, not intractable, without status migrainosus: Secondary | ICD-10-CM

## 2021-07-13 DIAGNOSIS — Z6838 Body mass index (BMI) 38.0-38.9, adult: Secondary | ICD-10-CM

## 2021-07-13 MED ORDER — TIRZEPATIDE 5 MG/0.5ML ~~LOC~~ SOAJ
5.0000 mg | SUBCUTANEOUS | 0 refills | Status: DC
  Filled 2021-07-13: qty 2, 28d supply, fill #0

## 2021-07-13 MED ORDER — SUMATRIPTAN SUCCINATE 50 MG PO TABS
ORAL_TABLET | ORAL | 1 refills | Status: DC
  Filled 2021-07-13: qty 10, 34d supply, fill #0

## 2021-07-14 ENCOUNTER — Encounter (INDEPENDENT_AMBULATORY_CARE_PROVIDER_SITE_OTHER): Payer: Self-pay | Admitting: Bariatrics

## 2021-07-14 NOTE — Progress Notes (Signed)
Chief Complaint:   OBESITY Shelley Ramirez is here to discuss her progress with her obesity treatment plan along with follow-up of her obesity related diagnoses. Shelley Ramirez is on the Category 1 Plan and states she is following her eating plan approximately 85-90% of the time. Shelley Ramirez states she is doing 0 minutes 0 times per week.  Today's visit was #: 8 Starting weight: 205 lbs Starting date: 03/25/2021 Today's weight: 200 lbs Today's date: 07/13/2021 Total lbs lost to date: 5 lbs Total lbs lost since last in-office visit: 7 lbs  Interim History: [place] is down 7 lbs since her last visit. She is following the plan and getting adequate water.  Subjective:   1. Other migraine without status migrainosus, not intractable Shelley Ramirez is taking her medication as directed. Her migraines increase with her cycle.  2. Polyphagia Shelley Ramirez is using appropriate measures to avoid any excessive carbohydrates.   3. Other disorder of eating Shelley Ramirez is currently not on medications.  Assessment/Plan:   1. Other migraine without status migrainosus, not intractable We will refill Imitrex 50 mg with no refills.   - SUMAtriptan (IMITREX) 50 MG tablet; Take 1 Tablet By Mouth 1-2 Times Daily  Dispense: 10 tablet; Refill: 1  2. Polyphagia Intensive lifestyle modifications are the first line treatment for this issue. We discussed several lifestyle modifications today and she will continue to work on diet, exercise and weight loss efforts. We will refill Mounjaro 5 mg with no refills. Orders and follow up as documented in patient record.  Counseling Polyphagia is excessive hunger. Causes can include: low blood sugars, hypERthyroidism, PMS, lack of sleep, stress, insulin resistance, diabetes, certain medications, and diets that are deficient in protein and fiber.   - tirzepatide (MOUNJARO) 5 MG/0.5ML Pen; Inject 5 mg into the skin once a week.  Dispense: 2 mL; Refill: 0  3. Other disorder of eating Behavior modification  techniques were discussed today to help Shelley Ramirez deal with her emotional/non-hunger eating behaviors.  Shelley Ramirez will work on providing hersel with distraction measures. Orders and follow up as documented in patient record.    4. Class 2 severe obesity with serious comorbidity and body mass index (BMI) of 38.0 to 38.9 in adult, unspecified obesity type (HCC) Shelley Ramirez is currently in the action stage of change. As such, her goal is to continue with weight loss efforts. She has agreed to the Category 1 Plan.   Shelley Ramirez will continue meal planning and she will continue intentional eating.  Exercise goals: Shelley Ramirez will start exercising the beginning of the year.  Behavioral modification strategies: increasing lean protein intake, decreasing simple carbohydrates, increasing vegetables, increasing water intake, decreasing eating out, no skipping meals, meal planning and cooking strategies, keeping healthy foods in the home, and planning for success.  Shelley Ramirez has agreed to follow-up with our clinic in 3 weeks. She was informed of the importance of frequent follow-up visits to maximize her success with intensive lifestyle modifications for her multiple health conditions.   Objective:   Blood pressure 110/76, pulse 96, temperature 98 F (36.7 C), height 5\' 1"  (1.549 m), weight 200 lb (90.7 kg), last menstrual period 06/24/2021, SpO2 100 %, unknown if currently breastfeeding. Body mass index is 37.79 kg/m.  General: Cooperative, alert, well developed, in no acute distress. HEENT: Conjunctivae and lids unremarkable. Cardiovascular: Regular rhythm.  Lungs: Normal work of breathing. Neurologic: No focal deficits.   Lab Results  Component Value Date   CREATININE 0.89 03/25/2021   BUN 8 03/25/2021   NA 139  03/25/2021   K 4.4 03/25/2021   CL 101 03/25/2021   CO2 23 03/25/2021   Lab Results  Component Value Date   ALT 23 03/25/2021   AST 27 03/25/2021   ALKPHOS 90 03/25/2021   BILITOT 0.3 03/25/2021   Lab  Results  Component Value Date   HGBA1C 5.5 03/25/2021   Lab Results  Component Value Date   INSULIN 8.5 03/25/2021   No results found for: TSH Lab Results  Component Value Date   CHOL 216 (H) 03/25/2021   HDL 67 03/25/2021   LDLCALC 130 (H) 03/25/2021   TRIG 106 03/25/2021   Lab Results  Component Value Date   VD25OH 10.5 (L) 03/25/2021   Lab Results  Component Value Date   WBC 7.0 01/10/2014   HGB 9.9 (L) 01/10/2014   HCT 33.8 (L) 03/25/2021   MCV 78.8 01/10/2014   PLT 396 01/10/2014   Lab Results  Component Value Date   IRON 21 (L) 03/25/2021   TIBC 438 03/25/2021   FERRITIN 9 (L) 03/25/2021   Attestation Statements:   Reviewed by clinician on day of visit: allergies, medications, problem list, medical history, surgical history, family history, social history, and previous encounter notes.  I, Jackson Latino, RMA, am acting as Energy manager for [employer], DO.  I have reviewed the above documentation for accuracy and completeness, and I agree with the above. Corinna Capra, DO

## 2021-08-03 DIAGNOSIS — Z6837 Body mass index (BMI) 37.0-37.9, adult: Secondary | ICD-10-CM | POA: Diagnosis not present

## 2021-08-03 DIAGNOSIS — G43829 Menstrual migraine, not intractable, without status migrainosus: Secondary | ICD-10-CM | POA: Diagnosis not present

## 2021-08-04 ENCOUNTER — Other Ambulatory Visit (HOSPITAL_BASED_OUTPATIENT_CLINIC_OR_DEPARTMENT_OTHER): Payer: Self-pay

## 2021-08-04 MED ORDER — SUMATRIPTAN SUCCINATE 50 MG PO TABS
ORAL_TABLET | ORAL | 1 refills | Status: DC
  Filled 2021-08-04: qty 10, 30d supply, fill #0

## 2021-08-05 ENCOUNTER — Encounter (INDEPENDENT_AMBULATORY_CARE_PROVIDER_SITE_OTHER): Payer: Self-pay | Admitting: Bariatrics

## 2021-08-05 ENCOUNTER — Ambulatory Visit (INDEPENDENT_AMBULATORY_CARE_PROVIDER_SITE_OTHER): Payer: 59 | Admitting: Bariatrics

## 2021-08-05 ENCOUNTER — Other Ambulatory Visit (HOSPITAL_BASED_OUTPATIENT_CLINIC_OR_DEPARTMENT_OTHER): Payer: Self-pay

## 2021-08-05 ENCOUNTER — Other Ambulatory Visit: Payer: Self-pay

## 2021-08-05 VITALS — BP 114/83 | HR 70 | Temp 97.9°F | Ht 61.0 in | Wt 190.0 lb

## 2021-08-05 DIAGNOSIS — E6609 Other obesity due to excess calories: Secondary | ICD-10-CM

## 2021-08-05 DIAGNOSIS — Z6835 Body mass index (BMI) 35.0-35.9, adult: Secondary | ICD-10-CM

## 2021-08-05 DIAGNOSIS — Z6838 Body mass index (BMI) 38.0-38.9, adult: Secondary | ICD-10-CM

## 2021-08-05 DIAGNOSIS — R632 Polyphagia: Secondary | ICD-10-CM | POA: Diagnosis not present

## 2021-08-05 DIAGNOSIS — E559 Vitamin D deficiency, unspecified: Secondary | ICD-10-CM

## 2021-08-05 MED ORDER — TIRZEPATIDE 7.5 MG/0.5ML ~~LOC~~ SOAJ
7.5000 mg | SUBCUTANEOUS | 0 refills | Status: DC
  Filled 2021-08-05: qty 2, 28d supply, fill #0

## 2021-08-09 ENCOUNTER — Encounter (INDEPENDENT_AMBULATORY_CARE_PROVIDER_SITE_OTHER): Payer: Self-pay | Admitting: Bariatrics

## 2021-08-09 NOTE — Progress Notes (Signed)
Chief Complaint:   OBESITY Shelley Ramirez is here to discuss her progress with her obesity treatment plan along with follow-up of her obesity related diagnoses. Shelley Ramirez is on the Category 1 Plan and states she is following her eating plan approximately 90% of the time. Shelley Ramirez states she is doing 0 minutes 0 times per week.  Today's visit was #: 9 Starting weight: 205 lbs Starting date: 03/25/2021 Today's weight: 190 lbs Today's date: 08/05/2021 Total lbs lost to date: 15 lbs Total lbs lost since last in-office visit: 10 lbs  Interim History: Shelley Ramirez is down an additional 10 lbs since her last visit. She ate more frequent meals.  Subjective:   1. Polyphagia Shelley Ramirez is currently talking Mounjaro. She notes occasionally nausea.  2. Vitamin D deficiency Shelley Ramirez is taking OTC Vitamin D currently.  Assessment/Plan:   1. Polyphagia We will refill Mounjaro 7.5 mg with no refills. Intensive lifestyle modifications are the first line treatment for this issue. We discussed several lifestyle modifications today and she will continue to work on diet, exercise and weight loss efforts. Orders and follow up as documented in patient record.  Counseling Polyphagia is excessive hunger. Causes can include: low blood sugars, hypERthyroidism, PMS, lack of sleep, stress, insulin resistance, diabetes, certain medications, and diets that are deficient in protein and fiber.    - tirzepatide (MOUNJARO) 7.5 MG/0.5ML Pen; Inject 7.5 mg into the skin once a week.  Dispense: 2 mL; Refill: 0  2. Vitamin D deficiency Low Vitamin D level contributes to fatigue and are associated with obesity, breast, and colon cancer. Shelley Ramirez will continue taking OTC Vitamin D and she will follow-up for routine testing of Vitamin D, at least 2-3 times per year to avoid over-replacement.  3. Obesity, current BMI 35.9 Shelley Ramirez is currently in the action stage of change. As such, her goal is to continue with weight loss efforts. She has agreed to  the Category 1 Plan.   Shelley Ramirez will continue meal planning and she will adhere closely to the plan.  Exercise goals:  Shelley Ramirez will start Zumba next week 3 times per week.   Behavioral modification strategies: increasing lean protein intake, decreasing simple carbohydrates, increasing vegetables, increasing water intake, decreasing eating out, no skipping meals, meal planning and cooking strategies, keeping healthy foods in the home, and planning for success.  Shelley Ramirez has agreed to follow-up with our clinic in 3 weeks. She was informed of the importance of frequent follow-up visits to maximize her success with intensive lifestyle modifications for her multiple health conditions.   Objective:   Blood pressure 114/83, pulse 70, temperature 97.9 F (36.6 C), height 5\' 1"  (1.549 m), weight 190 lb (86.2 kg), SpO2 100 %, unknown if currently breastfeeding. Body mass index is 35.9 kg/m.  General: Cooperative, alert, well developed, in no acute distress. HEENT: Conjunctivae and lids unremarkable. Cardiovascular: Regular rhythm.  Lungs: Normal work of breathing. Neurologic: No focal deficits.   Lab Results  Component Value Date   CREATININE 0.89 03/25/2021   BUN 8 03/25/2021   NA 139 03/25/2021   K 4.4 03/25/2021   CL 101 03/25/2021   CO2 23 03/25/2021   Lab Results  Component Value Date   ALT 23 03/25/2021   AST 27 03/25/2021   ALKPHOS 90 03/25/2021   BILITOT 0.3 03/25/2021   Lab Results  Component Value Date   HGBA1C 5.5 03/25/2021   Lab Results  Component Value Date   INSULIN 8.5 03/25/2021   No results found for: TSH  Lab Results  Component Value Date   CHOL 216 (H) 03/25/2021   HDL 67 03/25/2021   LDLCALC 130 (H) 03/25/2021   TRIG 106 03/25/2021   Lab Results  Component Value Date   VD25OH 10.5 (L) 03/25/2021   Lab Results  Component Value Date   WBC 7.0 01/10/2014   HGB 9.9 (L) 01/10/2014   HCT 33.8 (L) 03/25/2021   MCV 78.8 01/10/2014   PLT 396 01/10/2014    Lab Results  Component Value Date   IRON 21 (L) 03/25/2021   TIBC 438 03/25/2021   FERRITIN 9 (L) 03/25/2021   Attestation Statements:   Reviewed by clinician on day of visit: allergies, medications, problem list, medical history, surgical history, family history, social history, and previous encounter notes.  I, Jackson Latino, RMA, am acting as Energy manager for [employer], DO.  I have reviewed the above documentation for accuracy and completeness, and I agree with the above. Corinna Capra, DO

## 2021-08-26 ENCOUNTER — Ambulatory Visit (INDEPENDENT_AMBULATORY_CARE_PROVIDER_SITE_OTHER): Payer: 59 | Admitting: Bariatrics

## 2021-08-26 ENCOUNTER — Other Ambulatory Visit: Payer: Self-pay

## 2021-08-26 ENCOUNTER — Encounter (INDEPENDENT_AMBULATORY_CARE_PROVIDER_SITE_OTHER): Payer: Self-pay | Admitting: Bariatrics

## 2021-08-26 ENCOUNTER — Other Ambulatory Visit (HOSPITAL_BASED_OUTPATIENT_CLINIC_OR_DEPARTMENT_OTHER): Payer: Self-pay

## 2021-08-26 VITALS — BP 107/73 | HR 80 | Temp 97.9°F | Ht 61.0 in | Wt 183.0 lb

## 2021-08-26 DIAGNOSIS — E559 Vitamin D deficiency, unspecified: Secondary | ICD-10-CM | POA: Diagnosis not present

## 2021-08-26 DIAGNOSIS — E6609 Other obesity due to excess calories: Secondary | ICD-10-CM

## 2021-08-26 DIAGNOSIS — R632 Polyphagia: Secondary | ICD-10-CM

## 2021-08-26 DIAGNOSIS — E669 Obesity, unspecified: Secondary | ICD-10-CM | POA: Diagnosis not present

## 2021-08-26 DIAGNOSIS — Z6834 Body mass index (BMI) 34.0-34.9, adult: Secondary | ICD-10-CM | POA: Diagnosis not present

## 2021-08-26 MED ORDER — TIRZEPATIDE 7.5 MG/0.5ML ~~LOC~~ SOAJ
7.5000 mg | SUBCUTANEOUS | 0 refills | Status: DC
  Filled 2021-08-26 – 2021-08-27 (×2): qty 2, 28d supply, fill #0

## 2021-08-27 ENCOUNTER — Other Ambulatory Visit (HOSPITAL_BASED_OUTPATIENT_CLINIC_OR_DEPARTMENT_OTHER): Payer: Self-pay

## 2021-08-30 ENCOUNTER — Encounter (INDEPENDENT_AMBULATORY_CARE_PROVIDER_SITE_OTHER): Payer: Self-pay | Admitting: Bariatrics

## 2021-08-30 NOTE — Progress Notes (Signed)
Chief Complaint:   OBESITY Shelley Ramirez is here to discuss her progress with her obesity treatment plan along with follow-up of her obesity related diagnoses. Shelley Ramirez is on the Category 1 Plan and states she is following her eating plan approximately 85% of the time. Shelley Ramirez states she is doing 0 minutes 0 times per week.  Today's visit was #: 10 Starting weight: 205 lbs Starting date: 03/25/2021 Today's weight: 183 lbs Today's date:2//08/2021 Total lbs lost to date: 22 lbs Total lbs lost since last in-office visit: 7 lbs  Interim History: Shelley Ramirez is down 7 lbs since her last visit and doing well overall. She did fairly for all of January. She has achieved her water intake.   Subjective:   1. Polyphagia Shelley Ramirez's appetite is controlled.  2. Vitamin D deficiency Shelley Ramirez is taking OTC Vitamin D currently.  Assessment/Plan:   1. Polyphagia Intensive lifestyle modifications are the first line treatment for this issue. We will refill Mounjaro 7.5 mg for 1 month with no refills. We discussed several lifestyle modifications today and she will continue to work on diet, exercise and weight loss efforts. Orders and follow up as documented in patient record.  Counseling Polyphagia is excessive hunger. Causes can include: low blood sugars, hypERthyroidism, PMS, lack of sleep, stress, insulin resistance, diabetes, certain medications, and diets that are deficient in protein and fiber.    - tirzepatide (MOUNJARO) 7.5 MG/0.5ML Pen; Inject 7.5 mg into the skin once a week.  Dispense: 2 mL; Refill: 0  2. Vitamin D deficiency Low Vitamin D level contributes to fatigue and are associated with obesity, breast, and colon cancer. Shelley Ramirez agrees to continue to take OTC Vitamin D and she will follow-up for routine testing of Vitamin D, at least 2-3 times per year to avoid over-replacement.  3. Obesity, current BMI 34.6 Shelley Ramirez is currently in the action stage of change. As such, her goal is to continue with weight  loss efforts. She has agreed to the Category 1 Plan.   Shelley Ramirez will continue meal planning and she will continue intentional eating. She will increase her water high protein.  Exercise goals:  Shelley Ramirez will start Zumba.  Behavioral modification strategies: increasing lean protein intake, decreasing simple carbohydrates, increasing vegetables, increasing water intake, decreasing eating out, no skipping meals, meal planning and cooking strategies, keeping healthy foods in the home, and planning for success.  Shelley Ramirez has agreed to follow-up with our clinic in 3-4 weeks. She was informed of the importance of frequent follow-up visits to maximize her success with intensive lifestyle modifications for her multiple health conditions.   Objective:   Blood pressure 107/73, pulse 80, temperature 97.9 F (36.6 C), height 5\' 1"  (1.549 m), weight 183 lb (83 kg), SpO2 99 %, unknown if currently breastfeeding. Body mass index is 34.58 kg/m.  General: Cooperative, alert, well developed, in no acute distress. HEENT: Conjunctivae and lids unremarkable. Cardiovascular: Regular rhythm.  Lungs: Normal work of breathing. Neurologic: No focal deficits.   Lab Results  Component Value Date   CREATININE 0.89 03/25/2021   BUN 8 03/25/2021   NA 139 03/25/2021   K 4.4 03/25/2021   CL 101 03/25/2021   CO2 23 03/25/2021   Lab Results  Component Value Date   ALT 23 03/25/2021   AST 27 03/25/2021   ALKPHOS 90 03/25/2021   BILITOT 0.3 03/25/2021   Lab Results  Component Value Date   HGBA1C 5.5 03/25/2021   Lab Results  Component Value Date   INSULIN 8.5  03/25/2021   No results found for: TSH Lab Results  Component Value Date   CHOL 216 (H) 03/25/2021   HDL 67 03/25/2021   LDLCALC 130 (H) 03/25/2021   TRIG 106 03/25/2021   Lab Results  Component Value Date   VD25OH 10.5 (L) 03/25/2021   Lab Results  Component Value Date   WBC 7.0 01/10/2014   HGB 9.9 (L) 01/10/2014   HCT 33.8 (L) 03/25/2021    MCV 78.8 01/10/2014   PLT 396 01/10/2014   Lab Results  Component Value Date   IRON 21 (L) 03/25/2021   TIBC 438 03/25/2021   FERRITIN 9 (L) 03/25/2021   Attestation Statements:   Reviewed by clinician on day of visit: allergies, medications, problem list, medical history, surgical history, family history, social history, and previous encounter notes.  I, Jackson Latino, RMA, am acting as Energy manager for [employer], DO.  I have reviewed the above documentation for accuracy and completeness, and I agree with the above. Corinna Capra, DO

## 2021-09-01 ENCOUNTER — Other Ambulatory Visit (HOSPITAL_BASED_OUTPATIENT_CLINIC_OR_DEPARTMENT_OTHER): Payer: Self-pay

## 2021-09-14 ENCOUNTER — Other Ambulatory Visit (HOSPITAL_BASED_OUTPATIENT_CLINIC_OR_DEPARTMENT_OTHER): Payer: Self-pay

## 2021-09-14 DIAGNOSIS — N926 Irregular menstruation, unspecified: Secondary | ICD-10-CM | POA: Diagnosis not present

## 2021-09-14 DIAGNOSIS — J209 Acute bronchitis, unspecified: Secondary | ICD-10-CM | POA: Diagnosis not present

## 2021-09-14 MED ORDER — PREDNISONE 20 MG PO TABS
ORAL_TABLET | ORAL | 0 refills | Status: DC
  Filled 2021-09-14: qty 10, 5d supply, fill #0

## 2021-09-14 MED ORDER — ALBUTEROL SULFATE HFA 108 (90 BASE) MCG/ACT IN AERS
INHALATION_SPRAY | RESPIRATORY_TRACT | 0 refills | Status: DC
  Filled 2021-09-14: qty 8.5, 25d supply, fill #0

## 2021-09-14 MED ORDER — AZITHROMYCIN 250 MG PO TABS
ORAL_TABLET | ORAL | 0 refills | Status: DC
  Filled 2021-09-14: qty 6, 5d supply, fill #0

## 2021-09-16 ENCOUNTER — Ambulatory Visit (INDEPENDENT_AMBULATORY_CARE_PROVIDER_SITE_OTHER): Payer: 59 | Admitting: Bariatrics

## 2021-09-16 ENCOUNTER — Encounter (INDEPENDENT_AMBULATORY_CARE_PROVIDER_SITE_OTHER): Payer: Self-pay | Admitting: Bariatrics

## 2021-09-16 ENCOUNTER — Other Ambulatory Visit (HOSPITAL_BASED_OUTPATIENT_CLINIC_OR_DEPARTMENT_OTHER): Payer: Self-pay

## 2021-09-16 ENCOUNTER — Other Ambulatory Visit: Payer: Self-pay

## 2021-09-16 VITALS — BP 121/72 | HR 80 | Temp 97.9°F | Ht 61.0 in | Wt 185.0 lb

## 2021-09-16 DIAGNOSIS — R632 Polyphagia: Secondary | ICD-10-CM

## 2021-09-16 DIAGNOSIS — Z6835 Body mass index (BMI) 35.0-35.9, adult: Secondary | ICD-10-CM

## 2021-09-16 DIAGNOSIS — E669 Obesity, unspecified: Secondary | ICD-10-CM

## 2021-09-16 DIAGNOSIS — E559 Vitamin D deficiency, unspecified: Secondary | ICD-10-CM | POA: Diagnosis not present

## 2021-09-16 MED ORDER — TIRZEPATIDE 7.5 MG/0.5ML ~~LOC~~ SOAJ
7.5000 mg | SUBCUTANEOUS | 0 refills | Status: DC
  Filled 2021-09-16 – 2021-09-20 (×4): qty 2, 28d supply, fill #0

## 2021-09-16 NOTE — Progress Notes (Signed)
Chief Complaint:   OBESITY Shelley Ramirez is here to discuss her progress with her obesity treatment plan along with follow-up of her obesity related diagnoses. Shelley Ramirez is on the Category 1 Plan and states she is following her eating plan approximately 90% of the time. Tekla states she is doing 0 minutes 0 times per week.  Today's visit was #: 11 Starting weight: 205 lbs Starting date: 03/25/2021 Today's weight: 185 lbs Today's date: 09/16/2021 Total lbs lost to date: 20 Total lbs lost since last in-office visit: 0  Interim History: Shelley Ramirez is up 2 lbs since her last visit, but she has been sick with a respiratory condition. She is taking antibiotics, prednisone, and albuterol.  Subjective:   1. Polyphagia Frady is taking Mounjaro.  2. Vitamin D deficiency Harolyn's Vit D level is currently controlled.  Assessment/Plan:   1. Polyphagia Intensive lifestyle modifications are the first line treatment for this issue. We discussed several lifestyle modifications today. We will refill Mounjaro for 1 month. Myron will continue to work on diet, exercise and weight loss efforts. Orders and follow up as documented in patient record.  Counseling Polyphagia is excessive hunger. Causes can include: low blood sugars, hypERthyroidism, PMS, lack of sleep, stress, insulin resistance, diabetes, certain medications, and diets that are deficient in protein and fiber.   - tirzepatide (MOUNJARO) 7.5 MG/0.5ML Pen; Inject 7.5 mg into the skin once a week.  Dispense: 2 mL; Refill: 0  2. Vitamin D deficiency Low Vitamin D level contributes to fatigue and are associated with obesity, breast, and colon cancer. Shelley Ramirez will continue OTC Vitamin D and will follow-up for routine testing of Vitamin D, at least 2-3 times per year to avoid over-replacement.  3. Obesity, with current BMI of 35.1 Shelley Ramirez is currently in the action stage of change. As such, her goal is to continue with weight loss efforts. She has agreed to the  Category 1 Plan.   Intentional eating. Will continue to adhere closely to the plan 80-90%. We will recheck fasting labs at her next visit.  Exercise goals: No exercise has been prescribed at this time.  Behavioral modification strategies: increasing lean protein intake, decreasing simple carbohydrates, increasing vegetables, increasing water intake, decreasing eating out, no skipping meals, meal planning and cooking strategies, keeping healthy foods in the home, and planning for success.  Shelley Ramirez has agreed to follow-up with our clinic in 3 to 4 weeks. She was informed of the importance of frequent follow-up visits to maximize her success with intensive lifestyle modifications for her multiple health conditions.   Objective:   Blood pressure 121/72, pulse 80, temperature 97.9 F (36.6 C), height 5\' 1"  (1.549 m), weight 185 lb (83.9 kg), SpO2 99 %, unknown if currently breastfeeding. Body mass index is 34.96 kg/m.  General: Cooperative, alert, well developed, in no acute distress. HEENT: Conjunctivae and lids unremarkable. Cardiovascular: Regular rhythm.  Lungs: Normal work of breathing. Neurologic: No focal deficits.   Lab Results  Component Value Date   CREATININE 0.89 03/25/2021   BUN 8 03/25/2021   NA 139 03/25/2021   K 4.4 03/25/2021   CL 101 03/25/2021   CO2 23 03/25/2021   Lab Results  Component Value Date   ALT 23 03/25/2021   AST 27 03/25/2021   ALKPHOS 90 03/25/2021   BILITOT 0.3 03/25/2021   Lab Results  Component Value Date   HGBA1C 5.5 03/25/2021   Lab Results  Component Value Date   INSULIN 8.5 03/25/2021   No results  found for: TSH Lab Results  Component Value Date   CHOL 216 (H) 03/25/2021   HDL 67 03/25/2021   LDLCALC 130 (H) 03/25/2021   TRIG 106 03/25/2021   Lab Results  Component Value Date   VD25OH 10.5 (L) 03/25/2021   Lab Results  Component Value Date   WBC 7.0 01/10/2014   HGB 9.9 (L) 01/10/2014   HCT 33.8 (L) 03/25/2021   MCV 78.8  01/10/2014   PLT 396 01/10/2014   Lab Results  Component Value Date   IRON 21 (L) 03/25/2021   TIBC 438 03/25/2021   FERRITIN 9 (L) 03/25/2021   Attestation Statements:   Reviewed by clinician on day of visit: allergies, medications, problem list, medical history, surgical history, family history, social history, and previous encounter notes.   Wilhemena Durie, am acting as Location manager for [employer], DO.  I have reviewed the above documentation for accuracy and completeness, and I agree with the above. Jearld Lesch, DO

## 2021-09-17 ENCOUNTER — Other Ambulatory Visit (HOSPITAL_BASED_OUTPATIENT_CLINIC_OR_DEPARTMENT_OTHER): Payer: Self-pay

## 2021-09-20 ENCOUNTER — Other Ambulatory Visit (HOSPITAL_BASED_OUTPATIENT_CLINIC_OR_DEPARTMENT_OTHER): Payer: Self-pay

## 2021-09-20 ENCOUNTER — Encounter (INDEPENDENT_AMBULATORY_CARE_PROVIDER_SITE_OTHER): Payer: Self-pay | Admitting: Bariatrics

## 2021-10-07 ENCOUNTER — Other Ambulatory Visit: Payer: Self-pay

## 2021-10-07 ENCOUNTER — Other Ambulatory Visit (HOSPITAL_BASED_OUTPATIENT_CLINIC_OR_DEPARTMENT_OTHER): Payer: Self-pay

## 2021-10-07 ENCOUNTER — Ambulatory Visit (INDEPENDENT_AMBULATORY_CARE_PROVIDER_SITE_OTHER): Payer: 59 | Admitting: Bariatrics

## 2021-10-07 ENCOUNTER — Encounter (INDEPENDENT_AMBULATORY_CARE_PROVIDER_SITE_OTHER): Payer: Self-pay | Admitting: Bariatrics

## 2021-10-07 VITALS — BP 122/87 | HR 88 | Temp 97.8°F | Ht 61.0 in | Wt 182.0 lb

## 2021-10-07 DIAGNOSIS — E559 Vitamin D deficiency, unspecified: Secondary | ICD-10-CM | POA: Diagnosis not present

## 2021-10-07 DIAGNOSIS — E7800 Pure hypercholesterolemia, unspecified: Secondary | ICD-10-CM

## 2021-10-07 DIAGNOSIS — Z6834 Body mass index (BMI) 34.0-34.9, adult: Secondary | ICD-10-CM | POA: Diagnosis not present

## 2021-10-07 DIAGNOSIS — D508 Other iron deficiency anemias: Secondary | ICD-10-CM

## 2021-10-07 DIAGNOSIS — R7309 Other abnormal glucose: Secondary | ICD-10-CM | POA: Diagnosis not present

## 2021-10-07 DIAGNOSIS — R632 Polyphagia: Secondary | ICD-10-CM

## 2021-10-07 DIAGNOSIS — E669 Obesity, unspecified: Secondary | ICD-10-CM | POA: Diagnosis not present

## 2021-10-07 MED ORDER — TIRZEPATIDE 7.5 MG/0.5ML ~~LOC~~ SOAJ: 7.5000 mg | SUBCUTANEOUS | 0 refills | Status: DC

## 2021-10-07 MED ORDER — TIRZEPATIDE 7.5 MG/0.5ML ~~LOC~~ SOAJ
7.5000 mg | SUBCUTANEOUS | 0 refills | Status: DC
  Filled 2021-10-07: qty 2, 28d supply, fill #0

## 2021-10-08 LAB — COMPREHENSIVE METABOLIC PANEL
ALT: 11 IU/L (ref 0–32)
AST: 15 IU/L (ref 0–40)
Albumin/Globulin Ratio: 1.4 (ref 1.2–2.2)
Albumin: 4.2 g/dL (ref 3.8–4.8)
Alkaline Phosphatase: 73 IU/L (ref 44–121)
BUN/Creatinine Ratio: 9 (ref 9–23)
BUN: 8 mg/dL (ref 6–24)
Bilirubin Total: 0.3 mg/dL (ref 0.0–1.2)
CO2: 23 mmol/L (ref 20–29)
Calcium: 9.4 mg/dL (ref 8.7–10.2)
Chloride: 103 mmol/L (ref 96–106)
Creatinine, Ser: 0.86 mg/dL (ref 0.57–1.00)
Globulin, Total: 3 g/dL (ref 1.5–4.5)
Glucose: 74 mg/dL (ref 70–99)
Potassium: 4.1 mmol/L (ref 3.5–5.2)
Sodium: 139 mmol/L (ref 134–144)
Total Protein: 7.2 g/dL (ref 6.0–8.5)
eGFR: 87 mL/min/{1.73_m2} (ref >59)

## 2021-10-08 LAB — ANEMIA PANEL
Ferritin: 9 ng/mL — ABNORMAL LOW (ref 15–150)
Folate, Hemolysate: 294 ng/mL
Folate, RBC: 894 ng/mL (ref >498)
Hematocrit: 32.9 % — ABNORMAL LOW (ref 34.0–46.6)
Iron Saturation: 5 % — CL (ref 15–55)
Iron: 22 ug/dL — ABNORMAL LOW (ref 27–159)
Retic Ct Pct: 1.2 % (ref 0.6–2.6)
Total Iron Binding Capacity: 415 ug/dL (ref 250–450)
UIBC: 393 ug/dL (ref 131–425)
Vitamin B-12: 384 pg/mL (ref 232–1245)

## 2021-10-08 LAB — LIPID PANEL WITH LDL/HDL RATIO
Cholesterol, Total: 188 mg/dL (ref 100–199)
HDL: 45 mg/dL (ref >39)
LDL Chol Calc (NIH): 119 mg/dL — ABNORMAL HIGH (ref 0–99)
LDL/HDL Ratio: 2.6 ratio (ref 0.0–3.2)
Triglycerides: 136 mg/dL (ref 0–149)
VLDL Cholesterol Cal: 24 mg/dL (ref 5–40)

## 2021-10-08 LAB — INSULIN, RANDOM: INSULIN: 10.2 u[IU]/mL (ref 2.6–24.9)

## 2021-10-08 LAB — HEMOGLOBIN A1C
Est. average glucose Bld gHb Est-mCnc: 103 mg/dL
Hgb A1c MFr Bld: 5.2 % (ref 4.8–5.6)

## 2021-10-08 LAB — VITAMIN D 25 HYDROXY (VIT D DEFICIENCY, FRACTURES): Vit D, 25-Hydroxy: 15 ng/mL — ABNORMAL LOW (ref 30.0–100.0)

## 2021-10-11 ENCOUNTER — Encounter: Payer: Self-pay | Admitting: Neurology

## 2021-10-11 NOTE — Progress Notes (Signed)
Pt advised and referral sent

## 2021-10-11 NOTE — Progress Notes (Signed)
Left msg for pt to call back regarding labs

## 2021-10-12 ENCOUNTER — Other Ambulatory Visit: Payer: Self-pay | Admitting: Family

## 2021-10-12 ENCOUNTER — Encounter (INDEPENDENT_AMBULATORY_CARE_PROVIDER_SITE_OTHER): Payer: Self-pay | Admitting: Bariatrics

## 2021-10-12 DIAGNOSIS — D649 Anemia, unspecified: Secondary | ICD-10-CM

## 2021-10-12 NOTE — Progress Notes (Signed)
? ? ? ?Chief Complaint:  ? ?OBESITY ?Shelley Ramirez is here to discuss her progress with her obesity treatment plan along with follow-up of her obesity related diagnoses. Shelley Ramirez is on the Category 1 Plan and states she is following her eating plan approximately 85% of the time. Shelley Ramirez states she is doing 0 minutes 0 times per week. ? ?Today's visit was #: 12 ?Starting weight: 205 lbs ?Starting date: 03/25/2021 ?Today's weight: 182 lbs ?Today's date: 10/07/2021 ?Total lbs lost to date: 23 lbs ?Total lbs lost since last in-office visit: 3 lbs ? ?Interim History: Shelley Ramirez is down another 3 lbs.  ? ?Subjective:  ? ?1. Polyphagia ?Shelley Ramirez denies side effects from [facility].  ? ?2. Elevated cholesterol ?Shelley Ramirez had elevated cholesterol.  ? ?3. Other iron deficiency anemia ?Shelley Ramirez is not on iron currently.  ? ?4. Vitamin D deficiency ?Shelley Ramirez is taking Vitamin D currently.  ? ?5. Elevated glucose ?Shelley Ramirez is currently not on medication.  ? ?Assessment/Plan:  ? ?1. Polyphagia ?We will refill Mounjaro 7.5 mg for 1 month with no refills. We will check CMP, A1C, and Insulin today. Intensive lifestyle modifications are the first line treatment for this issue. We discussed several lifestyle modifications today and she will continue to work on diet, exercise and weight loss efforts. Orders and follow up as documented in patient record. ? ?Counseling ?Polyphagia is excessive hunger. ?Causes can include: low blood sugars, hypERthyroidism, PMS, lack of sleep, stress, insulin resistance, diabetes, certain medications, and diets that are deficient in protein and fiber.   ? ?- Comprehensive metabolic panel ?- Hemoglobin A1c ?- Insulin, random ? ?- tirzepatide (MOUNJARO) 7.5 MG/0.5ML Pen; Inject 7.5 mg into the skin once a week.  Dispense: 2 mL; Refill: 0 ? ?2. Elevated cholesterol ?Cardiovascular risk and specific lipid/LDL goals reviewed.  We will check Cholesterol at next visit. We discussed several lifestyle modifications today and Shelley Ramirez will continue to  work on diet, exercise and weight loss efforts. Orders and follow up as documented in patient record.  ? ?Counseling ?Intensive lifestyle modifications are the first line treatment for this issue. ?Dietary changes: Increase soluble fiber. Decrease simple carbohydrates. ?Exercise changes: Moderate to vigorous-intensity aerobic activity 150 minutes per week if tolerated. ?Lipid-lowering medications: see documented in medical record. ?- Comprehensive metabolic panel ?- Lipid Panel With LDL/HDL Ratio ? ?3. Other iron deficiency anemia ?Orders and follow up as documented in patient record.We will check Anemia panel and Ferritin today. Referral to Hematology/Oncology. ? ?Counseling ?Iron is essential for our bodies to make red blood cells.  Reasons that someone may be deficient include: an iron-deficient diet (more likely in those following vegan or vegetarian diets), women with heavy menses, patients with GI disorders or poor absorption, patients that have had bariatric surgery, frequent blood donors, patients with cancer, and patients with heart disease.   ?An iron supplement has been recommended. This is found over-the-counter.  ?Iron-rich foods include dark leafy greens, red and white meats, eggs, seafood, and beans.   ?Certain foods and drinks prevent your body from absorbing iron properly. Avoid eating these foods in the same meal as iron-rich foods or with iron supplements. These foods include: coffee, black tea, and red wine; milk, dairy products, and foods that are high in calcium; beans and soybeans; whole grains.  ?Constipation can be a side effect of iron supplementation. Increased water and fiber intake are helpful. Water goal: > 2 liters/day. Fiber goal: > 25 grams/day.  ? ?- Anemia panel ?- Ferritin ?- Ambulatory referral to Hematology /  Oncology ? ?4. Vitamin D deficiency ?Low Vitamin D level contributes to fatigue and are associated with obesity, breast, and colon cancer. We will check Vitamin D today and  Shelley Ramirez will follow-up for routine testing of Vitamin D, at least 2-3 times per year to avoid over-replacement.. ? ?- VITAMIN D 25 Hydroxy (Vit-D Deficiency, Fractures) ? ?5. Elevated glucose ?We will check A1C and insulin today.  ? ?- Hemoglobin A1c ?- Insulin, random ? ?6. Obesity, with current BMI of 34.5 ?Shelley Ramirez is currently in the action stage of change. As such, her goal is to continue with weight loss efforts. She has agreed to the Category 1 Plan.  ? ?Shelley Ramirez will continue meal planning and she will continue meal planning.  ? ?Exercise goals:  Shelley Ramirez will start zumba classes at the gym.  ? ?Behavioral modification strategies: increasing lean protein intake, decreasing simple carbohydrates, increasing vegetables, increasing water intake, decreasing eating out, no skipping meals, meal planning and cooking strategies, keeping healthy foods in the home, and planning for success. ? ?Shelley Ramirez has agreed to follow-up with our clinic in 3-4 weeks(fasting). She was informed of the importance of frequent follow-up visits to maximize her success with intensive lifestyle modifications for her multiple health conditions.  ? ?Shelley Ramirez was informed we would discuss her lab results at her next visit unless there is a critical issue that needs to be addressed sooner. Shelley Ramirez agreed to keep her next visit at the agreed upon time to discuss these results. ? ?Objective:  ? ?Blood pressure 122/87, pulse 88, temperature 97.8 ?F (36.6 ?C), height 5\' 1"  (1.549 m), weight 182 lb (82.6 kg), SpO2 99 %, unknown if currently breastfeeding. ?Body mass index is 34.39 kg/m?. ? ?General: Cooperative, alert, well developed, in no acute distress. ?HEENT: Conjunctivae and lids unremarkable. ?Cardiovascular: Regular rhythm.  ?Lungs: Normal work of breathing. ?Neurologic: No focal deficits.  ? ?Lab Results  ?Component Value Date  ? CREATININE 0.86 10/07/2021  ? BUN 8 10/07/2021  ? NA 139 10/07/2021  ? K 4.1 10/07/2021  ? CL 103 10/07/2021  ? CO2 23 10/07/2021   ? ?Lab Results  ?Component Value Date  ? ALT 11 10/07/2021  ? AST 15 10/07/2021  ? ALKPHOS 73 10/07/2021  ? BILITOT 0.3 10/07/2021  ? ?Lab Results  ?Component Value Date  ? HGBA1C 5.2 10/07/2021  ? HGBA1C 5.5 03/25/2021  ? ?Lab Results  ?Component Value Date  ? INSULIN 10.2 10/07/2021  ? INSULIN 8.5 03/25/2021  ? ?No results found for: TSH ?Lab Results  ?Component Value Date  ? CHOL 188 10/07/2021  ? HDL 45 10/07/2021  ? LDLCALC 119 (H) 10/07/2021  ? TRIG 136 10/07/2021  ? ?Lab Results  ?Component Value Date  ? VD25OH 15.0 (L) 10/07/2021  ? VD25OH 10.5 (L) 03/25/2021  ? ?Lab Results  ?Component Value Date  ? WBC 7.0 01/10/2014  ? HGB 9.9 (L) 01/10/2014  ? HCT 32.9 (L) 10/07/2021  ? MCV 78.8 01/10/2014  ? PLT 396 01/10/2014  ? ?Lab Results  ?Component Value Date  ? IRON 22 (L) 10/07/2021  ? TIBC 415 10/07/2021  ? FERRITIN 9 (L) 10/07/2021  ? ?Attestation Statements:  ? ?Reviewed by clinician on day of visit: allergies, medications, problem list, medical history, surgical history, family history, social history, and previous encounter notes. ? ?I, Lizbeth Bark, RMA, am acting as transcriptionist for [employer], DO. ? ?I have reviewed the above documentation for accuracy and completeness, and I agree with the above. Jearld Lesch, DO ? ? ?

## 2021-10-13 ENCOUNTER — Inpatient Hospital Stay: Payer: 59 | Attending: Hematology & Oncology

## 2021-10-13 ENCOUNTER — Inpatient Hospital Stay: Payer: 59 | Admitting: Family

## 2021-10-13 ENCOUNTER — Other Ambulatory Visit: Payer: Self-pay

## 2021-10-13 ENCOUNTER — Telehealth: Payer: Self-pay | Admitting: *Deleted

## 2021-10-13 ENCOUNTER — Encounter: Payer: Self-pay | Admitting: Family

## 2021-10-13 VITALS — BP 107/68 | HR 76 | Temp 98.0°F | Resp 17 | Ht 61.5 in | Wt 186.8 lb

## 2021-10-13 DIAGNOSIS — D500 Iron deficiency anemia secondary to blood loss (chronic): Secondary | ICD-10-CM

## 2021-10-13 DIAGNOSIS — N920 Excessive and frequent menstruation with regular cycle: Secondary | ICD-10-CM | POA: Diagnosis not present

## 2021-10-13 DIAGNOSIS — D649 Anemia, unspecified: Secondary | ICD-10-CM

## 2021-10-13 DIAGNOSIS — Z803 Family history of malignant neoplasm of breast: Secondary | ICD-10-CM | POA: Diagnosis not present

## 2021-10-13 LAB — CBC WITH DIFFERENTIAL (CANCER CENTER ONLY)
Abs Immature Granulocytes: 0.01 10*3/uL (ref 0.00–0.07)
Basophils Absolute: 0 10*3/uL (ref 0.0–0.1)
Basophils Relative: 1 %
Eosinophils Absolute: 0.4 10*3/uL (ref 0.0–0.5)
Eosinophils Relative: 6 %
HCT: 30.7 % — ABNORMAL LOW (ref 36.0–46.0)
Hemoglobin: 9.6 g/dL — ABNORMAL LOW (ref 12.0–15.0)
Immature Granulocytes: 0 %
Lymphocytes Relative: 48 %
Lymphs Abs: 3 10*3/uL (ref 0.7–4.0)
MCH: 24.6 pg — ABNORMAL LOW (ref 26.0–34.0)
MCHC: 31.3 g/dL (ref 30.0–36.0)
MCV: 78.5 fL — ABNORMAL LOW (ref 80.0–100.0)
Monocytes Absolute: 0.7 10*3/uL (ref 0.1–1.0)
Monocytes Relative: 11 %
Neutro Abs: 2.1 10*3/uL (ref 1.7–7.7)
Neutrophils Relative %: 34 %
Platelet Count: 519 10*3/uL — ABNORMAL HIGH (ref 150–400)
RBC: 3.91 MIL/uL (ref 3.87–5.11)
RDW: 18.2 % — ABNORMAL HIGH (ref 11.5–15.5)
WBC Count: 6.1 10*3/uL (ref 4.0–10.5)
nRBC: 0 % (ref 0.0–0.2)

## 2021-10-13 LAB — RETICULOCYTES
Immature Retic Fract: 23.2 % — ABNORMAL HIGH (ref 2.3–15.9)
RBC.: 3.84 MIL/uL — ABNORMAL LOW (ref 3.87–5.11)
Retic Count, Absolute: 63.4 10*3/uL (ref 19.0–186.0)
Retic Ct Pct: 1.7 % (ref 0.4–3.1)

## 2021-10-13 NOTE — Telephone Encounter (Signed)
Per 10/13/21 los - gave upcoming appointments - confirmed - attempted to schedule (3) doses of IV Iron - 5 days out due to insurance authorization - patient declined - wants iron infusion sooner. Will call patient if approved sooner. ?

## 2021-10-13 NOTE — Progress Notes (Signed)
Hematology/Oncology Consultation  ? ?Name: Shelley Ramirez      MRN: [id number]    Location: Room/bed info not found  Date: 10/13/2021 Time:2:57 PM ? ? ?REFERRING PHYSICIAN: Corinna Capra, DO  ? ?REASON FOR CONSULT: Other iron deficiency  ?  ?DIAGNOSIS: Iron deficiency anemia with heavy cycles ? ?HISTORY OF PRESENT ILLNESS: [name] is a pleasant 41 yo African American female with long history of iron deficiency anemia.  ?She was unable to tolerate oral iron in the past.  ?She states that she has a regular but heavy cycle with lots of clots last 7 days.  ?No other blood loss noted. No bruising or petechiae.  ?Iron saturation last week was 5% and ferritin 9.  ?She is symptomatic with fatigue, insomnia, craving/chewing ice, more frequent migraines, SOB with over exertion and occasional dizziness.  ?Her sister also has anemia.  ?No known history of sickle cell disease or trait.  ?No personal history of cancer. She had 2 maternal aunts with history of breast cancer.  ?Mammogram in 2022 was negative.  ?She has never had a colonoscopy.  ?She does not have much of an appetite on the [facility] and has not eaten red meat in over 20 years.  ?She is doing her best to stay well hydrated throughout the day.  ?Her weight is stable at 186 lbs.  ?No history of diabetes or thyroid disease.  ?She has 3 children and had miscarriages in 2016 and 2022 both within first [redacted] weeks gestation.  ?No fever, chills, n/v, cough, rash, chest pain, palpitations, abdominal pain or changes in bowel or bladder habit.  ?No swelling, tenderness, numbness or tingling in her extremities at this time.  ?No falls or syncope.  ?No smoking, ETOH or recreational drug use.  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  ? ?ROS: All other 10 point review of systems is negative.  ? ?PAST MEDICAL HISTORY:   ?Past Medical History:  ?Diagnosis Date  ? Acid reflux   ? Anemia   ? Lactose intolerance   ? Other fatigue   ? SOB (shortness of breath) on exertion   ?  Vitamin D deficiency   ? ? ?ALLERGIES: ?Allergies  ?Allergen Reactions  ? Codeine Shortness Of Breath and Other (See Comments)  ?  Hyperventilation, pass out, loss off consciousness    ? ?   ?MEDICATIONS:  ?Current Outpatient Medications on File Prior to Visit  ?Medication Sig Dispense Refill  ? albuterol (VENTOLIN HFA) 108 (90 Base) MCG/ACT inhaler Inhale 2 puffs by mouth every 6 hours as needed for cough or wheezing into the lungs. 8.5 g 0  ? amitriptyline (ELAVIL) 25 MG tablet Take 25 mg by mouth at bedtime as needed for sleep.    ? azithromycin (ZITHROMAX) 250 MG tablet Take 2 tablets by mouth on day 1, then take 1 tablet daily for 4 days 6 tablet 0  ? EPINEPHrine (EPIPEN 2-PAK) 0.3 mg/0.3 mL IJ SOAJ injection Inject 0.3 mLs (0.3 mg total) into the muscle as needed for anaphylaxis. Use in event of severe allergic reaction (throat closing, unable to breath, about to pass out or faint). 1 each 0  ? ondansetron (ZOFRAN ODT) 4 MG disintegrating tablet Take 1 tablet (4 mg total) by mouth every 8 (eight) hours as needed for nausea or vomiting. 20 tablet 0  ? predniSONE (DELTASONE) 20 MG tablet Take 1 tablet by mouth 2 times daily with food 10 tablet 0  ? SUMAtriptan (IMITREX) 50 MG tablet Take 1  Tablet By Mouth 1-2 Times Daily 10 tablet 1  ? SUMAtriptan (IMITREX) 50 MG tablet Take 1 tablet by mouth at onset of migraine. May repeat in 2 hours if headache persists; limit 4 tablets in 24 hours by mouth. 30 tablet 1  ? tirzepatide (MOUNJARO) 7.5 MG/0.5ML Pen Inject 7.5 mg into the skin once a week. 2 mL 0  ? ?No current facility-administered medications on file prior to visit.  ? ?  ?PAST SURGICAL HISTORY ?Past Surgical History:  ?Procedure Laterality Date  ? CESAREAN SECTION    ? 14years ago  ? ? ?FAMILY HISTORY: ?Family History  ?Problem Relation Age of Onset  ? Hypertension Mother   ? Kidney disease Mother   ? Sudden death Mother   ? Stroke Father   ? Hypertension Father   ? Alcoholism Father   ? Cancer Maternal Aunt    ? Diabetes Maternal Aunt   ? ? ?SOCIAL HISTORY: ? reports that she has never smoked. She has never used smokeless tobacco. She reports that she does not drink alcohol and does not use drugs. ? ?PERFORMANCE STATUS: ?The patient's performance status is 1 - Symptomatic but completely ambulatory ? ?PHYSICAL EXAM: ?Most Recent Vital Signs: unknown if currently breastfeeding. ?BP 107/68 (BP Location: Right Arm, Patient Position: Sitting)   Pulse 76   Temp 98 ?F (36.7 ?C) (Oral)   Resp 17   Ht 5' 1.5" (1.562 m)   Wt 186 lb 12.8 oz (84.7 kg)   SpO2 100%   BMI 34.72 kg/m?  ? ?General Appearance:    Alert, cooperative, no distress, appears stated age  ?Head:    Normocephalic, without obvious abnormality, atraumatic  ?Eyes:    PERRL, conjunctiva/corneas clear, EOM's intact, fundi  ?  benign, both eyes  ?   ?   ?Throat:   Lips, mucosa, and tongue normal; teeth and gums normal  ?Neck:   Supple, symmetrical, trachea midline, no adenopathy;  ?  thyroid:  no enlargement/tenderness/nodules; no carotid ?  bruit or JVD  ?Back:     Symmetric, no curvature, ROM normal, no CVA tenderness  ?Lungs:     Clear to auscultation bilaterally, respirations unlabored  ?Chest Wall:    No tenderness or deformity  ? Heart:    Regular rate and rhythm, S1 and S2 normal, no murmur, rub   or gallop  ?   ?Abdomen:     Soft, non-tender, bowel sounds active all four quadrants,  ?  no masses, no organomegaly  ?   ?   ?Extremities:   Extremities normal, atraumatic, no cyanosis or edema  ?Pulses:   2+ and symmetric all extremities  ?Skin:   Skin color, texture, turgor normal, no rashes or lesions  ?Lymph nodes:   Cervical, supraclavicular, and axillary nodes normal  ?Neurologic:   CNII-XII intact, normal strength, sensation and reflexes  ?  throughout  ? ? ?LABORATORY DATA:  ?Results for orders placed or performed in visit on 10/13/21 (from the past 48 hour(s))  ?CBC with Differential (Cancer Center Only)     Status: Abnormal  ? Collection Time:  10/13/21  2:48 PM  ?Result Value Ref Range  ? WBC Count 6.1 4.0 - 10.5 K/uL  ? RBC 3.91 3.87 - 5.11 MIL/uL  ? Hemoglobin 9.6 (L) 12.0 - 15.0 g/dL  ?  Comment: Reticulocyte Hemoglobin testing ?may be clinically indicated, ?consider ordering this additional ?test [id number] ?  ? HCT 30.7 (L) 36.0 - 46.0 %  ? MCV 78.5 (L) 80.0 -  100.0 fL  ? MCH 24.6 (L) 26.0 - 34.0 pg  ? MCHC 31.3 30.0 - 36.0 g/dL  ? RDW 18.2 (H) 11.5 - 15.5 %  ? Platelet Count 519 (H) 150 - 400 K/uL  ? nRBC 0.0 0.0 - 0.2 %  ? Neutrophils Relative % 34 %  ? Neutro Abs 2.1 1.7 - 7.7 K/uL  ? Lymphocytes Relative 48 %  ? Lymphs Abs 3.0 0.7 - 4.0 K/uL  ? Monocytes Relative 11 %  ? Monocytes Absolute 0.7 0.1 - 1.0 K/uL  ? Eosinophils Relative 6 %  ? Eosinophils Absolute 0.4 0.0 - 0.5 K/uL  ? Basophils Relative 1 %  ? Basophils Absolute 0.0 0.0 - 0.1 K/uL  ? Immature Granulocytes 0 %  ? Abs Immature Granulocytes 0.01 0.00 - 0.07 K/uL  ?  Comment: Performed at [facility] at [facility], [address] [place], [place] [address]  ?   ? ?RADIOGRAPHY: ?No results found.   ?  ?PATHOLOGY: None ? ?ASSESSMENT/PLAN: [name] is a pleasant 41 yo African American female with long history of iron deficiency anemia with heavy cycle.  ?She plans to follow-up with her gynecologist regarding treatment for the heavy cycles. She states that they have discussed both the IUD and oral contraception.  ?We will get her set up for 3 doses of IV iron.  ?She may also benefit from folic acid. We will see what the rest of her anemia panel shows.  ?Follow-up in 6 weeks.  ? ?All questions were answered. The patient knows to call the clinic with any problems, questions or concerns. We can certainly see the patient much sooner if necessary. ? ?Eileen Stanford, NP ?  ? ? ? ?  ?

## 2021-10-15 LAB — HGB FRACTIONATION CASCADE
Hgb A2: 2.4 % (ref 1.8–3.2)
Hgb A: 97.6 % (ref 96.4–98.8)
Hgb F: 0 % (ref 0.0–2.0)
Hgb S: 0 %

## 2021-10-15 LAB — ERYTHROPOIETIN: Erythropoietin: 15.3 m[IU]/mL (ref 2.6–18.5)

## 2021-10-18 ENCOUNTER — Telehealth: Payer: Self-pay | Admitting: *Deleted

## 2021-10-18 ENCOUNTER — Other Ambulatory Visit: Payer: Self-pay | Admitting: Family

## 2021-10-18 NOTE — Telephone Encounter (Signed)
Per 10/13/21 los - called and lvm for call back to schedule (3) doses of IV Iron (venofer) ?

## 2021-10-19 ENCOUNTER — Other Ambulatory Visit: Payer: Self-pay

## 2021-10-19 ENCOUNTER — Inpatient Hospital Stay: Payer: 59

## 2021-10-19 ENCOUNTER — Ambulatory Visit: Payer: 59

## 2021-10-19 VITALS — BP 119/72 | HR 70 | Temp 98.2°F | Resp 18

## 2021-10-19 DIAGNOSIS — N920 Excessive and frequent menstruation with regular cycle: Secondary | ICD-10-CM | POA: Diagnosis not present

## 2021-10-19 DIAGNOSIS — Z803 Family history of malignant neoplasm of breast: Secondary | ICD-10-CM | POA: Diagnosis not present

## 2021-10-19 DIAGNOSIS — D508 Other iron deficiency anemias: Secondary | ICD-10-CM

## 2021-10-19 DIAGNOSIS — D500 Iron deficiency anemia secondary to blood loss (chronic): Secondary | ICD-10-CM | POA: Diagnosis not present

## 2021-10-19 MED ORDER — SODIUM CHLORIDE 0.9 % IV SOLN
300.0000 mg | Freq: Once | INTRAVENOUS | Status: AC
  Administered 2021-10-19: 300 mg via INTRAVENOUS
  Filled 2021-10-19: qty 300

## 2021-10-19 MED ORDER — SODIUM CHLORIDE 0.9 % IV SOLN: Freq: Once | INTRAVENOUS | Status: AC

## 2021-10-19 NOTE — Patient Instructions (Signed)

## 2021-10-26 ENCOUNTER — Ambulatory Visit: Payer: 59

## 2021-10-26 ENCOUNTER — Inpatient Hospital Stay: Payer: 59

## 2021-10-26 LAB — ALPHA-THALASSEMIA GENOTYPR

## 2021-10-27 ENCOUNTER — Ambulatory Visit: Payer: 59

## 2021-10-27 ENCOUNTER — Inpatient Hospital Stay: Payer: 59 | Attending: Hematology & Oncology

## 2021-10-27 VITALS — BP 96/60 | HR 78 | Temp 98.4°F | Resp 17

## 2021-10-27 DIAGNOSIS — N920 Excessive and frequent menstruation with regular cycle: Secondary | ICD-10-CM | POA: Insufficient documentation

## 2021-10-27 DIAGNOSIS — D500 Iron deficiency anemia secondary to blood loss (chronic): Secondary | ICD-10-CM | POA: Insufficient documentation

## 2021-10-27 DIAGNOSIS — D508 Other iron deficiency anemias: Secondary | ICD-10-CM

## 2021-10-27 MED ORDER — SODIUM CHLORIDE 0.9 % IV SOLN
300.0000 mg | Freq: Once | INTRAVENOUS | Status: AC
  Administered 2021-10-27: 300 mg via INTRAVENOUS
  Filled 2021-10-27: qty 300

## 2021-10-27 MED ORDER — SODIUM CHLORIDE 0.9% FLUSH: 10.0000 mL | Freq: Once | INTRAVENOUS | Status: DC | PRN

## 2021-10-27 MED ORDER — SODIUM CHLORIDE 0.9 % IV SOLN: Freq: Once | INTRAVENOUS | Status: AC

## 2021-10-27 MED ORDER — SODIUM CHLORIDE 0.9% FLUSH: 3.0000 mL | Freq: Once | INTRAVENOUS | Status: DC | PRN

## 2021-10-27 NOTE — Patient Instructions (Signed)

## 2021-10-27 NOTE — Progress Notes (Signed)
Pt states prior to leaving she is unable to stay x30 minute observation. Pt advised she had swelling to both hands after iron infusion ( last visit) later that night. ?Pt elevated her hands and applied ice. Pt reported swelling subsided. ?Instructed pt to take a benadryl or claritin after getting home if any swelling is suspected and to call office with concerns. Understanding was verbalized. ?

## 2021-10-28 ENCOUNTER — Ambulatory Visit (INDEPENDENT_AMBULATORY_CARE_PROVIDER_SITE_OTHER): Payer: 59 | Admitting: Bariatrics

## 2021-10-28 ENCOUNTER — Other Ambulatory Visit (HOSPITAL_BASED_OUTPATIENT_CLINIC_OR_DEPARTMENT_OTHER): Payer: Self-pay

## 2021-10-28 ENCOUNTER — Encounter (INDEPENDENT_AMBULATORY_CARE_PROVIDER_SITE_OTHER): Payer: Self-pay | Admitting: Bariatrics

## 2021-10-28 VITALS — BP 111/75 | HR 84 | Temp 98.0°F | Ht 61.0 in | Wt 179.0 lb

## 2021-10-28 DIAGNOSIS — E669 Obesity, unspecified: Secondary | ICD-10-CM

## 2021-10-28 DIAGNOSIS — R632 Polyphagia: Secondary | ICD-10-CM

## 2021-10-28 DIAGNOSIS — Z6833 Body mass index (BMI) 33.0-33.9, adult: Secondary | ICD-10-CM

## 2021-10-28 DIAGNOSIS — D508 Other iron deficiency anemias: Secondary | ICD-10-CM

## 2021-10-28 DIAGNOSIS — E7800 Pure hypercholesterolemia, unspecified: Secondary | ICD-10-CM

## 2021-10-28 DIAGNOSIS — E6609 Other obesity due to excess calories: Secondary | ICD-10-CM

## 2021-10-28 MED ORDER — TIRZEPATIDE 10 MG/0.5ML ~~LOC~~ SOAJ
10.0000 mg | SUBCUTANEOUS | 0 refills | Status: DC
  Filled 2021-10-28: qty 2, 28d supply, fill #0

## 2021-10-29 NOTE — Progress Notes (Signed)
? ? ? ?Chief Complaint:  ? ?OBESITY ?Shelley Ramirez is here to discuss her progress with her obesity treatment plan along with follow-up of her obesity related diagnoses. Shelley Ramirez is on the Category 1 Plan and states she is following her eating plan approximately 90% of the time. Shelley Ramirez states she is doing 0 minutes 0 times per week. ? ?Today's visit was #: 13 ?Starting weight: 205 lbs ?Starting date: 03/25/2021 ?Today's weight: 179 lbs ?Today's date: 10/28/2021 ?Total lbs lost to date: 26 lbs ?Total lbs lost since last in-office visit: 3 lbs ? ?Interim History: Shelley Ramirez is down 3 lbs since her last visit.  ? ?Subjective:  ? ?1. Polyphagia ?Shelley Ramirez is taking her medication as directed. She notes increased appetite slightly. She denies side effects.  ? ?2. Elevated cholesterol ?Shelley Ramirez is not on medications currently.  ? ?3. Other iron deficiency anemia ?Shelley Ramirez had iron infusion two times and she will have 1 next week.  ? ?Assessment/Plan:  ? ?1. Polyphagia ?Intensive lifestyle modifications are the first line treatment for this issue. We will refill Mounjaro 10 mg for 1 month with no refills. We will refill Zofran 4 mg for nausea as needed. We discussed several lifestyle modifications today and she will continue to work on diet, exercise and weight loss efforts. Orders and follow up as documented in patient record. ? ?Counseling ?Polyphagia is excessive hunger. ?Causes can include: low blood sugars, hypERthyroidism, PMS, lack of sleep, stress, insulin resistance, diabetes, certain medications, and diets that are deficient in protein and fiber.   ? ?- tirzepatide (MOUNJARO) 10 MG/0.5ML Pen; Inject 10 mg into the skin once a week.  Dispense: 2 mL; Refill: 0 ? ?2. Elevated cholesterol ?Cardiovascular risk and specific lipid/LDL goals reviewed.  Shelley Ramirez will have no trans fats. She will keep saturated fats low. We discussed several lifestyle modifications today and Shelley Ramirez will continue to work on diet, exercise and weight loss efforts. Orders and  follow up as documented in patient record.  ? ?Counseling ?Intensive lifestyle modifications are the first line treatment for this issue. ?Dietary changes: Increase soluble fiber. Decrease simple carbohydrates. ?Exercise changes: Moderate to vigorous-intensity aerobic activity 150 minutes per week if tolerated. ?Lipid-lowering medications: see documented in medical record. ? ?3. Other iron deficiency anemia ?Orders and follow up as documented in patient record. Shelley Ramirez will continue iron infusions.  ? ?Counseling ?Iron is essential for our bodies to make red blood cells.  Reasons that someone may be deficient include: an iron-deficient diet (more likely in those following vegan or vegetarian diets), women with heavy menses, patients with GI disorders or poor absorption, patients that have had bariatric surgery, frequent blood donors, patients with cancer, and patients with heart disease.   ?An iron supplement has been recommended. This is found over-the-counter.  ?Iron-rich foods include dark leafy greens, red and white meats, eggs, seafood, and beans.   ?Certain foods and drinks prevent your body from absorbing iron properly. Avoid eating these foods in the same meal as iron-rich foods or with iron supplements. These foods include: coffee, black tea, and red wine; milk, dairy products, and foods that are high in calcium; beans and soybeans; whole grains.  ?Constipation can be a side effect of iron supplementation. Increased water and fiber intake are helpful. Water goal: > 2 liters/day. Fiber goal: > 25 grams/day.  ? ?4. Obesity, current BMI 33.8 ?Shelley Ramirez is currently in the action stage of change. As such, her goal is to continue with weight loss efforts. She has agreed to the  Category 1 Plan.  ? ?Shelley Ramirez will continue meal planning and she will continue to adhere closely to the plan 80-90%. ? ?Exercise goals:  As is.  ? ?Behavioral modification strategies: increasing lean protein intake, decreasing simple  carbohydrates, increasing vegetables, increasing water intake, decreasing eating out, no skipping meals, meal planning and cooking strategies, keeping healthy foods in the home, and planning for success. ? ?Shelley Ramirez has agreed to follow-up with our clinic in 3-4 weeks with nurse practitioner. She was informed of the importance of frequent follow-up visits to maximize her success with intensive lifestyle modifications for her multiple health conditions.  ? ?Objective:  ? ?Blood pressure 111/75, pulse 84, temperature 98 ?F (36.7 ?C), height 5\' 1"  (1.549 m), weight 179 lb (81.2 kg), SpO2 98 %, unknown if currently breastfeeding. ?Body mass index is 33.82 kg/m?. ? ?General: Cooperative, alert, well developed, in no acute distress. ?HEENT: Conjunctivae and lids unremarkable. ?Cardiovascular: Regular rhythm.  ?Lungs: Normal work of breathing. ?Neurologic: No focal deficits.  ? ?Lab Results  ?Component Value Date  ? CREATININE 0.86 10/07/2021  ? BUN 8 10/07/2021  ? NA 139 10/07/2021  ? K 4.1 10/07/2021  ? CL 103 10/07/2021  ? CO2 23 10/07/2021  ? ?Lab Results  ?Component Value Date  ? ALT 11 10/07/2021  ? AST 15 10/07/2021  ? ALKPHOS 73 10/07/2021  ? BILITOT 0.3 10/07/2021  ? ?Lab Results  ?Component Value Date  ? HGBA1C 5.2 10/07/2021  ? HGBA1C 5.5 03/25/2021  ? ?Lab Results  ?Component Value Date  ? INSULIN 10.2 10/07/2021  ? INSULIN 8.5 03/25/2021  ? ?No results found for: TSH ?Lab Results  ?Component Value Date  ? CHOL 188 10/07/2021  ? HDL 45 10/07/2021  ? LDLCALC 119 (H) 10/07/2021  ? TRIG 136 10/07/2021  ? ?Lab Results  ?Component Value Date  ? VD25OH 15.0 (L) 10/07/2021  ? VD25OH 10.5 (L) 03/25/2021  ? ?Lab Results  ?Component Value Date  ? WBC 6.1 10/13/2021  ? HGB 9.6 (L) 10/13/2021  ? HCT 30.7 (L) 10/13/2021  ? MCV 78.5 (L) 10/13/2021  ? PLT 519 (H) 10/13/2021  ? ?Lab Results  ?Component Value Date  ? IRON 22 (L) 10/07/2021  ? TIBC 415 10/07/2021  ? FERRITIN 9 (L) 10/07/2021  ? ?Attestation Statements:  ? ?Reviewed  by clinician on day of visit: allergies, medications, problem list, medical history, surgical history, family history, social history, and previous encounter notes. ? ?I, 10/09/2021, RMA, am acting as transcriptionist for Jackson Latino, DO. ? ?I have reviewed the above documentation for accuracy and completeness, and I agree with the above. [employer], DO ? ?

## 2021-10-30 ENCOUNTER — Other Ambulatory Visit (INDEPENDENT_AMBULATORY_CARE_PROVIDER_SITE_OTHER): Payer: Self-pay | Admitting: Bariatrics

## 2021-10-30 DIAGNOSIS — R632 Polyphagia: Secondary | ICD-10-CM

## 2021-11-01 ENCOUNTER — Other Ambulatory Visit (HOSPITAL_BASED_OUTPATIENT_CLINIC_OR_DEPARTMENT_OTHER): Payer: Self-pay

## 2021-11-01 ENCOUNTER — Other Ambulatory Visit: Payer: Self-pay | Admitting: Family

## 2021-11-01 ENCOUNTER — Inpatient Hospital Stay: Payer: 59

## 2021-11-01 VITALS — BP 104/76 | HR 85 | Temp 98.7°F | Resp 17

## 2021-11-01 DIAGNOSIS — N920 Excessive and frequent menstruation with regular cycle: Secondary | ICD-10-CM | POA: Diagnosis not present

## 2021-11-01 DIAGNOSIS — D500 Iron deficiency anemia secondary to blood loss (chronic): Secondary | ICD-10-CM | POA: Diagnosis not present

## 2021-11-01 DIAGNOSIS — D508 Other iron deficiency anemias: Secondary | ICD-10-CM

## 2021-11-01 MED ORDER — SODIUM CHLORIDE 0.9 % IV SOLN: Freq: Once | INTRAVENOUS | Status: AC

## 2021-11-01 MED ORDER — SODIUM CHLORIDE 0.9 % IV SOLN
300.0000 mg | Freq: Once | INTRAVENOUS | Status: AC
  Administered 2021-11-01: 300 mg via INTRAVENOUS
  Filled 2021-11-01: qty 300

## 2021-11-01 MED ORDER — DIPHENHYDRAMINE HCL 25 MG PO CAPS
50.0000 mg | ORAL_CAPSULE | Freq: Once | ORAL | Status: AC
  Administered 2021-11-01: 50 mg via ORAL
  Filled 2021-11-01: qty 2

## 2021-11-01 MED ORDER — ONDANSETRON 4 MG PO TBDP
4.0000 mg | ORAL_TABLET | Freq: Three times a day (TID) | ORAL | 0 refills | Status: DC | PRN
  Filled 2021-11-01: qty 18, 6d supply, fill #0

## 2021-11-01 NOTE — Patient Instructions (Signed)

## 2021-11-01 NOTE — Progress Notes (Signed)
Pt receiving fluids post Venofer infusion and pt noted to have pockets of swelling on bilateral hands. Pockets are soft and non tender. Pt states that with last 2 infusions she had bilateral hand swelling and she said her left hand is hard to  close due to swelling. Lottie Dawson, NP aware and at bedside to evaluate pt. Pt to receive benadryl pre med with any further iron infusions.  ?

## 2021-11-02 ENCOUNTER — Encounter (INDEPENDENT_AMBULATORY_CARE_PROVIDER_SITE_OTHER): Payer: Self-pay | Admitting: Bariatrics

## 2021-11-02 ENCOUNTER — Inpatient Hospital Stay: Payer: 59

## 2021-11-02 ENCOUNTER — Ambulatory Visit: Payer: 59

## 2021-11-16 ENCOUNTER — Telehealth: Payer: Self-pay | Admitting: *Deleted

## 2021-11-16 NOTE — Telephone Encounter (Signed)
Called patient to reschedule appointment with Judson Roch - unable to lvm - mailbox is full  - sent a sms notification with my phone number to call back- called husband - no answer, keeps ringing - will mail new calendar with updated appointment.  ?

## 2021-11-24 ENCOUNTER — Other Ambulatory Visit: Payer: 59

## 2021-11-24 ENCOUNTER — Ambulatory Visit: Payer: 59 | Admitting: Family

## 2021-11-24 ENCOUNTER — Other Ambulatory Visit (INDEPENDENT_AMBULATORY_CARE_PROVIDER_SITE_OTHER): Payer: Self-pay | Admitting: Bariatrics

## 2021-11-24 DIAGNOSIS — R632 Polyphagia: Secondary | ICD-10-CM

## 2021-11-24 NOTE — Telephone Encounter (Signed)
Patient was last seen by Dr. Manson Passey.  ?

## 2021-11-25 ENCOUNTER — Ambulatory Visit (INDEPENDENT_AMBULATORY_CARE_PROVIDER_SITE_OTHER): Payer: 59 | Admitting: Adult Health

## 2021-11-25 ENCOUNTER — Other Ambulatory Visit (HOSPITAL_BASED_OUTPATIENT_CLINIC_OR_DEPARTMENT_OTHER): Payer: Self-pay

## 2021-11-25 ENCOUNTER — Encounter: Payer: Self-pay | Admitting: Family

## 2021-11-25 ENCOUNTER — Encounter (INDEPENDENT_AMBULATORY_CARE_PROVIDER_SITE_OTHER): Payer: Self-pay | Admitting: Adult Health

## 2021-11-25 VITALS — BP 115/77 | HR 81 | Temp 97.6°F | Ht 61.0 in | Wt 171.0 lb

## 2021-11-25 DIAGNOSIS — E669 Obesity, unspecified: Secondary | ICD-10-CM

## 2021-11-25 DIAGNOSIS — R632 Polyphagia: Secondary | ICD-10-CM | POA: Diagnosis not present

## 2021-11-25 DIAGNOSIS — Z9189 Other specified personal risk factors, not elsewhere classified: Secondary | ICD-10-CM

## 2021-11-25 DIAGNOSIS — E559 Vitamin D deficiency, unspecified: Secondary | ICD-10-CM

## 2021-11-25 DIAGNOSIS — Z6832 Body mass index (BMI) 32.0-32.9, adult: Secondary | ICD-10-CM | POA: Diagnosis not present

## 2021-11-25 DIAGNOSIS — R110 Nausea: Secondary | ICD-10-CM

## 2021-11-25 DIAGNOSIS — E6609 Other obesity due to excess calories: Secondary | ICD-10-CM

## 2021-11-25 MED ORDER — ONDANSETRON 4 MG PO TBDP
4.0000 mg | ORAL_TABLET | Freq: Three times a day (TID) | ORAL | 0 refills | Status: DC | PRN
  Filled 2021-11-25: qty 20, 7d supply, fill #0

## 2021-11-25 MED ORDER — VITAMIN D (ERGOCALCIFEROL) 1.25 MG (50000 UNIT) PO CAPS
ORAL_CAPSULE | ORAL | 0 refills | Status: DC
  Filled 2021-11-25: qty 8, 24d supply, fill #0

## 2021-11-25 MED ORDER — TIRZEPATIDE 10 MG/0.5ML ~~LOC~~ SOAJ
10.0000 mg | SUBCUTANEOUS | 0 refills | Status: DC
  Filled 2021-11-25: qty 2, 28d supply, fill #0

## 2021-11-26 ENCOUNTER — Other Ambulatory Visit (HOSPITAL_BASED_OUTPATIENT_CLINIC_OR_DEPARTMENT_OTHER): Payer: Self-pay

## 2021-11-29 ENCOUNTER — Ambulatory Visit (INDEPENDENT_AMBULATORY_CARE_PROVIDER_SITE_OTHER): Payer: 59 | Admitting: Nurse Practitioner

## 2021-11-29 ENCOUNTER — Other Ambulatory Visit (HOSPITAL_BASED_OUTPATIENT_CLINIC_OR_DEPARTMENT_OTHER): Payer: Self-pay

## 2021-12-01 ENCOUNTER — Inpatient Hospital Stay: Payer: 59 | Attending: Hematology & Oncology

## 2021-12-01 ENCOUNTER — Inpatient Hospital Stay: Payer: 59 | Admitting: Family

## 2021-12-05 NOTE — Progress Notes (Signed)
? ? ? ?Chief Complaint:  ? ?OBESITY ?Shelley Ramirez is here to discuss her progress with her obesity treatment plan along with follow-up of her obesity related diagnoses. Shelley Ramirez is on the Category 1 Plan and states she is following her eating plan approximately 85% of the time. Shelley Ramirez states she is not exercising ? ?Today's visit was #: 14 ?Starting weight: 205 ?Starting date: 03/25/2021 ?Today's weight: 171 ?Today's date: 11/25/2021 ?Total lbs lost to date: 69 ?Total lbs lost since last in-office visit: 8 ? ?Interim History:  ?Shelley Ramirez has been unable to attend Zumba class due to increased work hours.   ?She is active every day-NEAT activities. ? ?Subjective:  ? ?1. Polyphagia ?Shelley Ramirez started on Mounjaro 2.5 mg on 05/25/21 and titrated up to Mounjaro 10 mg. ?She denies mass in neck, dysphagia, dyspepsia, persistent hoarseness, abd pain, or constipation. ? ?2. Nausea ?Food triggers, chicken depending on preparation, shellfish and [place].   ?She estimates use of Zofran 4 mg as 2-3 times per month ? ?3. Vitamin D deficiency ?10/07/21 Vita min D level 15.0-severely subtherapeutic.   ?She is on Ergocalciferol 1 capsule every 10 days.  ? ?5. At risk for osteoporosis ?Shelley Ramirez is at higher risk of osteopenia and osteoporosis due to Vitamin D deficiency.  ? ?Assessment/Plan:  ? ?1. Polyphagia ?Shelley Ramirez will continue Mounjaro and refill is provided.  ? ?- tirzepatide (MOUNJARO) 10 MG/0.5ML Pen; Inject 10 mg into the skin once a week.  Dispense: 2 mL; Refill: 0 ?- ondansetron (ZOFRAN-ODT) 4 MG disintegrating tablet; Take 1 tablet (4 mg total) by mouth every 8 (eight) hours as needed for nausea or vomiting.  Dispense: 20 tablet; Refill: 0 ? ?2. Nausea ?Will refill Zofran 4 mg. ?RF Ondansetron 4mg  1 tab Q8H PRN nausea Disp 20 tabs RF 0 ? ?3. Vitamin D deficiency ?RF and increase Ergocalciferol ? ?RF Ergocalciferol 50,000 IU 1 cap Q3Dat Disp 8 caps RF 0 ?Check labs Q2-3 months ? ?4. At risk for osteoporosis ?At risk due to severe Vitamin D deficiency  and obesity. ? ?5. Obesity, current BMI 32.3 ?Shelley Ramirez is currently in the action stage of change. As such, her goal is to maintain weight for now. She has agreed to the Category 1 Plan.  ? ?Exercise goals: As is ? ?Behavioral modification strategies: increasing lean protein intake, decreasing simple carbohydrates, meal planning and cooking strategies, keeping healthy foods in the home, and planning for success. ? ?Shelley Ramirez has agreed to follow-up with our clinic in 3 weeks. She was informed of the importance of frequent follow-up visits to maximize her success with intensive lifestyle modifications for her multiple health conditions.  ? ? ?Objective:  ? ?Blood pressure 115/77, pulse 81, temperature 97.6 ?F (36.4 ?C), height 5\' 1"  (1.549 m), weight 171 lb (77.6 kg), SpO2 100 %, unknown if currently breastfeeding. ?Body mass index is 32.31 kg/m?. ? ?General: Cooperative, alert, well developed, in no acute distress. ?HEENT: Conjunctivae and lids unremarkable. ?Cardiovascular: Regular rhythm.  ?Lungs: Normal work of breathing. ?Neurologic: No focal deficits.  ? ?Lab Results  ?Component Value Date  ? CREATININE 0.86 10/07/2021  ? BUN 8 10/07/2021  ? NA 139 10/07/2021  ? K 4.1 10/07/2021  ? CL 103 10/07/2021  ? CO2 23 10/07/2021  ? ?Lab Results  ?Component Value Date  ? ALT 11 10/07/2021  ? AST 15 10/07/2021  ? ALKPHOS 73 10/07/2021  ? BILITOT 0.3 10/07/2021  ? ?Lab Results  ?Component Value Date  ? HGBA1C 5.2 10/07/2021  ? HGBA1C 5.5 03/25/2021  ? ?  Lab Results  ?Component Value Date  ? INSULIN 10.2 10/07/2021  ? INSULIN 8.5 03/25/2021  ? ?No results found for: TSH ?Lab Results  ?Component Value Date  ? CHOL 188 10/07/2021  ? HDL 45 10/07/2021  ? LDLCALC 119 (H) 10/07/2021  ? TRIG 136 10/07/2021  ? ?Lab Results  ?Component Value Date  ? VD25OH 15.0 (L) 10/07/2021  ? VD25OH 10.5 (L) 03/25/2021  ? ?Lab Results  ?Component Value Date  ? WBC 6.1 10/13/2021  ? HGB 9.6 (L) 10/13/2021  ? HCT 30.7 (L) 10/13/2021  ? MCV 78.5 (L)  10/13/2021  ? PLT 519 (H) 10/13/2021  ? ?Lab Results  ?Component Value Date  ? IRON 22 (L) 10/07/2021  ? TIBC 415 10/07/2021  ? FERRITIN 9 (L) 10/07/2021  ? ? ? ?Attestation Statements:  ? ?Reviewed by clinician on day of visit: allergies, medications, problem list, medical history, surgical history, family history, social history, and previous encounter notes. ? ?I, Janey Greaser, am acting as Energy manager for William Hamburger, NP. ? ?I have reviewed the above documentation for accuracy and completeness, and I agree with the above. -  Jenipher Havel d. Edrei Norgaard, NP-C ? ?

## 2021-12-07 DIAGNOSIS — R632 Polyphagia: Secondary | ICD-10-CM | POA: Insufficient documentation

## 2021-12-07 DIAGNOSIS — R110 Nausea: Secondary | ICD-10-CM | POA: Insufficient documentation

## 2021-12-14 ENCOUNTER — Encounter (INDEPENDENT_AMBULATORY_CARE_PROVIDER_SITE_OTHER): Payer: Self-pay | Admitting: Adult Health

## 2021-12-14 ENCOUNTER — Ambulatory Visit (INDEPENDENT_AMBULATORY_CARE_PROVIDER_SITE_OTHER): Payer: 59 | Admitting: Adult Health

## 2021-12-14 ENCOUNTER — Other Ambulatory Visit (HOSPITAL_BASED_OUTPATIENT_CLINIC_OR_DEPARTMENT_OTHER): Payer: Self-pay

## 2021-12-14 VITALS — BP 120/75 | HR 70 | Temp 97.9°F | Ht 61.0 in | Wt 170.0 lb

## 2021-12-14 DIAGNOSIS — E669 Obesity, unspecified: Secondary | ICD-10-CM | POA: Diagnosis not present

## 2021-12-14 DIAGNOSIS — R110 Nausea: Secondary | ICD-10-CM

## 2021-12-14 DIAGNOSIS — Z6832 Body mass index (BMI) 32.0-32.9, adult: Secondary | ICD-10-CM | POA: Diagnosis not present

## 2021-12-14 DIAGNOSIS — R632 Polyphagia: Secondary | ICD-10-CM

## 2021-12-14 DIAGNOSIS — E559 Vitamin D deficiency, unspecified: Secondary | ICD-10-CM | POA: Diagnosis not present

## 2021-12-14 DIAGNOSIS — Z9189 Other specified personal risk factors, not elsewhere classified: Secondary | ICD-10-CM

## 2021-12-14 DIAGNOSIS — E6609 Other obesity due to excess calories: Secondary | ICD-10-CM

## 2021-12-14 MED ORDER — TIRZEPATIDE 10 MG/0.5ML ~~LOC~~ SOAJ
10.0000 mg | SUBCUTANEOUS | 0 refills | Status: DC
  Filled 2021-12-14 – 2021-12-17 (×2): qty 2, 28d supply, fill #0

## 2021-12-14 MED ORDER — VITAMIN D (ERGOCALCIFEROL) 1.25 MG (50000 UNIT) PO CAPS
ORAL_CAPSULE | ORAL | 0 refills | Status: DC
  Filled 2021-12-14: qty 8, 24d supply, fill #0

## 2021-12-14 MED ORDER — ONDANSETRON 4 MG PO TBDP
4.0000 mg | ORAL_TABLET | Freq: Three times a day (TID) | ORAL | 0 refills | Status: DC | PRN
  Filled 2021-12-14: qty 18, 6d supply, fill #0

## 2021-12-15 NOTE — Progress Notes (Signed)
Chief Complaint:   OBESITY Shelley Ramirez is here to discuss her progress with her obesity treatment plan along with follow-up of her obesity related diagnoses. [name] is on the Category 1 Plan and states she is following her eating plan approximately 95% of the time. [name] states she is not currently exercising.  Today's visit was #: 15 Starting weight: 205 Starting date: 03/25/2021 Today's weight: 170 lbs Today's date: 12/14/21 Total lbs lost to date: 35 lbs Total lbs lost since last in-office visit: -1  Interim History:  She is still waiting to restart Zumba classes.  She was started on Moujaro therapy 05/25/2021- has titrated up to 10mg  once weekly- tolerating well.  Subjective:   1. Vitamin D deficiency She is on ergocalciferol twice weekly- denies N/V/Muscle weakness.  2. Polyphagia She injects Mounjaro 10 mg on Monday. She denies mass in neck, dysphagia, dyspepsia, persistent hoarseness, abd pain, or N/V/Constipation.  3.  Nausea without vomiting She estimates to experience nausea without vomiting 3 times per week.  4. At risk for constipation Secondary to [facility] therapy for polyphagia.  Assessment/Plan:   1. Vitamin D deficiency Refill: Ergocalciferol 50,000 international units once weekly, dispense 8 capsules, 0 refills.  2. Polyphagia Refill: - tirzepatide (MOUNJARO) 10 MG/0.5ML Pen; Inject 10 mg into the skin once a week.  Dispense: 2 mL; Refill: 0  3.  Nausea without vomiting Do not overeat. Refill: - Zofran 4 mg once every 8 hours as needed for nausea or vomiting, dispense 20 tablets, 0 refills.  4. At risk for constipation [name] was given approximately 15 minutes of counseling today regarding prevention of constipation. She was encouraged to increase water and fiber intake.    4. Obesity, current BMI 32.2 [name] is currently in the action stage of change. As such, her goal is to continue with weight loss efforts. She has agreed to the Category 1 Plan.    Finish Mounjaro 10 mg.  Backup Wegovy.  Exercise goals: All adults should avoid inactivity. Some physical activity is better than none, and adults who participate in any amount of physical activity gain some health benefits.  Behavioral modification strategies: increasing lean protein intake, decreasing simple carbohydrates, meal planning and cooking strategies, keeping healthy foods in the home, and planning for success.  [name] has agreed to follow-up with our clinic in 2 weeks. She was informed of the importance of frequent follow-up visits to maximize her success with intensive lifestyle modifications for her multiple health conditions.   Objective:   Blood pressure 120/75, pulse 70, temperature 97.9 F (36.6 C), height 5\' 1"  (1.549 m), weight 170 lb (77.1 kg), SpO2 100 %, unknown if currently breastfeeding. Body mass index is 32.12 kg/m.  General: Cooperative, alert, well developed, in no acute distress. HEENT: Conjunctivae and lids unremarkable. Cardiovascular: Regular rhythm.  Lungs: Normal work of breathing. Neurologic: No focal deficits.   Lab Results  Component Value Date   CREATININE 0.86 10/07/2021   BUN 8 10/07/2021   NA 139 10/07/2021   K 4.1 10/07/2021   CL 103 10/07/2021   CO2 23 10/07/2021   Lab Results  Component Value Date   ALT 11 10/07/2021   AST 15 10/07/2021   ALKPHOS 73 10/07/2021   BILITOT 0.3 10/07/2021   Lab Results  Component Value Date   HGBA1C 5.2 10/07/2021   HGBA1C 5.5 03/25/2021   Lab Results  Component Value Date   INSULIN 10.2 10/07/2021   INSULIN 8.5 03/25/2021   No results found for: TSH  Lab Results  Component Value Date   CHOL 188 10/07/2021   HDL 45 10/07/2021   LDLCALC 119 (H) 10/07/2021   TRIG 136 10/07/2021   Lab Results  Component Value Date   VD25OH 15.0 (L) 10/07/2021   VD25OH 10.5 (L) 03/25/2021   Lab Results  Component Value Date   WBC 6.1 10/13/2021   HGB 9.6 (L) 10/13/2021   HCT 30.7 (L) 10/13/2021    MCV 78.5 (L) 10/13/2021   PLT 519 (H) 10/13/2021   Lab Results  Component Value Date   IRON 22 (L) 10/07/2021   TIBC 415 10/07/2021   FERRITIN 9 (L) 10/07/2021     Attestation Statements:   Reviewed by clinician on day of visit: allergies, medications, problem list, medical history, surgical history, family history, social history, and previous encounter notes.  I, Jesse Sans, FNP, am acting as Energy manager for William Hamburger, NP.  I have reviewed the above documentation for accuracy and completeness, and I agree with the above. -  Demetris Meinhardt d. Meka Lewan, NP-C

## 2021-12-17 ENCOUNTER — Other Ambulatory Visit (HOSPITAL_BASED_OUTPATIENT_CLINIC_OR_DEPARTMENT_OTHER): Payer: Self-pay

## 2021-12-22 ENCOUNTER — Other Ambulatory Visit (HOSPITAL_BASED_OUTPATIENT_CLINIC_OR_DEPARTMENT_OTHER): Payer: Self-pay

## 2021-12-27 ENCOUNTER — Ambulatory Visit (INDEPENDENT_AMBULATORY_CARE_PROVIDER_SITE_OTHER): Payer: 59 | Admitting: Bariatrics

## 2022-01-04 ENCOUNTER — Ambulatory Visit (INDEPENDENT_AMBULATORY_CARE_PROVIDER_SITE_OTHER): Payer: 59 | Admitting: Adult Health

## 2022-01-04 ENCOUNTER — Encounter (INDEPENDENT_AMBULATORY_CARE_PROVIDER_SITE_OTHER): Payer: Self-pay | Admitting: Adult Health

## 2022-01-04 VITALS — BP 93/65 | HR 107 | Temp 98.0°F | Ht 61.0 in | Wt 170.0 lb

## 2022-01-04 DIAGNOSIS — Z6832 Body mass index (BMI) 32.0-32.9, adult: Secondary | ICD-10-CM | POA: Diagnosis not present

## 2022-01-04 DIAGNOSIS — E669 Obesity, unspecified: Secondary | ICD-10-CM | POA: Diagnosis not present

## 2022-01-04 DIAGNOSIS — R632 Polyphagia: Secondary | ICD-10-CM

## 2022-01-04 DIAGNOSIS — Z7985 Long-term (current) use of injectable non-insulin antidiabetic drugs: Secondary | ICD-10-CM

## 2022-01-04 DIAGNOSIS — E6609 Other obesity due to excess calories: Secondary | ICD-10-CM

## 2022-01-05 NOTE — Progress Notes (Signed)
Chief Complaint:   OBESITY Shelley Ramirez is here to discuss her progress with her obesity treatment plan along with follow-up of her obesity related diagnoses. Shelley Ramirez is on the Category 1 Plan and states she is following her eating plan approximately 40% of the time. Shelley Ramirez states she is doing 0 minutes 0 times per week.  Today's visit was #: 58 Starting weight: 205 lbs Starting date: 03/25/2021 Today's weight: 170 lbs Today's date: 01/04/2022 Total lbs lost to date: 35 Total lbs lost since last in-office visit: 0  Interim History:  Shelley Ramirez notes last week compliance on category 1 meal plan 40% of the time.   She endorses increased stress at work while going through the "Recertification Process" for her substance abuse facility. The facility earned accreditation for the next 3 years.   She provided the following food recall that is "typical" for a day. Breakfast: [employer] refresher.   Lunch: turkey/chicken sub sandwich or [employer] pizza or [place] food.   Dinner:leftovers from eating out.  Subjective:   1. Polyphagia Mounjaro 5 mg was started on an 06/2021.   It titrated up to Mounjaro 10 mg.   Side effects of intermittent nausea without vomiting, estimates 7 times per month.  Assessment/Plan:   1. Polyphagia Shelley Ramirez will continue Mounjaro 10 mg, no refill needed. Do overeat.  2. Obesity, current BMI 32.2 Shelley Ramirez is currently in the action stage of change. As such, her goal is to continue with weight loss efforts. She has agreed to the Category 1 Plan.   Resume category 1 once life settles down.  Resume Zumba.  Exercise goals: All adults should avoid inactivity. Some physical activity is better than none, and adults who participate in any amount of physical activity gain some health benefits.  Behavioral modification strategies: increasing lean protein intake, decreasing simple carbohydrates, meal planning and cooking strategies, keeping healthy foods in the home, and planning for  success.  Shelley Ramirez has agreed to follow-up with our clinic in 4 weeks. She was informed of the importance of frequent follow-up visits to maximize her success with intensive lifestyle modifications for her multiple health conditions.   Objective:   Blood pressure 93/65, pulse (!) 107, temperature 98 F (36.7 C), height 5\' 1"  (1.549 m), weight 170 lb (77.1 kg), SpO2 99 %, unknown if currently breastfeeding. Body mass index is 32.12 kg/m.  General: Cooperative, alert, well developed, in no acute distress. HEENT: Conjunctivae and lids unremarkable. Cardiovascular: Regular rhythm.  Lungs: Normal work of breathing. Neurologic: No focal deficits.   Lab Results  Component Value Date   CREATININE 0.86 10/07/2021   BUN 8 10/07/2021   NA 139 10/07/2021   K 4.1 10/07/2021   CL 103 10/07/2021   CO2 23 10/07/2021   Lab Results  Component Value Date   ALT 11 10/07/2021   AST 15 10/07/2021   ALKPHOS 73 10/07/2021   BILITOT 0.3 10/07/2021   Lab Results  Component Value Date   HGBA1C 5.2 10/07/2021   HGBA1C 5.5 03/25/2021   Lab Results  Component Value Date   INSULIN 10.2 10/07/2021   INSULIN 8.5 03/25/2021   No results found for: "TSH" Lab Results  Component Value Date   CHOL 188 10/07/2021   HDL 45 10/07/2021   LDLCALC 119 (H) 10/07/2021   TRIG 136 10/07/2021   Lab Results  Component Value Date   VD25OH 15.0 (L) 10/07/2021   VD25OH 10.5 (L) 03/25/2021   Lab Results  Component Value Date   WBC 6.1  10/13/2021   HGB 9.6 (L) 10/13/2021   HCT 30.7 (L) 10/13/2021   MCV 78.5 (L) 10/13/2021   PLT 519 (H) 10/13/2021   Lab Results  Component Value Date   IRON 22 (L) 10/07/2021   TIBC 415 10/07/2021   FERRITIN 9 (L) 10/07/2021   Attestation Statements:   Reviewed by clinician on day of visit: allergies, medications, problem list, medical history, surgical history, family history, social history, and previous encounter notes.  Time spent on visit including pre-visit chart  review and post-visit care and charting was 28 minutes.   Wilhemena Durie, am acting as transcriptionist for Mina Marble, NP.  I have reviewed the above documentation for accuracy and completeness, and I agree with the above. -  Nisa Decaire d. Asher Babilonia, NP-C

## 2022-01-18 ENCOUNTER — Encounter (INDEPENDENT_AMBULATORY_CARE_PROVIDER_SITE_OTHER): Payer: Self-pay | Admitting: Bariatrics

## 2022-01-18 ENCOUNTER — Other Ambulatory Visit (HOSPITAL_BASED_OUTPATIENT_CLINIC_OR_DEPARTMENT_OTHER): Payer: Self-pay

## 2022-01-18 ENCOUNTER — Ambulatory Visit (INDEPENDENT_AMBULATORY_CARE_PROVIDER_SITE_OTHER): Payer: 59 | Admitting: Bariatrics

## 2022-01-18 VITALS — BP 99/68 | HR 85 | Temp 97.8°F | Ht 61.0 in | Wt 167.0 lb

## 2022-01-18 DIAGNOSIS — E559 Vitamin D deficiency, unspecified: Secondary | ICD-10-CM

## 2022-01-18 DIAGNOSIS — E66812 Obesity, class 2: Secondary | ICD-10-CM

## 2022-01-18 DIAGNOSIS — R110 Nausea: Secondary | ICD-10-CM | POA: Diagnosis not present

## 2022-01-18 DIAGNOSIS — R632 Polyphagia: Secondary | ICD-10-CM | POA: Diagnosis not present

## 2022-01-18 DIAGNOSIS — Z6831 Body mass index (BMI) 31.0-31.9, adult: Secondary | ICD-10-CM

## 2022-01-18 DIAGNOSIS — E669 Obesity, unspecified: Secondary | ICD-10-CM

## 2022-01-18 DIAGNOSIS — E6609 Other obesity due to excess calories: Secondary | ICD-10-CM

## 2022-01-18 DIAGNOSIS — Z7985 Long-term (current) use of injectable non-insulin antidiabetic drugs: Secondary | ICD-10-CM | POA: Diagnosis not present

## 2022-01-18 MED ORDER — VITAMIN D (ERGOCALCIFEROL) 1.25 MG (50000 UNIT) PO CAPS
ORAL_CAPSULE | ORAL | 0 refills | Status: DC
  Filled 2022-01-18: qty 8, 24d supply, fill #0

## 2022-01-18 MED ORDER — TIRZEPATIDE 10 MG/0.5ML ~~LOC~~ SOAJ
10.0000 mg | SUBCUTANEOUS | 0 refills | Status: DC
  Filled 2022-01-18: qty 2, 28d supply, fill #0

## 2022-01-18 MED ORDER — ONDANSETRON 4 MG PO TBDP
4.0000 mg | ORAL_TABLET | Freq: Three times a day (TID) | ORAL | 0 refills | Status: DC | PRN
  Filled 2022-01-18: qty 18, 6d supply, fill #0

## 2022-01-19 NOTE — Progress Notes (Unsigned)
Chief Complaint:   OBESITY Shelley Ramirez is here to discuss her progress with her obesity treatment plan along with follow-up of her obesity related diagnoses. Shelley Ramirez is on the Category 1 Plan and states she is following her eating plan approximately 85% of the time. Shelley Ramirez states she is doing 0 minutes 0 times per week.  Today's visit was #: 17 Starting weight: 205 lbs Starting date: 03/25/2021 Today's weight: 167 lbs Today's date: 01/18/2022 Total lbs lost to date: 38 Total lbs lost since last in-office visit: 3  Interim History: Shelley Ramirez is down another 3 lbs since her last visit.  Subjective:   1. Polyphagia Shelley Ramirez is taking Mounjaro.  2. Nausea without vomiting Shelley Ramirez notes nausea is improving.  3. Vitamin D deficiency Shelley Ramirez notes her energy is good.   Assessment/Plan:   1. Polyphagia We will refill Mounjaro 10 mg once weekly for 1 month.  - tirzepatide (MOUNJARO) 10 MG/0.5ML Pen; Inject 10 mg into the skin once a week.  Dispense: 2 mL; Refill: 0  2. Nausea without vomiting We will refill Zofran 4 mg every 8 hours as needed for nausea, for 1 month.  - ondansetron (ZOFRAN-ODT) 4 MG disintegrating tablet; Take 1 tablet (4 mg total) by mouth every 8 (eight) hours as needed for nausea or vomiting.  Dispense: 20 tablet; Refill: 0  3. Vitamin D deficiency We will refill prescription vitamin D 50,000 units every 3 days for 1 month.  - Vitamin D, Ergocalciferol, (DRISDOL) 1.25 MG (50000 UNIT) CAPS capsule; Take 1 capsule by mouth every 3 days  Dispense: 8 capsule; Refill: 0  4. Obesity, current BMI 31.7 Shelley Ramirez is currently in the action stage of change. As such, her goal is to continue with weight loss efforts. She has agreed to the Category 1 Plan.   Meal planning and intentional eating were discussed.  Exercise goals: Will do Zumba.  Behavioral modification strategies: increasing lean protein intake, decreasing simple carbohydrates, increasing vegetables, increasing water  intake, decreasing eating out, no skipping meals, meal planning and cooking strategies, keeping healthy foods in the home, and planning for success.  Shelley Ramirez has agreed to follow-up with our clinic in 4 weeks. She was informed of the importance of frequent follow-up visits to maximize her success with intensive lifestyle modifications for her multiple health conditions.   Objective:   Blood pressure 99/68, pulse 85, temperature 97.8 F (36.6 C), height 5\' 1"  (1.549 m), weight 167 lb (75.8 kg), SpO2 99 %, unknown if currently breastfeeding. Body mass index is 31.55 kg/m.  General: Cooperative, alert, well developed, in no acute distress. HEENT: Conjunctivae and lids unremarkable. Cardiovascular: Regular rhythm.  Lungs: Normal work of breathing. Neurologic: No focal deficits.   Lab Results  Component Value Date   CREATININE 0.86 10/07/2021   BUN 8 10/07/2021   NA 139 10/07/2021   K 4.1 10/07/2021   CL 103 10/07/2021   CO2 23 10/07/2021   Lab Results  Component Value Date   ALT 11 10/07/2021   AST 15 10/07/2021   ALKPHOS 73 10/07/2021   BILITOT 0.3 10/07/2021   Lab Results  Component Value Date   HGBA1C 5.2 10/07/2021   HGBA1C 5.5 03/25/2021   Lab Results  Component Value Date   INSULIN 10.2 10/07/2021   INSULIN 8.5 03/25/2021   No results found for: "TSH" Lab Results  Component Value Date   CHOL 188 10/07/2021   HDL 45 10/07/2021   LDLCALC 119 (H) 10/07/2021   TRIG 136 10/07/2021  Lab Results  Component Value Date   VD25OH 15.0 (L) 10/07/2021   VD25OH 10.5 (L) 03/25/2021   Lab Results  Component Value Date   WBC 6.1 10/13/2021   HGB 9.6 (L) 10/13/2021   HCT 30.7 (L) 10/13/2021   MCV 78.5 (L) 10/13/2021   PLT 519 (H) 10/13/2021   Lab Results  Component Value Date   IRON 22 (L) 10/07/2021   TIBC 415 10/07/2021   FERRITIN 9 (L) 10/07/2021   Attestation Statements:   Reviewed by clinician on day of visit: allergies, medications, problem list, medical  history, surgical history, family history, social history, and previous encounter notes.   Trude Mcburney, am acting as Energy manager for [employer], DO.  I have reviewed the above documentation for accuracy and completeness, and I agree with the above. Corinna Capra, DO

## 2022-01-20 ENCOUNTER — Encounter (INDEPENDENT_AMBULATORY_CARE_PROVIDER_SITE_OTHER): Payer: Self-pay | Admitting: Bariatrics

## 2022-02-02 NOTE — Progress Notes (Unsigned)
NEUROLOGY CONSULTATION NOTE  Shelley Ramirez MRN: [id number] DOB: February 02, 1981  Referring provider: Beverley Fiedler, MD Primary care provider: Beverley Fiedler, MD  Reason for consult:  migraines  Assessment/Plan:   Migraine without aura, without status migrainosus, not intractable  Migraine prevention:  start Aimovig 140mg  every 28 days.  Also on amitriptyline to help sleep Stop Excedrin.  Migraine rescue:  sumatriptan 50mg  with Zofran. Limit use of pain relievers to no more than 2 days out of week to prevent risk of rebound or medication-overuse headache. Keep headache diary Discussed lifestyle modification:  increase water intake, caffeine cessation, no skipping meals Follow up 4 months.  Subjective:  Shelley Ramirez is a 41 year old female who presents for migraines.  History supplemented by referring provider's note.  Accompanied by her husband who supplements history.  Onset:  65-58 years old Location:  right sided - forehead/retro-orbital Quality:  pressure Intensity:  5-6/10, 8-10/10 beginning 2 weeks before period Aura:  absent Prodrome:  absent Associated symptoms:  Nausea, photophobia. Phonophobia, osmophobia, sometimes blurred vision if severe. She denies associated unilateral numbness or weakness. Duration: 1-2 days, but 30 minutes with sumatriptan but more recently lasting longer. Often will wait an hour before taking it. Frequency:  Used to occur randomly.  For past 2 years, there is a menstrual component.  Total 20 days a month.   Triggers:  strong smells (perfumes), bright lights/sunlight, heat, loud noise, menstrual cycle Relieving factors:  Pregnancy (miscarriage) Activity:  aggravates  Takes Excedrin or sumatriptan 5 days a week (will take sumatriptan over 20 days a month). Current NSAIDS/analgesics:  Excedrin Current triptans:  sumatriptan 50mg  Current ergotamine:  none Current anti-emetic:  Zofran ODT 4mg  Current muscle relaxants:   none Current Antihypertensive medications:  none Current Antidepressant medications:  amitriptyline 25mg  QHS Current Anticonvulsant medications:  none Current anti-CGRP:  none Current Vitamins/Herbal/Supplements:  none Current Antihistamines/Decongestants:  none Other therapy:  none Hormone/birth control:  none   Past NSAIDS/analgesics:  Tylenol, ibuprofen, BC powder Past abortive triptans:  none Past abortive ergotamine:  none Past muscle relaxants:  none Past anti-emetic:  promethazine Past antihypertensive medications:  none Past antidepressant medications:  bupropion Past anticonvulsant medications:  topiramate Past anti-CGRP:  none Past vitamins/Herbal/Supplements:  D Past antihistamines/decongestants:  none Other past therapies:  none  Caffeine:  Grande size frapp every morning Diet:  Sprite daily.  No soda.  Skips meals Exercise:  no Depression:  yes; Anxiety:  no Sleep hygiene:  poor.  Taking amitriptyline which helps Family history of headache:  mother (migraines), brother and sister (migraines)      PAST MEDICAL HISTORY: Past Medical History:  Diagnosis Date   Acid reflux    Anemia    Lactose intolerance    Other fatigue    SOB (shortness of breath) on exertion    Vitamin D deficiency     PAST SURGICAL HISTORY: Past Surgical History:  Procedure Laterality Date   CESAREAN SECTION     14years ago    MEDICATIONS: Current Outpatient Medications on File Prior to Visit  Medication Sig Dispense Refill   amitriptyline (ELAVIL) 25 MG tablet Take 25 mg by mouth at bedtime as needed for sleep.     EPINEPHrine (EPIPEN 2-PAK) 0.3 mg/0.3 mL IJ SOAJ injection Inject 0.3 mLs (0.3 mg total) into the muscle as needed for anaphylaxis. Use in event of severe allergic reaction (throat closing, unable to breath, about to pass out or faint). 1 each 0   ondansetron (ZOFRAN-ODT) 4 MG  disintegrating tablet Take 1 tablet (4 mg total) by mouth every 8 (eight) hours as needed  for nausea or vomiting. 20 tablet 0   SUMAtriptan (IMITREX) 50 MG tablet Take 1 Tablet By Mouth 1-2 Times Daily 10 tablet 1   SUMAtriptan (IMITREX) 50 MG tablet Take 1 tablet by mouth at onset of migraine. May repeat in 2 hours if headache persists; limit 4 tablets in 24 hours by mouth. 30 tablet 1   tirzepatide (MOUNJARO) 10 MG/0.5ML Pen Inject 10 mg into the skin once a week. 2 mL 0   Vitamin D, Ergocalciferol, (DRISDOL) 1.25 MG (50000 UNIT) CAPS capsule Take 1 capsule by mouth every 3 days 8 capsule 0   No current facility-administered medications on file prior to visit.    ALLERGIES: Allergies  Allergen Reactions   Codeine Shortness Of Breath and Other (See Comments)    Hyperventilation, pass out, loss off consciousness     Clindamycin Nausea And Vomiting   Penicillin G Rash    FAMILY HISTORY: Family History  Problem Relation Age of Onset   Hypertension Mother    Kidney disease Mother    Sudden death Mother    Stroke Father    Hypertension Father    Alcoholism Father    Cancer Maternal Aunt    Diabetes Maternal Aunt     Objective:  Blood pressure 105/73, pulse 86, height 5' (1.524 m), weight 169 lb (76.7 kg), SpO2 100 %, unknown if currently breastfeeding. General: No acute distress.  Patient appears well-groomed.   Head:  Normocephalic/atraumatic Eyes:  fundi examined but not visualized Neck: supple, no paraspinal tenderness, full range of motion Heart: regular rate and rhythm Neurological Exam: Mental status: alert and oriented to person, place, and time, speech fluent and not dysarthric, language intact. Cranial nerves: CN I: not tested CN II: pupils equal, round and reactive to light, visual fields intact CN III, IV, VI:  full range of motion, no nystagmus, no ptosis CN V: facial sensation intact. CN VII: upper and lower face symmetric CN VIII: hearing intact CN IX, X: gag intact, uvula midline CN XI: sternocleidomastoid and trapezius muscles intact CN XII:  tongue midline Bulk & Tone: normal, no fasciculations. Motor:  muscle strength 5/5 throughout Sensation:  Pinprick, temperature and vibratory sensation intact. Deep Tendon Reflexes:  2+ throughout,  toes downgoing.   Finger to nose testing:  Without dysmetria.   Heel to shin:  Without dysmetria.   Gait:  Normal station and stride.  Romberg negative.    Thank you for allowing me to take part in the care of this patient.  Shon Millet, DO  CC: Beverley Fiedler, MD

## 2022-02-03 ENCOUNTER — Ambulatory Visit: Payer: 59 | Admitting: Neurology

## 2022-02-03 ENCOUNTER — Encounter: Payer: Self-pay | Admitting: Neurology

## 2022-02-03 ENCOUNTER — Other Ambulatory Visit (HOSPITAL_BASED_OUTPATIENT_CLINIC_OR_DEPARTMENT_OTHER): Payer: Self-pay

## 2022-02-03 ENCOUNTER — Telehealth (HOSPITAL_COMMUNITY): Payer: Self-pay | Admitting: Pharmacy Technician

## 2022-02-03 ENCOUNTER — Encounter: Payer: Self-pay | Admitting: Family

## 2022-02-03 VITALS — BP 105/73 | HR 86 | Ht 60.0 in | Wt 169.0 lb

## 2022-02-03 DIAGNOSIS — G43009 Migraine without aura, not intractable, without status migrainosus: Secondary | ICD-10-CM | POA: Diagnosis not present

## 2022-02-03 MED ORDER — AIMOVIG 140 MG/ML ~~LOC~~ SOAJ
140.0000 mg | SUBCUTANEOUS | 5 refills | Status: DC
  Filled 2022-02-03: qty 1, 28d supply, fill #0

## 2022-02-03 NOTE — Telephone Encounter (Signed)
Patient Advocate Encounter   Received notification that prior authorization for Aimovig 140MG /ML auto-injectors is required.   PA submitted on 02/03/2022 Key [id number] Status is pending       02/05/2022, CPhT Pharmacy Patient Advocate Specialist [facility] Patient Advocate Team Direct Number: [phone]  Fax: [phone]

## 2022-02-03 NOTE — Patient Instructions (Signed)
  Start Aimovig 140mg  injection every 28 days.   Stop Excedrin and all over the counter analgesics.  Take sumatriptan at earliest onset of headache.  May repeat dose once in 2 hours if needed.  Maximum 2 tablets in 24 hours. Limit use of pain relievers to no more than 2 days out of the week.  These medications include acetaminophen, NSAIDs (ibuprofen/Advil/Motrin, naproxen/Aleve, triptans (Imitrex/sumatriptan), Excedrin, and narcotics.  This will help reduce risk of rebound headaches. Be aware of common food triggers:  - Caffeine:  coffee, black tea, cola, Mt. Dew  - Chocolate  - Dairy:  aged cheeses (brie, blue, cheddar, gouda, [place], provolone, [place], Swiss, etc), chocolate milk, buttermilk, sour cream, limit eggs and yogurt  - Nuts, peanut butter  - Alcohol  - Cereals/grains:  FRESH breads (fresh bagels, sourdough, doughnuts), yeast productions  - Processed/canned/aged/cured meats (pre-packaged deli meats, hotdogs)  - MSG/glutamate:  soy sauce, flavor enhancer, pickled/preserved/marinated foods  - Sweeteners:  aspartame (Equal, Nutrasweet).  Sugar and Splenda are okay  - Vegetables:  legumes (lima beans, lentils, snow peas, fava beans, pinto peans, peas, garbanzo beans), sauerkraut, onions, olives, pickles  - Fruit:  avocados, bananas, citrus fruit (orange, lemon, grapefruit), mango  - Other:  Frozen meals, macaroni and cheese Routine exercise Stay adequately hydrated (aim for 64 oz water daily) Keep headache diary Maintain proper stress management Maintain proper sleep hygiene Do not skip meals Consider supplements:  magnesium citrate 400mg  daily, riboflavin 400mg  daily, coenzyme Q10 100mg  three times daily.

## 2022-02-04 ENCOUNTER — Encounter: Payer: Self-pay | Admitting: Family

## 2022-02-04 ENCOUNTER — Other Ambulatory Visit (HOSPITAL_BASED_OUTPATIENT_CLINIC_OR_DEPARTMENT_OTHER): Payer: Self-pay

## 2022-02-04 NOTE — Telephone Encounter (Signed)
Patient Advocate Encounter  Prior Authorization for Aimovig 140MG /ML auto-injectors has been approved.    PA# Effective dates: 02/04/2022 through 02/05/2023      02/07/2023, CPhT Pharmacy Patient Advocate Specialist [facility] Patient Advocate Team Direct Number: [phone]  Fax: [phone]

## 2022-02-15 ENCOUNTER — Ambulatory Visit (INDEPENDENT_AMBULATORY_CARE_PROVIDER_SITE_OTHER): Payer: 59 | Admitting: Bariatrics

## 2022-02-15 ENCOUNTER — Other Ambulatory Visit (HOSPITAL_BASED_OUTPATIENT_CLINIC_OR_DEPARTMENT_OTHER): Payer: Self-pay

## 2022-02-15 ENCOUNTER — Encounter (INDEPENDENT_AMBULATORY_CARE_PROVIDER_SITE_OTHER): Payer: Self-pay | Admitting: Bariatrics

## 2022-02-15 VITALS — BP 105/72 | HR 90 | Temp 98.4°F | Ht 61.0 in | Wt 166.0 lb

## 2022-02-15 DIAGNOSIS — R110 Nausea: Secondary | ICD-10-CM

## 2022-02-15 DIAGNOSIS — E8881 Metabolic syndrome: Secondary | ICD-10-CM | POA: Diagnosis not present

## 2022-02-15 DIAGNOSIS — R632 Polyphagia: Secondary | ICD-10-CM | POA: Diagnosis not present

## 2022-02-15 DIAGNOSIS — Z6831 Body mass index (BMI) 31.0-31.9, adult: Secondary | ICD-10-CM

## 2022-02-15 DIAGNOSIS — E6609 Other obesity due to excess calories: Secondary | ICD-10-CM

## 2022-02-15 DIAGNOSIS — E669 Obesity, unspecified: Secondary | ICD-10-CM

## 2022-02-15 MED ORDER — ONDANSETRON 4 MG PO TBDP
4.0000 mg | ORAL_TABLET | Freq: Three times a day (TID) | ORAL | 0 refills | Status: DC | PRN
  Filled 2022-02-15: qty 18, 21d supply, fill #0

## 2022-02-15 MED ORDER — TIRZEPATIDE 10 MG/0.5ML ~~LOC~~ SOAJ
10.0000 mg | SUBCUTANEOUS | 0 refills | Status: DC
Start: 2022-02-15 — End: 2022-03-15
  Filled 2022-02-15 – 2022-02-19 (×2): qty 2, 28d supply, fill #0

## 2022-02-16 ENCOUNTER — Encounter (INDEPENDENT_AMBULATORY_CARE_PROVIDER_SITE_OTHER): Payer: Self-pay

## 2022-02-16 ENCOUNTER — Telehealth (INDEPENDENT_AMBULATORY_CARE_PROVIDER_SITE_OTHER): Payer: Self-pay | Admitting: Bariatrics

## 2022-02-16 ENCOUNTER — Other Ambulatory Visit (HOSPITAL_BASED_OUTPATIENT_CLINIC_OR_DEPARTMENT_OTHER): Payer: Self-pay

## 2022-02-16 NOTE — Telephone Encounter (Signed)
Dr. Manson Passey - Prior authorization denied for [facility]. Patient does not have type 2 diabetes. Patient already uses copay card. Patient sent denial message via mychart.

## 2022-02-20 ENCOUNTER — Other Ambulatory Visit (HOSPITAL_BASED_OUTPATIENT_CLINIC_OR_DEPARTMENT_OTHER): Payer: Self-pay

## 2022-02-21 ENCOUNTER — Other Ambulatory Visit (HOSPITAL_BASED_OUTPATIENT_CLINIC_OR_DEPARTMENT_OTHER): Payer: Self-pay

## 2022-02-21 NOTE — Progress Notes (Unsigned)
Chief Complaint:   OBESITY Shelley Ramirez is here to discuss her progress with her obesity treatment plan along with follow-up of her obesity related diagnoses. Shelley Ramirez is on the Category 1 Plan and states she is following her eating plan approximately 85% of the time. Shelley Ramirez states she is doing 0 minutes 0 times per week.  Today's visit was #: 18 Starting weight: 205 lbs Starting date: 03/25/2021 Today's weight: 166 lbs Today's date: 02/15/2022 Total lbs lost to date: 39 Total lbs lost since last in-office visit: 1  Interim History: Shelley Ramirez is down an additional pound since her last visit.  Subjective:   1. Polyphagia Shelley Ramirez has been on Mounjaro, and she is tolerating it at 10 mg weekly.  She notes this helps with her appetite.  She is currently on birth control.  2. Nausea without vomiting Shelley Ramirez is on Zofran and she notes less nausea.   3. Insulin resistance Shelley Ramirez is on Mounjaro 10 mg.   Assessment/Plan:   1. Polyphagia Shelley Ramirez will continue Mounjaro, if she has issues then we will switch to Trulicity if no coverage.   2. Nausea without vomiting Shelley Ramirez will continue Zofran 1 tablet every 8 hours as needed, and we will refill for 1 month.  - ondansetron (ZOFRAN-ODT) 4 MG disintegrating tablet; Take 1 tablet (4 mg total) by mouth every 8 (eight) hours as needed for nausea or vomiting.  Dispense: 20 tablet; Refill: 0  3. Insulin resistance Shelley Ramirez will continue Mounjaro 10 mg once weekly, and we will refill for 1 month.  - tirzepatide (MOUNJARO) 10 MG/0.5ML Pen; Inject 10 mg into the skin once a week.  Dispense: 2 mL; Refill: 0  4. Obesity, current BMI 31.4 Shelley Ramirez is currently in the action stage of change. As such, her goal is to continue with weight loss efforts. She has agreed to the Category 1 Plan.   Meal planning was discussed. She will adhere closely to the plan.  Exercise goals: Walk the stairs at work.  Behavioral modification strategies: increasing lean protein intake,  decreasing simple carbohydrates, increasing vegetables, increasing water intake, decreasing eating out, no skipping meals, meal planning and cooking strategies, keeping healthy foods in the home, and planning for success.  Shelley Ramirez has agreed to follow-up with our clinic in 4 weeks. She was informed of the importance of frequent follow-up visits to maximize her success with intensive lifestyle modifications for her multiple health conditions.   Objective:   Blood pressure 105/72, pulse 90, temperature 98.4 F (36.9 C), height 5\' 1"  (1.549 m), weight 166 lb (75.3 kg), SpO2 99 %, unknown if currently breastfeeding. Body mass index is 31.37 kg/m.  General: Cooperative, alert, well developed, in no acute distress. HEENT: Conjunctivae and lids unremarkable. Cardiovascular: Regular rhythm.  Lungs: Normal work of breathing. Neurologic: No focal deficits.   Lab Results  Component Value Date   CREATININE 0.86 10/07/2021   BUN 8 10/07/2021   NA 139 10/07/2021   K 4.1 10/07/2021   CL 103 10/07/2021   CO2 23 10/07/2021   Lab Results  Component Value Date   ALT 11 10/07/2021   AST 15 10/07/2021   ALKPHOS 73 10/07/2021   BILITOT 0.3 10/07/2021   Lab Results  Component Value Date   HGBA1C 5.2 10/07/2021   HGBA1C 5.5 03/25/2021   Lab Results  Component Value Date   INSULIN 10.2 10/07/2021   INSULIN 8.5 03/25/2021   No results found for: "TSH" Lab Results  Component Value Date   CHOL 188  10/07/2021   HDL 45 10/07/2021   LDLCALC 119 (H) 10/07/2021   TRIG 136 10/07/2021   Lab Results  Component Value Date   VD25OH 15.0 (L) 10/07/2021   VD25OH 10.5 (L) 03/25/2021   Lab Results  Component Value Date   WBC 6.1 10/13/2021   HGB 9.6 (L) 10/13/2021   HCT 30.7 (L) 10/13/2021   MCV 78.5 (L) 10/13/2021   PLT 519 (H) 10/13/2021   Lab Results  Component Value Date   IRON 22 (L) 10/07/2021   TIBC 415 10/07/2021   FERRITIN 9 (L) 10/07/2021   Attestation Statements:   Reviewed by  clinician on day of visit: allergies, medications, problem list, medical history, surgical history, family history, social history, and previous encounter notes.   Trude Mcburney, am acting as Energy manager for [employer], DO.  I have reviewed the above documentation for accuracy and completeness, and I agree with the above. Corinna Capra, DO

## 2022-02-22 ENCOUNTER — Encounter (INDEPENDENT_AMBULATORY_CARE_PROVIDER_SITE_OTHER): Payer: Self-pay | Admitting: Bariatrics

## 2022-02-22 ENCOUNTER — Other Ambulatory Visit (HOSPITAL_BASED_OUTPATIENT_CLINIC_OR_DEPARTMENT_OTHER): Payer: Self-pay

## 2022-02-23 ENCOUNTER — Other Ambulatory Visit (HOSPITAL_BASED_OUTPATIENT_CLINIC_OR_DEPARTMENT_OTHER): Payer: Self-pay

## 2022-02-24 ENCOUNTER — Other Ambulatory Visit (HOSPITAL_BASED_OUTPATIENT_CLINIC_OR_DEPARTMENT_OTHER): Payer: Self-pay

## 2022-03-02 ENCOUNTER — Encounter (INDEPENDENT_AMBULATORY_CARE_PROVIDER_SITE_OTHER): Payer: Self-pay

## 2022-03-03 ENCOUNTER — Other Ambulatory Visit (HOSPITAL_BASED_OUTPATIENT_CLINIC_OR_DEPARTMENT_OTHER): Payer: Self-pay

## 2022-03-04 ENCOUNTER — Other Ambulatory Visit (HOSPITAL_BASED_OUTPATIENT_CLINIC_OR_DEPARTMENT_OTHER): Payer: Self-pay

## 2022-03-08 ENCOUNTER — Other Ambulatory Visit (HOSPITAL_BASED_OUTPATIENT_CLINIC_OR_DEPARTMENT_OTHER): Payer: Self-pay

## 2022-03-10 ENCOUNTER — Other Ambulatory Visit (HOSPITAL_BASED_OUTPATIENT_CLINIC_OR_DEPARTMENT_OTHER): Payer: Self-pay

## 2022-03-15 ENCOUNTER — Encounter (INDEPENDENT_AMBULATORY_CARE_PROVIDER_SITE_OTHER): Payer: Self-pay | Admitting: Bariatrics

## 2022-03-15 ENCOUNTER — Ambulatory Visit (INDEPENDENT_AMBULATORY_CARE_PROVIDER_SITE_OTHER): Payer: 59 | Admitting: Bariatrics

## 2022-03-15 ENCOUNTER — Other Ambulatory Visit (HOSPITAL_BASED_OUTPATIENT_CLINIC_OR_DEPARTMENT_OTHER): Payer: Self-pay

## 2022-03-15 VITALS — BP 110/72 | HR 81 | Temp 97.7°F | Ht 61.0 in | Wt 168.0 lb

## 2022-03-15 DIAGNOSIS — E8881 Metabolic syndrome: Secondary | ICD-10-CM | POA: Diagnosis not present

## 2022-03-15 DIAGNOSIS — Z7985 Long-term (current) use of injectable non-insulin antidiabetic drugs: Secondary | ICD-10-CM | POA: Diagnosis not present

## 2022-03-15 DIAGNOSIS — R110 Nausea: Secondary | ICD-10-CM | POA: Insufficient documentation

## 2022-03-15 DIAGNOSIS — E559 Vitamin D deficiency, unspecified: Secondary | ICD-10-CM

## 2022-03-15 DIAGNOSIS — Z91018 Allergy to other foods: Secondary | ICD-10-CM

## 2022-03-15 DIAGNOSIS — E669 Obesity, unspecified: Secondary | ICD-10-CM | POA: Diagnosis not present

## 2022-03-15 DIAGNOSIS — E6609 Other obesity due to excess calories: Secondary | ICD-10-CM | POA: Insufficient documentation

## 2022-03-15 DIAGNOSIS — Z6831 Body mass index (BMI) 31.0-31.9, adult: Secondary | ICD-10-CM | POA: Diagnosis not present

## 2022-03-15 MED ORDER — ONDANSETRON 4 MG PO TBDP
4.0000 mg | ORAL_TABLET | Freq: Three times a day (TID) | ORAL | 0 refills | Status: DC | PRN
  Filled 2022-03-15: qty 18, 6d supply, fill #0
  Filled 2022-04-12: qty 2, 1d supply, fill #1

## 2022-03-15 MED ORDER — TIRZEPATIDE 10 MG/0.5ML ~~LOC~~ SOAJ
10.0000 mg | SUBCUTANEOUS | 0 refills | Status: DC
  Filled 2022-03-15 – 2022-03-16 (×3): qty 2, 28d supply, fill #0

## 2022-03-15 MED ORDER — VITAMIN D (ERGOCALCIFEROL) 1.25 MG (50000 UNIT) PO CAPS
ORAL_CAPSULE | ORAL | 0 refills | Status: DC
  Filled 2022-03-15: qty 8, 24d supply, fill #0

## 2022-03-15 MED ORDER — EPINEPHRINE 0.3 MG/0.3ML IJ SOAJ
0.3000 mg | INTRAMUSCULAR | 1 refills | Status: AC | PRN
  Filled 2022-03-15: qty 2, 15d supply, fill #0
  Filled 2023-02-04: qty 2, 15d supply, fill #1

## 2022-03-16 ENCOUNTER — Encounter: Payer: Self-pay | Admitting: Family

## 2022-03-16 ENCOUNTER — Other Ambulatory Visit (HOSPITAL_BASED_OUTPATIENT_CLINIC_OR_DEPARTMENT_OTHER): Payer: Self-pay

## 2022-03-21 NOTE — Progress Notes (Unsigned)
Chief Complaint:   OBESITY Shelley Ramirez is here to discuss her progress with her obesity treatment plan along with follow-up of her obesity related diagnoses. Shelley Ramirez is on the Category 1 Plan and states she is following her eating plan approximately 0% of the time. Shelley Ramirez states she is not currently exercising.  Today's visit was #: 19 Starting weight: 205 lbs Starting date: 03/25/2021 Today's weight: 168 lbs Today's date: 03/15/22 Total lbs lost to date: 37 Total lbs lost since last in-office visit: +2  Interim History: She is up 2 pounds since her last visit.  She has been off the medicine for 3 to 4 weeks ***  Subjective:   1. Insulin resistance Taking Mounjaro.  Occasional nausea.  2. Vitamin D deficiency Tolerating vitamin D.  3. Nausea without vomiting Zofran helps with nausea.  4. Multiple food allergies ***  Assessment/Plan:   1. Insulin resistance Refill - tirzepatide (MOUNJARO) 10 MG/0.5ML Pen; Inject 10 mg into the skin once a week.  Dispense: 2 mL; Refill: 0  2. Vitamin D deficiency Refill - Vitamin D, Ergocalciferol, (DRISDOL) 1.25 MG (50000 UNIT) CAPS capsule; Take 1 capsule by mouth every 3 days  Dispense: 8 capsule; Refill: 0  3. Nausea without vomiting Refill - ondansetron (ZOFRAN-ODT) 4 MG disintegrating tablet; Take 1 tablet (4 mg total) by mouth every 8 (eight) hours as needed for nausea or vomiting.  Dispense: 20 tablet; Refill: 0  4. Multiple food allergies Refill - EPINEPHrine (EPIPEN 2-PAK) 0.3 mg/0.3 mL IJ SOAJ injection; Inject 0.3 mg into the muscle as needed for anaphylaxis. Use in event of severe allergic reaction (throat closing, unable to breath, about to pass out or faint).  Dispense: 2 each; Refill: 1  5. Obesity, current BMI 31.9 1.  Meal planning. 2.  Mindful eating.  Shelley Ramirez is currently in the action stage of change. As such, her goal is to continue with weight loss efforts. She has agreed to the Category 1 Plan.   Exercise goals:  All adults should avoid inactivity. Some physical activity is better than none, and adults who participate in any amount of physical activity gain some health benefits.  Behavioral modification strategies: increasing lean protein intake, decreasing simple carbohydrates, increasing vegetables, increasing water intake, decreasing eating out, no skipping meals, meal planning and cooking strategies, keeping healthy foods in the home, and planning for success.  Shelley Ramirez has agreed to follow-up with our clinic in 4 weeks, fasting. She was informed of the importance of frequent follow-up visits to maximize her success with intensive lifestyle modifications for her multiple health conditions.    Blood pressure 110/72, pulse 81, temperature 97.7 F (36.5 C), height 5\' 1"  (1.549 m), weight 168 lb (76.2 kg), SpO2 100 %, unknown if currently breastfeeding. Body mass index is 31.74 kg/m.  General: Cooperative, alert, well developed, in no acute distress. HEENT: Conjunctivae and lids unremarkable. Cardiovascular: Regular rhythm.  Lungs: Normal work of breathing. Neurologic: No focal deficits.   Lab Results  Component Value Date   CREATININE 0.86 10/07/2021   BUN 8 10/07/2021   NA 139 10/07/2021   K 4.1 10/07/2021   CL 103 10/07/2021   CO2 23 10/07/2021   Lab Results  Component Value Date   ALT 11 10/07/2021   AST 15 10/07/2021   ALKPHOS 73 10/07/2021   BILITOT 0.3 10/07/2021   Lab Results  Component Value Date   HGBA1C 5.2 10/07/2021   HGBA1C 5.5 03/25/2021   Lab Results  Component Value Date  INSULIN 10.2 10/07/2021   INSULIN 8.5 03/25/2021   No results found for: "TSH" Lab Results  Component Value Date   CHOL 188 10/07/2021   HDL 45 10/07/2021   LDLCALC 119 (H) 10/07/2021   TRIG 136 10/07/2021   Lab Results  Component Value Date   VD25OH 15.0 (L) 10/07/2021   VD25OH 10.5 (L) 03/25/2021   Lab Results  Component Value Date   WBC 6.1 10/13/2021   HGB 9.6 (L) 10/13/2021    HCT 30.7 (L) 10/13/2021   MCV 78.5 (L) 10/13/2021   PLT 519 (H) 10/13/2021   Lab Results  Component Value Date   IRON 22 (L) 10/07/2021   TIBC 415 10/07/2021   FERRITIN 9 (L) 10/07/2021     Attestation Statements:   Reviewed by clinician on day of visit: allergies, medications, problem list, medical history, surgical history, family history, social history, and previous encounter notes.  I, Dawn Whitmire, FNP-C, am acting as transcriptionist for Dr. Corinna Capra.  I have reviewed the above documentation for accuracy and completeness, and I agree with the above. -  ***

## 2022-03-22 ENCOUNTER — Encounter (INDEPENDENT_AMBULATORY_CARE_PROVIDER_SITE_OTHER): Payer: Self-pay | Admitting: Bariatrics

## 2022-04-12 ENCOUNTER — Other Ambulatory Visit (HOSPITAL_BASED_OUTPATIENT_CLINIC_OR_DEPARTMENT_OTHER): Payer: Self-pay

## 2022-04-19 ENCOUNTER — Ambulatory Visit (INDEPENDENT_AMBULATORY_CARE_PROVIDER_SITE_OTHER): Payer: 59 | Admitting: Bariatrics

## 2022-04-19 ENCOUNTER — Other Ambulatory Visit (HOSPITAL_BASED_OUTPATIENT_CLINIC_OR_DEPARTMENT_OTHER): Payer: Self-pay

## 2022-04-19 ENCOUNTER — Encounter: Payer: Self-pay | Admitting: Bariatrics

## 2022-04-19 ENCOUNTER — Other Ambulatory Visit: Payer: Self-pay | Admitting: Bariatrics

## 2022-04-19 VITALS — BP 108/70 | HR 77 | Temp 97.9°F | Ht 61.0 in | Wt 164.0 lb

## 2022-04-19 DIAGNOSIS — Z6831 Body mass index (BMI) 31.0-31.9, adult: Secondary | ICD-10-CM

## 2022-04-19 DIAGNOSIS — E669 Obesity, unspecified: Secondary | ICD-10-CM

## 2022-04-19 DIAGNOSIS — R632 Polyphagia: Secondary | ICD-10-CM

## 2022-04-19 DIAGNOSIS — E8881 Metabolic syndrome: Secondary | ICD-10-CM

## 2022-04-19 DIAGNOSIS — E6609 Other obesity due to excess calories: Secondary | ICD-10-CM

## 2022-04-19 DIAGNOSIS — R110 Nausea: Secondary | ICD-10-CM | POA: Diagnosis not present

## 2022-04-19 DIAGNOSIS — D508 Other iron deficiency anemias: Secondary | ICD-10-CM | POA: Diagnosis not present

## 2022-04-19 DIAGNOSIS — Z0000 Encounter for general adult medical examination without abnormal findings: Secondary | ICD-10-CM | POA: Insufficient documentation

## 2022-04-19 DIAGNOSIS — E559 Vitamin D deficiency, unspecified: Secondary | ICD-10-CM | POA: Diagnosis not present

## 2022-04-19 MED ORDER — MOUNJARO 10 MG/0.5ML ~~LOC~~ SOAJ
SUBCUTANEOUS | 0 refills | Status: DC
  Filled 2022-04-19: qty 2, 28d supply, fill #0

## 2022-04-19 MED ORDER — ONDANSETRON 4 MG PO TBDP
4.0000 mg | ORAL_TABLET | Freq: Three times a day (TID) | ORAL | 0 refills | Status: DC | PRN
  Filled 2022-04-19: qty 16, 6d supply, fill #0
  Filled 2022-05-21: qty 16, 6d supply, fill #1

## 2022-04-19 MED ORDER — TIRZEPATIDE 10 MG/0.5ML ~~LOC~~ SOAJ
10.0000 mg | SUBCUTANEOUS | 0 refills | Status: DC
  Filled 2022-04-19: qty 2, 28d supply, fill #0

## 2022-04-20 ENCOUNTER — Other Ambulatory Visit (HOSPITAL_BASED_OUTPATIENT_CLINIC_OR_DEPARTMENT_OTHER): Payer: Self-pay

## 2022-04-20 ENCOUNTER — Encounter: Payer: Self-pay | Admitting: Bariatrics

## 2022-04-20 LAB — COMPREHENSIVE METABOLIC PANEL
ALT: 10 IU/L (ref 0–32)
AST: 14 IU/L (ref 0–40)
Albumin/Globulin Ratio: 1.8 (ref 1.2–2.2)
Albumin: 4.2 g/dL (ref 3.9–4.9)
Alkaline Phosphatase: 59 IU/L (ref 44–121)
BUN/Creatinine Ratio: 7 — ABNORMAL LOW (ref 9–23)
BUN: 6 mg/dL (ref 6–24)
Bilirubin Total: 0.3 mg/dL (ref 0.0–1.2)
CO2: 24 mmol/L (ref 20–29)
Calcium: 9.1 mg/dL (ref 8.7–10.2)
Chloride: 102 mmol/L (ref 96–106)
Creatinine, Ser: 0.85 mg/dL (ref 0.57–1.00)
Globulin, Total: 2.3 g/dL (ref 1.5–4.5)
Glucose: 75 mg/dL (ref 70–99)
Potassium: 4.1 mmol/L (ref 3.5–5.2)
Sodium: 139 mmol/L (ref 134–144)
Total Protein: 6.5 g/dL (ref 6.0–8.5)
eGFR: 88 mL/min/{1.73_m2} (ref >59)

## 2022-04-20 LAB — LIPID PANEL WITH LDL/HDL RATIO
Cholesterol, Total: 162 mg/dL (ref 100–199)
HDL: 48 mg/dL (ref >39)
LDL Chol Calc (NIH): 95 mg/dL (ref 0–99)
LDL/HDL Ratio: 2 ratio (ref 0.0–3.2)
Triglycerides: 107 mg/dL (ref 0–149)
VLDL Cholesterol Cal: 19 mg/dL (ref 5–40)

## 2022-04-20 LAB — ANEMIA PANEL
Ferritin: 73 ng/mL (ref 15–150)
Folate, Hemolysate: 268 ng/mL
Folate, RBC: 718 ng/mL (ref >498)
Hematocrit: 37.3 % (ref 34.0–46.6)
Iron Saturation: 18 % (ref 15–55)
Iron: 49 ug/dL (ref 27–159)
Retic Ct Pct: 1.3 % (ref 0.6–2.6)
Total Iron Binding Capacity: 265 ug/dL (ref 250–450)
UIBC: 216 ug/dL (ref 131–425)
Vitamin B-12: 241 pg/mL (ref 232–1245)

## 2022-04-20 LAB — HEMOGLOBIN A1C
Est. average glucose Bld gHb Est-mCnc: 97 mg/dL
Hgb A1c MFr Bld: 5 % (ref 4.8–5.6)

## 2022-04-20 LAB — VITAMIN D 25 HYDROXY (VIT D DEFICIENCY, FRACTURES): Vit D, 25-Hydroxy: 22 ng/mL — ABNORMAL LOW (ref 30.0–100.0)

## 2022-04-20 LAB — INSULIN, RANDOM: INSULIN: 17.8 u[IU]/mL (ref 2.6–24.9)

## 2022-04-20 NOTE — Progress Notes (Signed)
Chief Complaint:   OBESITY Shelley Ramirez is here to discuss her progress with her obesity treatment plan along with follow-up of her obesity related diagnoses. Shelley Ramirez is on the Category 1 Plan and states she is following her eating plan approximately 85% of the time. Shelley Ramirez states she is doing 0 minutes 0 times per week.  Today's visit was #: 20 Starting weight: 205 lbs Starting date: 03/25/2021 Today's weight: 164 lbs Today's date: 04/19/2022 Total lbs lost to date: 41 Total lbs lost since last in-office visit: 4  Interim History: Shelley Ramirez is down 4 lbs since her last visit. She is eating her meals.   Subjective:   1. Vitamin D deficiency Shelley Ramirez is taking Vitamin D.   2. Insulin resistance Shelley Ramirez notes she has decreased carbohydrates.   3. Nausea without vomiting Shelley Ramirez is taking Zofran.   4. Polyphagia Shelley Ramirez notes decrease in appetite.   5. Healthcare maintenance Shelley Ramirez is due for lab recheck.   6. Other iron deficiency anemia (See previous anemia panel). Shelley Ramirez had iron infusion.   Assessment/Plan:   1. Vitamin D deficiency We will check labs today. Shelley Ramirez will continue Vitamin D as directed.   - VITAMIN D 25 Hydroxy (Vit-D Deficiency, Fractures)  2. Insulin resistance We will check labs today. Shelley Ramirez will continue Mounjaro 10 mg once weekly, and we will refill for 1 month.  - tirzepatide (MOUNJARO) 10 MG/0.5ML Pen; Inject 10 mg into the skin once a week.  Dispense: 2 mL; Refill: 0 - Comprehensive metabolic panel - Hemoglobin A1c - Anemia panel  3. Nausea without vomiting We will check labs today, and we will refill Zofran 4 mg every 8 hours as needed for 1 month.  - ondansetron (ZOFRAN-ODT) 4 MG disintegrating tablet; Take 1 tablet (4 mg total) by mouth every 8 (eight) hours as needed for nausea or vomiting.  Dispense: 30 tablet; Refill: 0 - Comprehensive metabolic panel - Ferritin - Lipid Panel With LDL/HDL Ratio - Insulin, random - Hemoglobin A1c  4.  Polyphagia Shelley Ramirez will continue Mounjaro as directed.   - tirzepatide (MOUNJARO) 10 MG/0.5ML Pen; Inject 10 mg into the skin once a week.  Dispense: 2 mL; Refill: 0  5. Healthcare maintenance We will check labs today, and we will [facility] at [facility] visit.  - Comprehensive metabolic panel - Ferritin - Lipid Panel With LDL/HDL Ratio - Insulin, random - Hemoglobin A1c - VITAMIN D 25 Hydroxy (Vit-D Deficiency, Fractures) - Anemia panel  6. Other iron deficiency anemia We will check labs today, and we will follow-up at [facility] next visit.  - Ferritin - Anemia panel  7. Obesity, current BMI 31.1 Shelley Ramirez is currently in the action stage of change. As such, her goal is to continue with weight loss efforts. She has agreed to the Category 1 Plan.   Meal planning and intentional eating were discussed. Increase fiber.   Exercise goals: Will start exercising 2 times per week.   Behavioral modification strategies: increasing lean protein intake, decreasing simple carbohydrates, increasing vegetables, increasing water intake, decreasing eating out, no skipping meals, meal planning and cooking strategies, keeping healthy foods in the home, and planning for success.  Shelley Ramirez has agreed to follow-up with our clinic in 4 weeks. She was informed of the importance of frequent follow-up visits to maximize her success with intensive lifestyle modifications for her multiple health conditions.   Shelley Ramirez was informed we would discuss her lab results at her next visit unless there is a critical issue that needs to  be addressed sooner. Shelley Ramirez agreed to keep her next visit at the agreed upon time to discuss these results.  Objective:   Blood pressure 108/70, pulse 77, temperature 97.9 F (36.6 C), height 5\' 1"  (1.549 m), weight 164 lb (74.4 kg), SpO2 99 %, unknown if currently breastfeeding. Body mass index is 30.99 kg/m.  General: Cooperative, alert, well developed, in no acute distress. HEENT:  Conjunctivae and lids unremarkable. Cardiovascular: Regular rhythm.  Lungs: Normal work of breathing. Neurologic: No focal deficits.   Lab Results  Component Value Date   CREATININE 0.86 10/07/2021   BUN 8 10/07/2021   NA 139 10/07/2021   K 4.1 10/07/2021   CL 103 10/07/2021   CO2 23 10/07/2021   Lab Results  Component Value Date   ALT 11 10/07/2021   AST 15 10/07/2021   ALKPHOS 73 10/07/2021   BILITOT 0.3 10/07/2021   Lab Results  Component Value Date   HGBA1C 5.2 10/07/2021   HGBA1C 5.5 03/25/2021   Lab Results  Component Value Date   INSULIN 10.2 10/07/2021   INSULIN 8.5 03/25/2021   No results found for: "TSH" Lab Results  Component Value Date   CHOL 188 10/07/2021   HDL 45 10/07/2021   LDLCALC 119 (H) 10/07/2021   TRIG 136 10/07/2021   Lab Results  Component Value Date   VD25OH 15.0 (L) 10/07/2021   VD25OH 10.5 (L) 03/25/2021   Lab Results  Component Value Date   WBC 6.1 10/13/2021   HGB 9.6 (L) 10/13/2021   HCT 30.7 (L) 10/13/2021   MCV 78.5 (L) 10/13/2021   PLT 519 (H) 10/13/2021   Lab Results  Component Value Date   IRON 22 (L) 10/07/2021   TIBC 415 10/07/2021   FERRITIN 9 (L) 10/07/2021   Attestation Statements:   Reviewed by clinician on day of visit: allergies, medications, problem list, medical history, surgical history, family history, social history, and previous encounter notes.   10/09/2021, am acting as Trude Mcburney for Energy manager, DO.  I have reviewed the above documentation for accuracy and completeness, and I agree with the above. [employer], DO

## 2022-04-21 ENCOUNTER — Other Ambulatory Visit (HOSPITAL_BASED_OUTPATIENT_CLINIC_OR_DEPARTMENT_OTHER): Payer: Self-pay

## 2022-04-29 ENCOUNTER — Other Ambulatory Visit (HOSPITAL_BASED_OUTPATIENT_CLINIC_OR_DEPARTMENT_OTHER): Payer: Self-pay

## 2022-05-04 ENCOUNTER — Other Ambulatory Visit (HOSPITAL_BASED_OUTPATIENT_CLINIC_OR_DEPARTMENT_OTHER): Payer: Self-pay

## 2022-05-17 ENCOUNTER — Ambulatory Visit: Payer: 59 | Admitting: Bariatrics

## 2022-05-23 ENCOUNTER — Other Ambulatory Visit (HOSPITAL_BASED_OUTPATIENT_CLINIC_OR_DEPARTMENT_OTHER): Payer: Self-pay

## 2022-05-23 ENCOUNTER — Other Ambulatory Visit: Payer: Self-pay | Admitting: Bariatrics

## 2022-05-23 DIAGNOSIS — R110 Nausea: Secondary | ICD-10-CM

## 2022-06-01 ENCOUNTER — Other Ambulatory Visit (HOSPITAL_BASED_OUTPATIENT_CLINIC_OR_DEPARTMENT_OTHER): Payer: Self-pay

## 2022-06-01 ENCOUNTER — Encounter: Payer: Self-pay | Admitting: Bariatrics

## 2022-06-01 ENCOUNTER — Ambulatory Visit: Payer: 59 | Admitting: Bariatrics

## 2022-06-01 VITALS — BP 99/68 | HR 87 | Temp 97.8°F | Ht 61.0 in | Wt 163.0 lb

## 2022-06-01 DIAGNOSIS — E669 Obesity, unspecified: Secondary | ICD-10-CM

## 2022-06-01 DIAGNOSIS — E559 Vitamin D deficiency, unspecified: Secondary | ICD-10-CM

## 2022-06-01 DIAGNOSIS — R110 Nausea: Secondary | ICD-10-CM

## 2022-06-01 DIAGNOSIS — E88819 Insulin resistance, unspecified: Secondary | ICD-10-CM | POA: Diagnosis not present

## 2022-06-01 DIAGNOSIS — E6609 Other obesity due to excess calories: Secondary | ICD-10-CM

## 2022-06-01 DIAGNOSIS — R632 Polyphagia: Secondary | ICD-10-CM

## 2022-06-01 DIAGNOSIS — Z6830 Body mass index (BMI) 30.0-30.9, adult: Secondary | ICD-10-CM | POA: Diagnosis not present

## 2022-06-01 MED ORDER — TIRZEPATIDE 10 MG/0.5ML ~~LOC~~ SOAJ: 10.0000 mg | SUBCUTANEOUS | 0 refills | Status: DC

## 2022-06-01 MED ORDER — TIRZEPATIDE 10 MG/0.5ML ~~LOC~~ SOAJ
10.0000 mg | SUBCUTANEOUS | 0 refills | Status: DC
  Filled 2022-06-01: qty 2, 28d supply, fill #0

## 2022-06-01 MED ORDER — ONDANSETRON 4 MG PO TBDP
4.0000 mg | ORAL_TABLET | Freq: Three times a day (TID) | ORAL | 0 refills | Status: DC | PRN
  Filled 2022-06-01: qty 18, 6d supply, fill #0

## 2022-06-01 MED ORDER — VITAMIN D (ERGOCALCIFEROL) 1.25 MG (50000 UNIT) PO CAPS
ORAL_CAPSULE | ORAL | 0 refills | Status: DC
  Filled 2022-06-01: qty 8, 24d supply, fill #0

## 2022-06-01 MED ORDER — ONDANSETRON 4 MG PO TBDP: 4.0000 mg | ORAL_TABLET | Freq: Three times a day (TID) | ORAL | 0 refills | Status: DC | PRN

## 2022-06-01 MED ORDER — VITAMIN D (ERGOCALCIFEROL) 1.25 MG (50000 UNIT) PO CAPS: ORAL_CAPSULE | ORAL | 0 refills | Status: DC

## 2022-06-09 NOTE — Progress Notes (Deleted)
NEUROLOGY FOLLOW UP OFFICE NOTE  Brittney Mucha [id number]  Assessment/Plan:   Migraine without aura, without status migrainosus, not intractable   Migraine prevention:  Aimovig140mg  every 28 days.  Also on amitriptyline to help sleep Migraine rescue:  sumatriptan 50mg  with Zofran. Limit use of pain relievers to no more than 2 days out of week to prevent risk of rebound or medication-overuse headache. Keep headache diary Discussed lifestyle modification:  increase water intake, caffeine cessation, no skipping meals Follow up ***   Subjective:  Shelley Ramirez is a 41 year old female who follows up for migraines.  UPDATE: Started Aimovig. Intensity:  *** Duration:  *** Frequency:  *** Current NSAIDS/analgesics:  Excedrin Current triptans:  sumatriptan 50mg  Current ergotamine:  none Current anti-emetic:  Zofran ODT 4mg  Current muscle relaxants:  none Current Antihypertensive medications:  none Current Antidepressant medications:  amitriptyline 25mg  QHS Current Anticonvulsant medications:  none Current anti-CGRP:  Aimovig 140mg  Q28d Current Vitamins/Herbal/Supplements:  none Current Antihistamines/Decongestants:  none Other therapy:  none Hormone/birth control:  none  Caffeine:  Grande size frapp every morning Diet:  Sprite daily.  No soda.  Skips meals Exercise:  no Depression:  yes; Anxiety:  no Sleep hygiene:  poor.  Taking amitriptyline which helps  HISTORY:  Onset:  42-33 years old Location:  right sided - forehead/retro-orbital Quality:  pressure Intensity:  5-6/10, 8-10/10 beginning 2 weeks before period Aura:  absent Prodrome:  absent Associated symptoms:  Nausea, photophobia. Phonophobia, osmophobia, sometimes blurred vision if severe. She denies associated unilateral numbness or weakness. Duration: 1-2 days, but 30 minutes with sumatriptan but more recently lasting longer. Often will wait an hour before taking it. Frequency:  Used to occur  randomly.  For past 2 years, there is a menstrual component.  Total 20 days a month.   Triggers:  strong smells (perfumes), bright lights/sunlight, heat, loud noise, menstrual cycle Relieving factors:  Pregnancy (miscarriage) Activity:  aggravates      Past NSAIDS/analgesics:  Tylenol, ibuprofen, BC powder, Excedrin Past abortive triptans:  none Past abortive ergotamine:  none Past muscle relaxants:  none Past anti-emetic:  promethazine Past antihypertensive medications:  none Past antidepressant medications:  bupropion Past anticonvulsant medications:  topiramate Past anti-CGRP:  none Past vitamins/Herbal/Supplements:  D Past antihistamines/decongestants:  none Other past therapies:  none    Family history of headache:  mother (migraines), brother and sister (migraines)  PAST MEDICAL HISTORY: Past Medical History:  Diagnosis Date   Acid reflux    Anemia    Lactose intolerance    Other fatigue    SOB (shortness of breath) on exertion    Vitamin D deficiency     MEDICATIONS: Current Outpatient Medications on File Prior to Visit  Medication Sig Dispense Refill   amitriptyline (ELAVIL) 25 MG tablet Take 25 mg by mouth at bedtime as needed for sleep.     EPINEPHrine (EPIPEN 2-PAK) 0.3 mg/0.3 mL IJ SOAJ injection Inject 0.3 mg into the muscle as needed for anaphylaxis. Use in event of severe allergic reaction (throat closing, unable to breath, about to pass out or faint). (Patient not taking: Reported on 06/01/2022) 2 each 1   Erenumab-aooe (AIMOVIG) 140 MG/ML SOAJ Inject 140 mg into the skin every 28 (twenty-eight) days. 1 mL 5   ondansetron (ZOFRAN-ODT) 4 MG disintegrating tablet Take 1 tablet (4 mg total) by mouth every 8 (eight) hours as needed for nausea or vomiting. 30 tablet 0   SUMAtriptan (IMITREX) 50 MG tablet Take 1 Tablet By  Mouth 1-2 Times Daily 10 tablet 1   SUMAtriptan (IMITREX) 50 MG tablet Take 1 tablet by mouth at onset of migraine. May repeat in 2 hours if  headache persists; limit 4 tablets in 24 hours by mouth. 30 tablet 1   tirzepatide (MOUNJARO) 10 MG/0.5ML Pen Inject 10 mg into the skin once a week 2 mL 0   tirzepatide (MOUNJARO) 10 MG/0.5ML Pen Inject 10 mg into the skin once a week. 2 mL 0   Vitamin D, Ergocalciferol, (DRISDOL) 1.25 MG (50000 UNIT) CAPS capsule Take 1 capsule by mouth every 3 days 8 capsule 0   No current facility-administered medications on file prior to visit.    ALLERGIES: Allergies  Allergen Reactions   Codeine Shortness Of Breath and Other (See Comments)    Hyperventilation, pass out, loss off consciousness     Clindamycin Nausea And Vomiting   Penicillin G Rash    FAMILY HISTORY: Family History  Problem Relation Age of Onset   Hypertension Mother    Kidney disease Mother    Sudden death Mother    Stroke Father    Hypertension Father    Alcoholism Father    Cancer Maternal Aunt    Diabetes Maternal Aunt       Objective:  *** General: No acute distress.  Patient appears well-groomed.   Head:  Normocephalic/atraumatic Eyes:  Fundi examined but not visualized Neck: supple, no paraspinal tenderness, full range of motion Heart:  Regular rate and rhythm Neurological Exam: ***   Shon Millet, DO  CC: Beverley Fiedler, MD

## 2022-06-13 ENCOUNTER — Ambulatory Visit: Payer: 59 | Admitting: Neurology

## 2022-06-13 ENCOUNTER — Encounter: Payer: Self-pay | Admitting: Neurology

## 2022-06-13 ENCOUNTER — Encounter: Payer: Self-pay | Admitting: Bariatrics

## 2022-06-13 DIAGNOSIS — Z029 Encounter for administrative examinations, unspecified: Secondary | ICD-10-CM

## 2022-06-13 NOTE — Progress Notes (Signed)
Chief Complaint:   OBESITY Shelley Ramirez is here to discuss her progress with her obesity treatment plan along with follow-up of her obesity related diagnoses. Shelley Ramirez is on the Category 1 Plan and states she is following her eating plan approximately 75% of the time. Shelley Ramirez states she is doing 0 minutes 0 times per week.  Today's visit was #: 21 Starting weight: 205 lbs Starting date: 03/25/2021 Today's weight: 163 lbs Today's date: 06/01/2022 Total lbs lost to date: 42 Total lbs lost since last in-office visit: 1  Interim History: Shelley Ramirez is down 1 additional pound since her last visit. She missed her last appointment. She has been off the plan.   Subjective:   1. Vitamin D deficiency Shelley Ramirez is taking Vitamin D as directed.   2. Insulin resistance We discussed birthcontrol.   3. Polyphagia Shelley Ramirez has good appetite control.   4. Nausea without vomiting Shelley Ramirez's symptoms have somewhat improved.   Assessment/Plan:   1. Vitamin D deficiency We will refill prescription Vitamin D for 1 month. Shelley Ramirez will follow-up for routine testing of Vitamin D, at least 2-3 times per year to avoid over-replacement.  - Vitamin D, Ergocalciferol, (DRISDOL) 1.25 MG (50000 UNIT) CAPS capsule; Take 1 capsule by mouth every 3 days  Dispense: 8 capsule; Refill: 0  2. Insulin resistance Shelley Ramirez will continue Mounjaro 10 mg once weekly, and we will refill for 1 month.   - tirzepatide (MOUNJARO) 10 MG/0.5ML Pen; Inject 10 mg into the skin once a week.  Dispense: 2 mL; Refill: 0  3. Polyphagia Shelley Ramirez will continue Mounjaro 10 mg once weekly, and we will refill for 1 month.   - tirzepatide (MOUNJARO) 10 MG/0.5ML Pen; Inject 10 mg into the skin once a week.  Dispense: 2 mL; Refill: 0  4. Nausea without vomiting Shelley Ramirez will continue Zofran as needed, and we will refill for 1 month.   - ondansetron (ZOFRAN-ODT) 4 MG disintegrating tablet; Take 1 tablet (4 mg total) by mouth every 8 (eight) hours as needed for nausea  or vomiting.  Dispense: 30 tablet; Refill: 0  5. Obesity, current BMI 30.9 Shelley Ramirez is currently in the action stage of change. As such, her goal is to continue with weight loss efforts. She has agreed to the Category 1 Plan.   Meal planning and intentional eating were discussed.   Exercise goals: No exercise has been prescribed at this time.  Behavioral modification strategies: increasing lean protein intake, decreasing simple carbohydrates, increasing vegetables, increasing water intake, decreasing eating out, no skipping meals, meal planning and cooking strategies, keeping healthy foods in the home, and planning for success.  Shelley Ramirez has agreed to follow-up with our clinic in 4 weeks. She was informed of the importance of frequent follow-up visits to maximize her success with intensive lifestyle modifications for her multiple health conditions.   Objective:   Blood pressure 99/68, pulse 87, temperature 97.8 F (36.6 C), height 5\' 1"  (1.549 m), weight 163 lb (73.9 kg), SpO2 100 %, unknown if currently breastfeeding. Body mass index is 30.8 kg/m.  General: Cooperative, alert, well developed, in no acute distress. HEENT: Conjunctivae and lids unremarkable. Cardiovascular: Regular rhythm.  Lungs: Normal work of breathing. Neurologic: No focal deficits.   Lab Results  Component Value Date   CREATININE 0.85 04/19/2022   BUN 6 04/19/2022   NA 139 04/19/2022   K 4.1 04/19/2022   CL 102 04/19/2022   CO2 24 04/19/2022   Lab Results  Component Value Date   ALT  10 04/19/2022   AST 14 04/19/2022   ALKPHOS 59 04/19/2022   BILITOT 0.3 04/19/2022   Lab Results  Component Value Date   HGBA1C 5.0 04/19/2022   HGBA1C 5.2 10/07/2021   HGBA1C 5.5 03/25/2021   Lab Results  Component Value Date   INSULIN 17.8 04/19/2022   INSULIN 10.2 10/07/2021   INSULIN 8.5 03/25/2021   No results found for: "TSH" Lab Results  Component Value Date   CHOL 162 04/19/2022   HDL 48 04/19/2022    LDLCALC 95 04/19/2022   TRIG 107 04/19/2022   Lab Results  Component Value Date   VD25OH 22.0 (L) 04/19/2022   VD25OH 15.0 (L) 10/07/2021   VD25OH 10.5 (L) 03/25/2021   Lab Results  Component Value Date   WBC 6.1 10/13/2021   HGB 9.6 (L) 10/13/2021   HCT 37.3 04/19/2022   MCV 78.5 (L) 10/13/2021   PLT 519 (H) 10/13/2021   Lab Results  Component Value Date   IRON 49 04/19/2022   TIBC 265 04/19/2022   FERRITIN 73 04/19/2022   Attestation Statements:   Reviewed by clinician on day of visit: allergies, medications, problem list, medical history, surgical history, family history, social history, and previous encounter notes.   Trude Mcburney, am acting as Energy manager for [employer], DO.  I have reviewed the above documentation for accuracy and completeness, and I agree with the above. Corinna Capra, DO

## 2022-06-28 ENCOUNTER — Ambulatory Visit: Payer: 59 | Admitting: Bariatrics

## 2022-06-28 ENCOUNTER — Other Ambulatory Visit (HOSPITAL_BASED_OUTPATIENT_CLINIC_OR_DEPARTMENT_OTHER): Payer: Self-pay

## 2022-06-28 ENCOUNTER — Telehealth: Payer: Self-pay

## 2022-06-28 ENCOUNTER — Encounter: Payer: Self-pay | Admitting: Family

## 2022-06-28 ENCOUNTER — Encounter: Payer: Self-pay | Admitting: Bariatrics

## 2022-06-28 VITALS — BP 111/72 | HR 81 | Temp 97.7°F | Ht 61.0 in | Wt 161.0 lb

## 2022-06-28 DIAGNOSIS — E88819 Insulin resistance, unspecified: Secondary | ICD-10-CM | POA: Diagnosis not present

## 2022-06-28 DIAGNOSIS — E559 Vitamin D deficiency, unspecified: Secondary | ICD-10-CM

## 2022-06-28 DIAGNOSIS — R110 Nausea: Secondary | ICD-10-CM

## 2022-06-28 DIAGNOSIS — E669 Obesity, unspecified: Secondary | ICD-10-CM

## 2022-06-28 DIAGNOSIS — R632 Polyphagia: Secondary | ICD-10-CM | POA: Diagnosis not present

## 2022-06-28 DIAGNOSIS — Z6830 Body mass index (BMI) 30.0-30.9, adult: Secondary | ICD-10-CM

## 2022-06-28 DIAGNOSIS — E6609 Other obesity due to excess calories: Secondary | ICD-10-CM

## 2022-06-28 MED ORDER — ONDANSETRON 4 MG PO TBDP
4.0000 mg | ORAL_TABLET | Freq: Three times a day (TID) | ORAL | 0 refills | Status: DC | PRN
  Filled 2022-06-28: qty 18, 6d supply, fill #0

## 2022-06-28 MED ORDER — TIRZEPATIDE 10 MG/0.5ML ~~LOC~~ SOAJ
10.0000 mg | SUBCUTANEOUS | 0 refills | Status: DC
  Filled 2022-06-28: qty 2, 28d supply, fill #0

## 2022-06-28 MED ORDER — VITAMIN D (ERGOCALCIFEROL) 1.25 MG (50000 UNIT) PO CAPS
50000.0000 [IU] | ORAL_CAPSULE | ORAL | 0 refills | Status: DC
  Filled 2022-06-28: qty 8, 24d supply, fill #0

## 2022-06-28 NOTE — Telephone Encounter (Signed)
Patient has been receiving bills from her appts here at [facility] patient would like all her appt to be refilled she has the [facility].

## 2022-07-05 NOTE — Telephone Encounter (Signed)
Sent email to coding and billing  

## 2022-07-13 ENCOUNTER — Encounter: Payer: Self-pay | Admitting: Bariatrics

## 2022-07-13 NOTE — Progress Notes (Signed)
Chief Complaint:   OBESITY Shelley Ramirez is here to discuss her progress with her obesity treatment plan along with follow-up of her obesity related diagnoses. Shelley Ramirez is on the Category 1 Plan and states she is following her eating plan approximately 90% of the time. Shelley Ramirez states she is doing 0 minutes 0 times per week.  Today's visit was #: 19 Starting weight: 205 lbs Starting date: 03/25/2021 Today's weight: 161 lbs Today's date: 06/28/2022 Total lbs lost to date: 44 Total lbs lost since last in-office visit: 2  Interim History: Shelley Ramirez is down an additional 2 pounds since her last visit.  She has more energy and endurance.  Subjective:   1. Vitamin D deficiency Shelley Ramirez is taking prescription Vitamin D.  2. Insulin resistance Shelley Ramirez is taking her medication as directed.   3. Polyphagia Shelley Ramirez is taking her medication as directed.   4. Nausea without vomiting Shelley Ramirez notes her symptom of nausea has improved.   Assessment/Plan:   1. Vitamin D deficiency We will refill prescription Vitamin D for 1 month. Shelley Ramirez will follow-up for routine testing of Vitamin D, at least 2-3 times per year to avoid over-replacement.  - Vitamin D, Ergocalciferol, (DRISDOL) 1.25 MG (50000 UNIT) CAPS capsule; Take 1 capsule (50,000 Units total) by mouth every 3 (three) days.  Dispense: 8 capsule; Refill: 0  2. Insulin resistance Shelley Ramirez will continue Mounjaro at 10 mg once weekly.  She will continue to work on decreasing carbohydrates (sugar and starches).   - tirzepatide (MOUNJARO) 10 MG/0.5ML Pen; Inject 10 mg into the skin once a week.  Dispense: 2 mL; Refill: 0  3. Polyphagia Shelley Ramirez will continue Mounjaro 10 mg once weekly, and we will refill for 1 month.  - tirzepatide (MOUNJARO) 10 MG/0.5ML Pen; Inject 10 mg into the skin once a week.  Dispense: 2 mL; Refill: 0  4. Nausea without vomiting Shelley Ramirez will continue Zofran 4 mg every 8 hours as needed, and we will refill for 1 month.  - ondansetron  (ZOFRAN-ODT) 4 MG disintegrating tablet; Take 1 tablet (4 mg total) by mouth every 8 (eight) hours as needed for nausea or vomiting.  Dispense: 30 tablet; Refill: 0  5. Obesity, current BMI 30.5 Shelley Ramirez is currently in the action stage of change. As such, her goal is to continue with weight loss efforts. She has agreed to the Category 1 Plan.   Meal planning was discussed.  She will continue to adhere closely to the meal plan.  Exercise goals: No exercise has been prescribed at this time.  Behavioral modification strategies: increasing lean protein intake, decreasing simple carbohydrates, increasing vegetables, increasing water intake, decreasing eating out, no skipping meals, meal planning and cooking strategies, keeping healthy foods in the home, and planning for success.  Shelley Ramirez has agreed to follow-up with our clinic in 4 weeks. She was informed of the importance of frequent follow-up visits to maximize her success with intensive lifestyle modifications for her multiple health conditions.   Objective:   Blood pressure 111/72, pulse 81, temperature 97.7 F (36.5 C), height 5\' 1"  (1.549 m), weight 161 lb (73 kg), SpO2 99 %, unknown if currently breastfeeding. Body mass index is 30.42 kg/m.  General: Cooperative, alert, well developed, in no acute distress. HEENT: Conjunctivae and lids unremarkable. Cardiovascular: Regular rhythm.  Lungs: Normal work of breathing. Neurologic: No focal deficits.   Lab Results  Component Value Date   CREATININE 0.85 04/19/2022   BUN 6 04/19/2022   NA 139 04/19/2022   K  4.1 04/19/2022   CL 102 04/19/2022   CO2 24 04/19/2022   Lab Results  Component Value Date   ALT 10 04/19/2022   AST 14 04/19/2022   ALKPHOS 59 04/19/2022   BILITOT 0.3 04/19/2022   Lab Results  Component Value Date   HGBA1C 5.0 04/19/2022   HGBA1C 5.2 10/07/2021   HGBA1C 5.5 03/25/2021   Lab Results  Component Value Date   INSULIN 17.8 04/19/2022   INSULIN 10.2  10/07/2021   INSULIN 8.5 03/25/2021   No results found for: "TSH" Lab Results  Component Value Date   CHOL 162 04/19/2022   HDL 48 04/19/2022   LDLCALC 95 04/19/2022   TRIG 107 04/19/2022   Lab Results  Component Value Date   VD25OH 22.0 (L) 04/19/2022   VD25OH 15.0 (L) 10/07/2021   VD25OH 10.5 (L) 03/25/2021   Lab Results  Component Value Date   WBC 6.1 10/13/2021   HGB 9.6 (L) 10/13/2021   HCT 37.3 04/19/2022   MCV 78.5 (L) 10/13/2021   PLT 519 (H) 10/13/2021   Lab Results  Component Value Date   IRON 49 04/19/2022   TIBC 265 04/19/2022   FERRITIN 73 04/19/2022   Attestation Statements:   Reviewed by clinician on day of visit: allergies, medications, problem list, medical history, surgical history, family history, social history, and previous encounter notes.   Trude Mcburney, am acting as Energy manager for [employer], DO.  I have reviewed the above documentation for accuracy and completeness, and I agree with the above. Corinna Capra, DO

## 2022-07-28 ENCOUNTER — Ambulatory Visit: Payer: 59 | Admitting: Bariatrics

## 2022-08-02 ENCOUNTER — Other Ambulatory Visit (HOSPITAL_BASED_OUTPATIENT_CLINIC_OR_DEPARTMENT_OTHER): Payer: Self-pay

## 2022-08-02 ENCOUNTER — Encounter: Payer: Self-pay | Admitting: Family

## 2022-08-02 ENCOUNTER — Ambulatory Visit: Payer: 59 | Admitting: Bariatrics

## 2022-08-02 VITALS — BP 109/72 | HR 86 | Temp 98.0°F | Ht 61.0 in | Wt 160.0 lb

## 2022-08-02 DIAGNOSIS — E559 Vitamin D deficiency, unspecified: Secondary | ICD-10-CM

## 2022-08-02 DIAGNOSIS — E6609 Other obesity due to excess calories: Secondary | ICD-10-CM

## 2022-08-02 DIAGNOSIS — E88819 Insulin resistance, unspecified: Secondary | ICD-10-CM

## 2022-08-02 DIAGNOSIS — R110 Nausea: Secondary | ICD-10-CM

## 2022-08-02 DIAGNOSIS — R632 Polyphagia: Secondary | ICD-10-CM

## 2022-08-02 DIAGNOSIS — E669 Obesity, unspecified: Secondary | ICD-10-CM

## 2022-08-02 DIAGNOSIS — Z6830 Body mass index (BMI) 30.0-30.9, adult: Secondary | ICD-10-CM

## 2022-08-02 MED ORDER — TIRZEPATIDE 10 MG/0.5ML ~~LOC~~ SOAJ
10.0000 mg | SUBCUTANEOUS | 0 refills | Status: DC
  Filled 2022-08-02 – 2022-08-26 (×3): qty 2, 28d supply, fill #0

## 2022-08-02 MED ORDER — ONDANSETRON 4 MG PO TBDP
4.0000 mg | ORAL_TABLET | Freq: Three times a day (TID) | ORAL | 0 refills | Status: DC | PRN
  Filled 2022-08-02: qty 18, 6d supply, fill #0
  Filled 2022-08-26: qty 30, 10d supply, fill #0

## 2022-08-10 ENCOUNTER — Other Ambulatory Visit (HOSPITAL_BASED_OUTPATIENT_CLINIC_OR_DEPARTMENT_OTHER): Payer: Self-pay

## 2022-08-12 NOTE — Progress Notes (Signed)
Chief Complaint:   OBESITY Shelley Ramirez is here to discuss her progress with her obesity treatment plan along with follow-up of her obesity related diagnoses. Shelley Ramirez is on the Category 1 Plan and states she is following her eating plan approximately 0% of the time. Shelley Ramirez states she is not currently exercising.  Today's visit was #: 20 Starting weight: 205 lbs Starting date: 03/25/2021 Today's weight: 160 lbs Today's date: 08/02/2022 Total lbs lost to date: 45 Total lbs lost since last in-office visit: 1  Interim History: Shelley Ramirez is down 1 additional pound since her last visit. She is fasting per her church.  Subjective:   1. Vitamin D deficiency Shelley Ramirez is taking Ergocalciferol.  2. Insulin resistance Shelley Ramirez is taking Mounjaro.  3. Polyphagia Shelley Ramirez is taking Mounjaro.  4. Nausea without vomiting Shelley Ramirez is taking Zofran.  Assessment/Plan:   1. Vitamin D deficiency Continue prescription Vitamin D.   2. Insulin resistance Continue Mounjaro as prescribed.  Refill- tirzepatide (MOUNJARO) 10 MG/0.5ML Pen; Inject 10 mg into the skin once a week.  Dispense: 2 mL; Refill: 0  3. Polyphagia Continue Mounjaro as directed.  Refill- tirzepatide (MOUNJARO) 10 MG/0.5ML Pen; Inject 10 mg into the skin once a week.  Dispense: 2 mL; Refill: 0  4. Nausea without vomiting Continue Zofran as needed.  Refill- ondansetron (ZOFRAN-ODT) 4 MG disintegrating tablet; Take 1 tablet (4 mg total) by mouth every 8 (eight) hours as needed for nausea or vomiting.  Dispense: 30 tablet; Refill: 0  5. Obesity, current BMI 30.2 Shelley Ramirez is currently in the action stage of change. As such, her goal is to continue with weight loss efforts. She has agreed to the Category 1 Plan.   Meal planning Increase protein Increase fiber  Exercise goals:  As is  Behavioral modification strategies: increasing lean protein intake, decreasing simple carbohydrates, increasing vegetables, increasing water intake, decreasing  eating out, no skipping meals, meal planning and cooking strategies, keeping healthy foods in the home, and planning for success.  Shelley Ramirez has agreed to follow-up with our clinic in 4 weeks. She was informed of the importance of frequent follow-up visits to maximize her success with intensive lifestyle modifications for her multiple health conditions.   Objective:   Blood pressure 109/72, pulse 86, temperature 98 F (36.7 C), height 5\' 1"  (1.549 m), weight 160 lb (72.6 kg), SpO2 100 %, unknown if currently breastfeeding. Body mass index is 30.23 kg/m.  General: Cooperative, alert, well developed, in no acute distress. HEENT: Conjunctivae and lids unremarkable. Cardiovascular: Regular rhythm.  Lungs: Normal work of breathing. Neurologic: No focal deficits.   Lab Results  Component Value Date   CREATININE 0.85 04/19/2022   BUN 6 04/19/2022   NA 139 04/19/2022   K 4.1 04/19/2022   CL 102 04/19/2022   CO2 24 04/19/2022   Lab Results  Component Value Date   ALT 10 04/19/2022   AST 14 04/19/2022   ALKPHOS 59 04/19/2022   BILITOT 0.3 04/19/2022   Lab Results  Component Value Date   HGBA1C 5.0 04/19/2022   HGBA1C 5.2 10/07/2021   HGBA1C 5.5 03/25/2021   Lab Results  Component Value Date   INSULIN 17.8 04/19/2022   INSULIN 10.2 10/07/2021   INSULIN 8.5 03/25/2021   No results found for: "TSH" Lab Results  Component Value Date   CHOL 162 04/19/2022   HDL 48 04/19/2022   LDLCALC 95 04/19/2022   TRIG 107 04/19/2022   Lab Results  Component Value Date   VD25OH  22.0 (L) 04/19/2022   VD25OH 15.0 (L) 10/07/2021   VD25OH 10.5 (L) 03/25/2021   Lab Results  Component Value Date   WBC 6.1 10/13/2021   HGB 9.6 (L) 10/13/2021   HCT 37.3 04/19/2022   MCV 78.5 (L) 10/13/2021   PLT 519 (H) 10/13/2021   Lab Results  Component Value Date   IRON 49 04/19/2022   TIBC 265 04/19/2022   FERRITIN 73 04/19/2022   Attestation Statements:   Reviewed by clinician on day of visit:  allergies, medications, problem list, medical history, surgical history, family history, social history, and previous encounter notes.  I, Kathlene November, BS, CMA, am acting as transcriptionist for [employer], DO.  I have reviewed the above documentation for accuracy and completeness, and I agree with the above. Jearld Lesch, DO

## 2022-08-15 ENCOUNTER — Encounter: Payer: Self-pay | Admitting: Bariatrics

## 2022-08-22 NOTE — Telephone Encounter (Signed)
Sending another email 

## 2022-08-26 ENCOUNTER — Other Ambulatory Visit (HOSPITAL_BASED_OUTPATIENT_CLINIC_OR_DEPARTMENT_OTHER): Payer: Self-pay

## 2022-08-26 ENCOUNTER — Encounter: Payer: Self-pay | Admitting: Family

## 2022-08-30 ENCOUNTER — Encounter: Payer: Self-pay | Admitting: Bariatrics

## 2022-08-30 ENCOUNTER — Other Ambulatory Visit (HOSPITAL_BASED_OUTPATIENT_CLINIC_OR_DEPARTMENT_OTHER): Payer: Self-pay

## 2022-08-30 ENCOUNTER — Ambulatory Visit: Payer: 59 | Admitting: Bariatrics

## 2022-08-30 VITALS — BP 112/74 | HR 85 | Temp 97.9°F | Ht 61.0 in | Wt 161.0 lb

## 2022-08-30 DIAGNOSIS — R632 Polyphagia: Secondary | ICD-10-CM | POA: Diagnosis not present

## 2022-08-30 DIAGNOSIS — R110 Nausea: Secondary | ICD-10-CM

## 2022-08-30 DIAGNOSIS — E559 Vitamin D deficiency, unspecified: Secondary | ICD-10-CM

## 2022-08-30 DIAGNOSIS — Z6829 Body mass index (BMI) 29.0-29.9, adult: Secondary | ICD-10-CM | POA: Insufficient documentation

## 2022-08-30 DIAGNOSIS — Z6830 Body mass index (BMI) 30.0-30.9, adult: Secondary | ICD-10-CM | POA: Diagnosis not present

## 2022-08-30 DIAGNOSIS — E669 Obesity, unspecified: Secondary | ICD-10-CM | POA: Diagnosis not present

## 2022-08-30 DIAGNOSIS — E88819 Insulin resistance, unspecified: Secondary | ICD-10-CM

## 2022-08-30 MED ORDER — TIRZEPATIDE 10 MG/0.5ML ~~LOC~~ SOAJ
10.0000 mg | SUBCUTANEOUS | 0 refills | Status: DC
  Filled 2022-08-30: qty 2, 28d supply, fill #0

## 2022-08-30 MED ORDER — ONDANSETRON 4 MG PO TBDP
4.0000 mg | ORAL_TABLET | Freq: Three times a day (TID) | ORAL | 0 refills | Status: DC | PRN
  Filled 2022-08-30: qty 30, 10d supply, fill #0

## 2022-09-12 NOTE — Progress Notes (Signed)
Chief Complaint:   OBESITY Shelley Ramirez is here to discuss her progress with her obesity treatment plan along with follow-up of her obesity related diagnoses. Shelley Ramirez is on the Category 1 Plan and states she is following her eating plan approximately 95% of the time. Shelley Ramirez states she is not currently exercising.  Today's visit was #: 21 Starting weight: 205 lbs Starting date: 03/25/2021 Today's weight: 161 lbs Today's date: 08/30/2022 Total lbs lost to date: 44 Total lbs lost since last in-office visit: +1  Interim History: Shelley Ramirez is up 1 lbs since her last visit but has done very well overall. She has been very busy. She had been fasting and stopped 08/24/2022. Pt is up 1 lb of water. Her goal weight is 150 lbs.  Subjective:   1. Vitamin D deficiency Shelley Ramirez is taking high dose Vitamin D every 3 days.  2. Insulin resistance Shelley Ramirez is taking Mounjaro.  3. Polyphagia Mounjaro helps pt with appetite.  4. Nausea without vomiting Pt endorses symptoms occasionally with Mounjaro.  Assessment/Plan:   1. Vitamin D deficiency Continue Vitamin D.  2. Insulin resistance Increase exercise. Decrease all carbohydrates. Refill- tirzepatide (MOUNJARO) 10 MG/0.5ML Pen; Inject 10 mg into the skin once a week.  Dispense: 2 mL; Refill: 0  3. Polyphagia Continue Mounjaro. Refill- tirzepatide (MOUNJARO) 10 MG/0.5ML Pen; Inject 10 mg into the skin once a week.  Dispense: 2 mL; Refill: 0  4. Nausea without vomiting Prescription for Zofran as needed for nausea or vomiting.  - ondansetron (ZOFRAN-ODT) 4 MG disintegrating tablet; Take 1 tablet (4 mg total) by mouth every 8 (eight) hours as needed for nausea or vomiting.  Dispense: 30 tablet; Refill: 0  5. Generalized obesity 6. BMI 30.0-30.9,adult Meal planning Intentional eating  Shelley Ramirez is currently in the action stage of change. As such, her goal is to continue with weight loss efforts. She has agreed to the Category 1 Plan.   Exercise goals:  As  is  Behavioral modification strategies: increasing lean protein intake, decreasing simple carbohydrates, increasing vegetables, increasing water intake, decreasing eating out, no skipping meals, meal planning and cooking strategies, keeping healthy foods in the home, and planning for success.  Shelley Ramirez has agreed to follow-up with our clinic in 4 weeks. She was informed of the importance of frequent follow-up visits to maximize her success with intensive lifestyle modifications for her multiple health conditions.   Objective:   Blood pressure 112/74, pulse 85, temperature 97.9 F (36.6 C), height 5\' 1"  (1.549 m), weight 161 lb (73 kg), SpO2 100 %, unknown if currently breastfeeding. Body mass index is 30.42 kg/m.  General: Cooperative, alert, well developed, in no acute distress. HEENT: Conjunctivae and lids unremarkable. Cardiovascular: Regular rhythm.  Lungs: Normal work of breathing. Neurologic: No focal deficits.   Lab Results  Component Value Date   CREATININE 0.85 04/19/2022   BUN 6 04/19/2022   NA 139 04/19/2022   K 4.1 04/19/2022   CL 102 04/19/2022   CO2 24 04/19/2022   Lab Results  Component Value Date   ALT 10 04/19/2022   AST 14 04/19/2022   ALKPHOS 59 04/19/2022   BILITOT 0.3 04/19/2022   Lab Results  Component Value Date   HGBA1C 5.0 04/19/2022   HGBA1C 5.2 10/07/2021   HGBA1C 5.5 03/25/2021   Lab Results  Component Value Date   INSULIN 17.8 04/19/2022   INSULIN 10.2 10/07/2021   INSULIN 8.5 03/25/2021   No results found for: "TSH" Lab Results  Component Value  Date   CHOL 162 04/19/2022   HDL 48 04/19/2022   LDLCALC 95 04/19/2022   TRIG 107 04/19/2022   Lab Results  Component Value Date   VD25OH 22.0 (L) 04/19/2022   VD25OH 15.0 (L) 10/07/2021   VD25OH 10.5 (L) 03/25/2021   Lab Results  Component Value Date   WBC 6.1 10/13/2021   HGB 9.6 (L) 10/13/2021   HCT 37.3 04/19/2022   MCV 78.5 (L) 10/13/2021   PLT 519 (H) 10/13/2021   Lab Results   Component Value Date   IRON 49 04/19/2022   TIBC 265 04/19/2022   FERRITIN 73 04/19/2022   Attestation Statements:   Reviewed by clinician on day of visit: allergies, medications, problem list, medical history, surgical history, family history, social history, and previous encounter notes.  I, Kyung Rudd, BS, CMA, am acting as transcriptionist for [employer], DO.  I have reviewed the above documentation for accuracy and completeness, and I agree with the above. Corinna Capra, DO

## 2022-09-27 ENCOUNTER — Encounter: Payer: Self-pay | Admitting: Bariatrics

## 2022-09-27 ENCOUNTER — Other Ambulatory Visit (HOSPITAL_BASED_OUTPATIENT_CLINIC_OR_DEPARTMENT_OTHER): Payer: Self-pay

## 2022-09-27 ENCOUNTER — Encounter: Payer: Self-pay | Admitting: Family

## 2022-09-27 ENCOUNTER — Ambulatory Visit: Payer: 59 | Admitting: Bariatrics

## 2022-09-27 VITALS — BP 114/80 | HR 101 | Temp 97.8°F | Ht 61.0 in | Wt 158.0 lb

## 2022-09-27 DIAGNOSIS — Z6830 Body mass index (BMI) 30.0-30.9, adult: Secondary | ICD-10-CM

## 2022-09-27 DIAGNOSIS — G4709 Other insomnia: Secondary | ICD-10-CM

## 2022-09-27 DIAGNOSIS — G4700 Insomnia, unspecified: Secondary | ICD-10-CM | POA: Insufficient documentation

## 2022-09-27 DIAGNOSIS — E88819 Insulin resistance, unspecified: Secondary | ICD-10-CM | POA: Diagnosis not present

## 2022-09-27 DIAGNOSIS — E669 Obesity, unspecified: Secondary | ICD-10-CM

## 2022-09-27 DIAGNOSIS — R632 Polyphagia: Secondary | ICD-10-CM

## 2022-09-27 DIAGNOSIS — E559 Vitamin D deficiency, unspecified: Secondary | ICD-10-CM

## 2022-09-27 DIAGNOSIS — R110 Nausea: Secondary | ICD-10-CM

## 2022-09-27 MED ORDER — ONDANSETRON 4 MG PO TBDP
4.0000 mg | ORAL_TABLET | Freq: Three times a day (TID) | ORAL | 0 refills | Status: DC | PRN
  Filled 2022-09-27: qty 18, 21d supply, fill #0
  Filled 2022-12-12: qty 18, 21d supply, fill #1
  Filled 2022-12-13 – 2022-12-22 (×2): qty 12, 4d supply, fill #1

## 2022-09-27 MED ORDER — AMITRIPTYLINE HCL 25 MG PO TABS
25.0000 mg | ORAL_TABLET | Freq: Every evening | ORAL | 0 refills | Status: DC | PRN
  Filled 2022-09-27: qty 30, 30d supply, fill #0

## 2022-09-27 MED ORDER — VITAMIN D (ERGOCALCIFEROL) 1.25 MG (50000 UNIT) PO CAPS
50000.0000 [IU] | ORAL_CAPSULE | ORAL | 0 refills | Status: DC
  Filled 2022-09-27: qty 8, 24d supply, fill #0

## 2022-09-27 MED ORDER — TIRZEPATIDE 10 MG/0.5ML ~~LOC~~ SOAJ
10.0000 mg | SUBCUTANEOUS | 0 refills | Status: DC
  Filled 2022-09-27: qty 2, 28d supply, fill #0
  Filled 2022-10-30 – 2022-11-25 (×2): qty 2, 28d supply, fill #1
  Filled 2022-12-12 – 2022-12-13 (×2): qty 2, 28d supply, fill #2

## 2022-09-29 ENCOUNTER — Telehealth: Payer: Self-pay

## 2022-09-29 NOTE — Telephone Encounter (Signed)
Started prior auth for Mounjaro '10MG'$ 

## 2022-10-03 ENCOUNTER — Telehealth (INDEPENDENT_AMBULATORY_CARE_PROVIDER_SITE_OTHER): Payer: Self-pay

## 2022-10-03 ENCOUNTER — Encounter: Payer: Self-pay | Admitting: Family

## 2022-10-03 ENCOUNTER — Other Ambulatory Visit (HOSPITAL_BASED_OUTPATIENT_CLINIC_OR_DEPARTMENT_OTHER): Payer: Self-pay

## 2022-10-03 NOTE — Telephone Encounter (Signed)
Can you please advise?

## 2022-10-03 NOTE — Telephone Encounter (Signed)
Mounjaro denied per insurance due to patient needing to try Victoza and Trulicity before starting Mounjaro. Notified patient via [place]

## 2022-10-03 NOTE — Telephone Encounter (Signed)
PA started for [facility] 10/03/22

## 2022-10-04 ENCOUNTER — Other Ambulatory Visit (INDEPENDENT_AMBULATORY_CARE_PROVIDER_SITE_OTHER): Payer: Self-pay | Admitting: Bariatrics

## 2022-10-04 MED ORDER — SUMATRIPTAN SUCCINATE 50 MG PO TABS: ORAL_TABLET | ORAL | 0 refills | Status: DC

## 2022-10-05 NOTE — Progress Notes (Unsigned)
Chief Complaint:   OBESITY Shelley Ramirez is here to discuss her progress with her obesity treatment plan along with follow-up of her obesity related diagnoses. Shelley Ramirez is on the Category 1 plan and states she is following her eating plan approximately 90% of the time. Shelley Ramirez states she has not been exercising.  Today's visit was #: 22 Starting weight: 205 lbs Starting date: 03/25/2021 Today's weight: 158 lbs Today's date: 09/27/2022 Total lbs lost to date: 47 Total lbs lost since last in-office visit: -3  Interim History: She is down 3 pounds since her last visit.  Her goal weight is 150 pounds.  Subjective:   1. Other insomnia Had taken amitriptyline last year with good results.  2. Insulin resistance Taking Mounjaro.  3. Polyphagia Taking Mounjaro.  4. Nausea without vomiting Nausea and vomiting due to [facility].  5. Vitamin D deficiency Taking as directed.   Assessment/Plan:   1. Other insomnia 1.  Handout-7 Strategies for *** Sleep 2.  New prescription- - amitriptyline (ELAVIL) 25 MG tablet; Take 1 tablet (25 mg total) by mouth at bedtime as needed for sleep.  Dispense: 30 tablet; Refill: 0  2. Insulin resistance 1.  Continue Mounjaro. - tirzepatide (MOUNJARO) 10 MG/0.5ML Pen; Inject 10 mg into the skin once a week.  Dispense: 6 mL; Refill: 0 2.  Keep all carbohydrates low.  3. Polyphagia 1.  Refill- - tirzepatide (MOUNJARO) 10 MG/0.5ML Pen; Inject 10 mg into the skin once a week.  Dispense: 6 mL; Refill: 0  4. Nausea without vomiting Refill- - ondansetron (ZOFRAN-ODT) 4 MG disintegrating tablet; Take 1 tablet (4 mg total) by mouth every 8 (eight) hours as needed for nausea or vomiting.  Dispense: 30 tablet; Refill: 0  5. Vitamin D deficiency Refill- - Vitamin D, Ergocalciferol, (DRISDOL) 1.25 MG (50000 UNIT) CAPS capsule; Take 1 capsule (50,000 Units total) by mouth every 3 (three) days.  Dispense: 8 capsule; Refill: 0  6. Generalized obesity BMI  30.0-30.9,adult 1.  Meal planning. 2.  Intentional eating. 3.  Sleep hygiene.  Shelley Ramirez is currently in the action stage of change. As such, her goal is to continue with weight loss efforts. She has agreed to the Category 1 plan.  Exercise goals: All adults should avoid inactivity. Some physical activity is better than none, and adults who participate in any amount of physical activity gain some health benefits.  Behavioral modification strategies: increasing lean protein intake, decreasing simple carbohydrates, increasing vegetables, increasing water intake, decreasing eating out, no skipping meals, meal planning and cooking strategies, keeping healthy foods in the home, and planning for success.  Shelley Ramirez has agreed to follow-up with our clinic in 4 weeks. She was informed of the importance of frequent follow-up visits to maximize her success with intensive lifestyle modifications for her multiple health conditions.   Objective:   Blood pressure 114/80, pulse (!) 101, temperature 97.8 F (36.6 C), height '5\' 1"'$  (1.549 m), weight 158 lb (71.7 kg), SpO2 97 %, unknown if currently breastfeeding. Body mass index is 29.85 kg/m.  General: Cooperative, alert, well developed, in no acute distress. HEENT: Conjunctivae and lids unremarkable. Cardiovascular: Regular rhythm.  Lungs: Normal work of breathing. Neurologic: No focal deficits.   Lab Results  Component Value Date   CREATININE 0.85 04/19/2022   BUN 6 04/19/2022   NA 139 04/19/2022   K 4.1 04/19/2022   CL 102 04/19/2022   CO2 24 04/19/2022   Lab Results  Component Value Date   ALT 10 04/19/2022  AST 14 04/19/2022   ALKPHOS 59 04/19/2022   BILITOT 0.3 04/19/2022   Lab Results  Component Value Date   HGBA1C 5.0 04/19/2022   HGBA1C 5.2 10/07/2021   HGBA1C 5.5 03/25/2021   Lab Results  Component Value Date   INSULIN 17.8 04/19/2022   INSULIN 10.2 10/07/2021   INSULIN 8.5 03/25/2021   No results found for: "TSH" Lab Results   Component Value Date   CHOL 162 04/19/2022   HDL 48 04/19/2022   LDLCALC 95 04/19/2022   TRIG 107 04/19/2022   Lab Results  Component Value Date   VD25OH 22.0 (L) 04/19/2022   VD25OH 15.0 (L) 10/07/2021   VD25OH 10.5 (L) 03/25/2021   Lab Results  Component Value Date   WBC 6.1 10/13/2021   HGB 9.6 (L) 10/13/2021   HCT 37.3 04/19/2022   MCV 78.5 (L) 10/13/2021   PLT 519 (H) 10/13/2021   Lab Results  Component Value Date   IRON 49 04/19/2022   TIBC 265 04/19/2022   FERRITIN 73 04/19/2022    Attestation Statements:   Reviewed by clinician on day of visit: allergies, medications, problem list, medical history, surgical history, family history, social history, and previous encounter notes.  I, Dawn Whitmire, FNP-C, am acting as transcriptionist for Dr. Jearld Lesch.  I have reviewed the above documentation for accuracy and completeness, and I agree with the above. -  ***

## 2022-10-27 ENCOUNTER — Ambulatory Visit: Payer: 59 | Admitting: Bariatrics

## 2022-10-31 ENCOUNTER — Other Ambulatory Visit (HOSPITAL_BASED_OUTPATIENT_CLINIC_OR_DEPARTMENT_OTHER): Payer: Self-pay

## 2022-10-31 ENCOUNTER — Other Ambulatory Visit: Payer: Self-pay

## 2022-11-08 ENCOUNTER — Other Ambulatory Visit (HOSPITAL_BASED_OUTPATIENT_CLINIC_OR_DEPARTMENT_OTHER): Payer: Self-pay

## 2022-11-09 ENCOUNTER — Ambulatory Visit: Payer: 59 | Admitting: Bariatrics

## 2022-11-25 ENCOUNTER — Other Ambulatory Visit (HOSPITAL_BASED_OUTPATIENT_CLINIC_OR_DEPARTMENT_OTHER): Payer: Self-pay

## 2022-11-30 ENCOUNTER — Other Ambulatory Visit (HOSPITAL_BASED_OUTPATIENT_CLINIC_OR_DEPARTMENT_OTHER): Payer: Self-pay

## 2022-11-30 DIAGNOSIS — L700 Acne vulgaris: Secondary | ICD-10-CM | POA: Diagnosis not present

## 2022-11-30 DIAGNOSIS — L680 Hirsutism: Secondary | ICD-10-CM | POA: Diagnosis not present

## 2022-11-30 DIAGNOSIS — L810 Postinflammatory hyperpigmentation: Secondary | ICD-10-CM | POA: Diagnosis not present

## 2022-11-30 MED ORDER — CLINDAMYCIN PHOS-BENZOYL PEROX 1.2-5 % EX GEL
CUTANEOUS | 11 refills | Status: AC
  Filled 2022-11-30: qty 45, 30d supply, fill #0

## 2022-12-01 ENCOUNTER — Other Ambulatory Visit (HOSPITAL_BASED_OUTPATIENT_CLINIC_OR_DEPARTMENT_OTHER): Payer: Self-pay

## 2022-12-01 MED ORDER — TRETINOIN 0.025 % EX CREA
TOPICAL_CREAM | CUTANEOUS | 3 refills | Status: AC
  Filled 2022-12-01: qty 45, 30d supply, fill #0

## 2022-12-02 ENCOUNTER — Other Ambulatory Visit (HOSPITAL_BASED_OUTPATIENT_CLINIC_OR_DEPARTMENT_OTHER): Payer: Self-pay

## 2022-12-02 ENCOUNTER — Other Ambulatory Visit: Payer: Self-pay

## 2022-12-05 ENCOUNTER — Other Ambulatory Visit (HOSPITAL_BASED_OUTPATIENT_CLINIC_OR_DEPARTMENT_OTHER): Payer: Self-pay

## 2022-12-06 ENCOUNTER — Other Ambulatory Visit (HOSPITAL_BASED_OUTPATIENT_CLINIC_OR_DEPARTMENT_OTHER): Payer: Self-pay

## 2022-12-13 ENCOUNTER — Other Ambulatory Visit (HOSPITAL_BASED_OUTPATIENT_CLINIC_OR_DEPARTMENT_OTHER): Payer: Self-pay

## 2022-12-13 ENCOUNTER — Other Ambulatory Visit: Payer: Self-pay

## 2022-12-13 ENCOUNTER — Other Ambulatory Visit: Payer: Self-pay | Admitting: Bariatrics

## 2022-12-13 DIAGNOSIS — R110 Nausea: Secondary | ICD-10-CM

## 2022-12-14 ENCOUNTER — Other Ambulatory Visit (HOSPITAL_BASED_OUTPATIENT_CLINIC_OR_DEPARTMENT_OTHER): Payer: Self-pay

## 2022-12-14 ENCOUNTER — Encounter: Payer: Self-pay | Admitting: Bariatrics

## 2022-12-14 ENCOUNTER — Ambulatory Visit: Payer: 59 | Admitting: Bariatrics

## 2022-12-14 VITALS — BP 101/62 | HR 85 | Temp 97.7°F | Ht 61.0 in | Wt 160.0 lb

## 2022-12-14 DIAGNOSIS — D509 Iron deficiency anemia, unspecified: Secondary | ICD-10-CM

## 2022-12-14 DIAGNOSIS — D508 Other iron deficiency anemias: Secondary | ICD-10-CM

## 2022-12-14 DIAGNOSIS — E88819 Insulin resistance, unspecified: Secondary | ICD-10-CM

## 2022-12-14 DIAGNOSIS — R632 Polyphagia: Secondary | ICD-10-CM | POA: Diagnosis not present

## 2022-12-14 DIAGNOSIS — E559 Vitamin D deficiency, unspecified: Secondary | ICD-10-CM

## 2022-12-14 DIAGNOSIS — Z6830 Body mass index (BMI) 30.0-30.9, adult: Secondary | ICD-10-CM

## 2022-12-14 DIAGNOSIS — E669 Obesity, unspecified: Secondary | ICD-10-CM

## 2022-12-14 MED ORDER — TIRZEPATIDE 12.5 MG/0.5ML ~~LOC~~ SOAJ
12.5000 mg | SUBCUTANEOUS | 0 refills | Status: DC
Start: 2022-12-14 — End: 2023-02-08
  Filled 2022-12-14 – 2022-12-21 (×2): qty 2, 28d supply, fill #0

## 2022-12-14 MED ORDER — VITAMIN D (ERGOCALCIFEROL) 1.25 MG (50000 UNIT) PO CAPS
50000.0000 [IU] | ORAL_CAPSULE | ORAL | 0 refills | Status: DC
Start: 2022-12-14 — End: 2023-03-08
  Filled 2022-12-14 – 2022-12-22 (×2): qty 8, 24d supply, fill #0

## 2022-12-14 NOTE — Progress Notes (Addendum)
WEIGHT SUMMARY AND BIOMETRICS  Weight Gained Since Last Visit: 2lb   Vitals Temp: 97.7 F (36.5 C) BP: 101/62 Pulse Rate: 85 SpO2: 100 %   Anthropometric Measurements Height: 5\' 1"  (1.549 m) Weight: 160 lb (72.6 kg) BMI (Calculated): 30.25 Weight at Last Visit: 158lb Weight Lost Since Last Visit: 0 Weight Gained Since Last Visit: 2lb Starting Weight: 205lb Total Weight Loss (lbs): 45 lb (20.4 kg)   Body Composition  Body Fat %: 34.3 % Fat Mass (lbs): 55 lbs Muscle Mass (lbs): 100.2 lbs Total Body Water (lbs): 68 lbs Visceral Fat Rating : 7   Other Clinical Data Fasting: yes Labs: no Today's Visit #: 23 Starting Date: 03/25/21    OBESITY Shelley Ramirez is here to discuss her progress with her obesity treatment plan along with follow-up of her obesity related diagnoses.     Nutrition Plan: the Category 1 plan - 90% adherence.  Current exercise: none  Interim History:  She is up 2 lbs since her last visit  Protein intake is as prescribed and Meeting protein goals.  Pharmacotherapy: Shelley Ramirez is on Mounjaro 10 mg SQ weekly Adverse side effects: None Hunger is moderately controlled.  Cravings are well controlled.  Assessment/Plan:   1. Vitamin D deficiency  Vitamin D is not at goal of 50.  Most recent vitamin D level was 22.0. She is on  prescription ergocalciferol 50,000 IU weekly. Lab Results  Component Value Date   VD25OH 22.0 (L) 04/19/2022   VD25OH 15.0 (L) 10/07/2021   VD25OH 10.5 (L) 03/25/2021    Plan: Refill prescription vitamin D 50,000 IU weekly.   2. Insulin resistance Insulin Resistance Shelley Ramirez has had elevated fasting insulin readings. Goal is HgbA1c < 5.7, fasting insulin at l0 or less, and preferably at 5.  She reports mild  polyphagia, more than previously  Medication(s): Mounjaro Lab Results  Component Value Date   HGBA1C 5.0 04/19/2022   Lab Results  Component Value Date   INSULIN 17.8 04/19/2022   INSULIN 10.2  10/07/2021   INSULIN 8.5 03/25/2021    Plan Medication(s): Mounjaro 10 mg into the skin weekly  Will work on the agreed upon plan. Will minimize refined carbohydrates ( sweets and starches), and focus more on complex carbohydrates.  Increase the micronutrients found in leafy greens, which include magnesium, polyphenols, and vitamin C which have been postulated to help with insulin sensitivity. Minimize "fast food" and cook more meals at home.  Increase fiber to 25 to 30 grams daily.   3. Polyphagia Shelley Ramirez endorses excessive hunger.  Medication(s): Mounjaro  Effects of medication: moderately  well controlled. Cravings are well controlled.   Plan: Medication(s): Mounjaro 12.5 mg SQ weekly Will increase water, protein and fiber to help assuage hunger.  Will minimize foods that have a high glucose index/load to minimize reactive hypoglycemia.  Labs done today (CMP, HgbA1c, insulin, vitamin D, anemia panel, and ferritin. ).    4. Iron deficiency anemia:     Generalized Obesity: Current BMI BMI (Calculated): 30.25   Pharmacotherapy Plan Continue and increase dose  Mounjaro 12.5 mg SQ weekly  Shelley Ramirez is currently in the action stage of change. As such, her goal is to continue with weight loss efforts.  She has agreed to the Category 1 plan.  Exercise goals: For substantial health benefits, adults should do at least 150 minutes (2 hours and 30 minutes) a week of moderate-intensity, or 75 minutes (1 hour and 15 minutes) a week of vigorous-intensity aerobic physical activity, or an  equivalent combination of moderate- and vigorous-intensity aerobic activity. Aerobic activity should be performed in episodes of at least 10 minutes, and preferably, it should be spread throughout the week.  Behavioral modification strategies: increasing lean protein intake, decreasing simple carbohydrates , no meal skipping, and meal planning .  Shelley Ramirez has agreed to follow-up with our clinic in 4 weeks.        Objective:   VITALS: Per patient if applicable, see vitals. GENERAL: Alert and in no acute distress. CARDIOPULMONARY: No increased WOB. Speaking in clear sentences.  PSYCH: Pleasant and cooperative. Speech normal rate and rhythm. Affect is appropriate. Insight and judgement are appropriate. Attention is focused, linear, and appropriate.  NEURO: Oriented as arrived to appointment on time with no prompting.   Attestation Statements:    This was prepared with the assistance of Engineer, civil (consulting).  Occasional wrong-word or sound-a-like substitutions may have occurred due to the inherent limitations of voice recognition software.   Corinna Capra, DO

## 2022-12-15 LAB — COMPREHENSIVE METABOLIC PANEL
ALT: 11 IU/L (ref 0–32)
AST: 16 IU/L (ref 0–40)
Albumin/Globulin Ratio: 1.4 (ref 1.2–2.2)
Albumin: 4 g/dL (ref 3.9–4.9)
Alkaline Phosphatase: 45 IU/L (ref 44–121)
BUN/Creatinine Ratio: 9 (ref 9–23)
BUN: 8 mg/dL (ref 6–24)
Bilirubin Total: 0.5 mg/dL (ref 0.0–1.2)
CO2: 23 mmol/L (ref 20–29)
Calcium: 9.1 mg/dL (ref 8.7–10.2)
Chloride: 104 mmol/L (ref 96–106)
Creatinine, Ser: 0.92 mg/dL (ref 0.57–1.00)
Globulin, Total: 2.8 g/dL (ref 1.5–4.5)
Glucose: 74 mg/dL (ref 70–99)
Potassium: 4.6 mmol/L (ref 3.5–5.2)
Sodium: 137 mmol/L (ref 134–144)
Total Protein: 6.8 g/dL (ref 6.0–8.5)
eGFR: 80 mL/min/{1.73_m2} (ref >59)

## 2022-12-15 LAB — ANEMIA PANEL
Ferritin: 10 ng/mL — ABNORMAL LOW (ref 15–150)
Folate, Hemolysate: 228 ng/mL
Folate, RBC: 659 ng/mL (ref >498)
Hematocrit: 34.6 % (ref 34.0–46.6)
Iron Saturation: 13 % — ABNORMAL LOW (ref 15–55)
Iron: 44 ug/dL (ref 27–159)
Retic Ct Pct: 1.3 % (ref 0.6–2.6)
Total Iron Binding Capacity: 351 ug/dL (ref 250–450)
UIBC: 307 ug/dL (ref 131–425)
Vitamin B-12: 241 pg/mL (ref 232–1245)

## 2022-12-15 LAB — HEMOGLOBIN A1C
Est. average glucose Bld gHb Est-mCnc: 100 mg/dL
Hgb A1c MFr Bld: 5.1 % (ref 4.8–5.6)

## 2022-12-15 LAB — VITAMIN D 25 HYDROXY (VIT D DEFICIENCY, FRACTURES): Vit D, 25-Hydroxy: 27.6 ng/mL — ABNORMAL LOW (ref 30.0–100.0)

## 2022-12-15 LAB — INSULIN, RANDOM: INSULIN: 6.5 u[IU]/mL (ref 2.6–24.9)

## 2022-12-21 ENCOUNTER — Other Ambulatory Visit (HOSPITAL_BASED_OUTPATIENT_CLINIC_OR_DEPARTMENT_OTHER): Payer: Self-pay

## 2022-12-22 ENCOUNTER — Other Ambulatory Visit (HOSPITAL_BASED_OUTPATIENT_CLINIC_OR_DEPARTMENT_OTHER): Payer: Self-pay

## 2023-01-11 ENCOUNTER — Ambulatory Visit: Payer: 59 | Admitting: Bariatrics

## 2023-01-11 ENCOUNTER — Encounter: Payer: Self-pay | Admitting: Bariatrics

## 2023-01-11 ENCOUNTER — Other Ambulatory Visit (HOSPITAL_BASED_OUTPATIENT_CLINIC_OR_DEPARTMENT_OTHER): Payer: Self-pay

## 2023-01-11 VITALS — BP 121/70 | HR 85 | Temp 97.9°F | Ht 61.0 in | Wt 156.0 lb

## 2023-01-11 DIAGNOSIS — R632 Polyphagia: Secondary | ICD-10-CM

## 2023-01-11 DIAGNOSIS — E669 Obesity, unspecified: Secondary | ICD-10-CM | POA: Diagnosis not present

## 2023-01-11 DIAGNOSIS — E88819 Insulin resistance, unspecified: Secondary | ICD-10-CM

## 2023-01-11 DIAGNOSIS — R110 Nausea: Secondary | ICD-10-CM

## 2023-01-11 DIAGNOSIS — D508 Other iron deficiency anemias: Secondary | ICD-10-CM | POA: Diagnosis not present

## 2023-01-11 DIAGNOSIS — Z6829 Body mass index (BMI) 29.0-29.9, adult: Secondary | ICD-10-CM | POA: Diagnosis not present

## 2023-01-11 MED ORDER — ONDANSETRON 4 MG PO TBDP
4.0000 mg | ORAL_TABLET | Freq: Three times a day (TID) | ORAL | 0 refills | Status: DC | PRN
Start: 2023-01-11 — End: 2023-03-08
  Filled 2023-01-11: qty 6, 2d supply, fill #0
  Filled 2023-02-10: qty 6, 2d supply, fill #1

## 2023-01-11 MED ORDER — TIRZEPATIDE 10 MG/0.5ML ~~LOC~~ SOAJ
10.0000 mg | SUBCUTANEOUS | 0 refills | Status: DC
Start: 2023-01-11 — End: 2023-02-08
  Filled 2023-01-11: qty 2, 28d supply, fill #0

## 2023-01-11 NOTE — Progress Notes (Signed)
WEIGHT SUMMARY AND BIOMETRICS  Weight Lost Since Last Visit: 4lb   Vitals Temp: 97.9 F (36.6 C) BP: 121/70 Pulse Rate: 85 SpO2: 100 %   Anthropometric Measurements Height: 5\' 1"  (1.549 m) Weight: 156 lb (70.8 kg) BMI (Calculated): 29.49 Weight at Last Visit: 160lb Weight Lost Since Last Visit: 4lb Starting Weight: 205lb Total Weight Loss (lbs): 49 lb (22.2 kg)   Body Composition  Body Fat %: 34.5 % Fat Mass (lbs): 54 lbs Muscle Mass (lbs): 97.4 lbs Total Body Water (lbs): 68 lbs Visceral Fat Rating : 7   Other Clinical Data Fasting: no Labs: no Today's Visit #: 24 Starting Date: 03/25/21    OBESITY Shelley Ramirez is here to discuss her progress with her obesity treatment plan along with follow-up of her obesity related diagnoses.     Nutrition Plan: the Category 1 plan - 75% adherence.  Current exercise: none  Interim History:  She is down an additional 4 lbs since her last visit.  Eating all of the food on the plan., Is not skipping meals, and Water intake is adequate.  Pharmacotherapy: Shelley Ramirez is on Mounjaro 10 mg SQ weekly Adverse side effects: None Hunger is moderately controlled.  Cravings are moderately controlled.  Assessment/Plan:   1. Insulin resistance Insulin Resistance Shelley Ramirez has had elevated fasting insulin readings. Goal is HgbA1c < 5.7, fasting insulin at l0 or less, and preferably at 5.  She denies polyphagia. Medication(s): Mounjaro Lab Results  Component Value Date   HGBA1C 5.1 12/14/2022   Lab Results  Component Value Date   INSULIN 6.5 12/14/2022   INSULIN 17.8 04/19/2022   INSULIN 10.2 10/07/2021   INSULIN 8.5 03/25/2021    Plan Medication(s): Mounjaro 10 mg SQ weekly Will work on the agreed upon plan. Will minimize refined carbohydrates ( sweets and starches), and focus more on complex carbohydrates.  Increase the micronutrients found in leafy greens, which include magnesium, polyphenols, and vitamin C which have  been postulated to help with insulin sensitivity. Minimize "fast food" and cook more meals at home.  Increase fiber to 25 to 30 grams daily.  Information sheet on " Insulin Resistance and Prediabetes".    2. Polyphagia Polyphagia Shelley Ramirez endorses excessive hunger.  Medication(s): Mounjaro Effects of medication:  moderately controlled. Cravings are moderately controlled.   Plan: Medication(s): Mounjaro 10 mg SQ weekly (decrease from 12 mg to 10 mg).  Will increase water, protein and fiber to help assuage hunger.  Will minimize foods that have a high glucose index/load to minimize reactive hypoglycemia.   3. Nausea without vomiting  She is doing well with the [facility], but occasionally has nausea.   Plan: Rx for Zofran 4 mg every 8 hours as needed # 30 with no refills.   4. Iron deficiency anemia:   Her last labs showed a low ferritin and iron saturation level.  She has a history of iron infusions and is feeling fatigued.   Plan: She will make an appointment for an iron infusion and will continue taking oral iron.  Labs reviewed today (CMP,  HgbA1c, insulin, vitamin D, B 12, and anemia panel/Hct).    Generalized Obesity: Current BMI BMI (Calculated): 29.49   Pharmacotherapy Plan Continue and refill  Mounjaro 10 mg SQ weekly  Shelley Ramirez is currently in the action stage of change. As such, her goal is to continue with weight loss efforts.  She has agreed to the Category 1 plan.  Exercise goals: For substantial health benefits, adults should do at least 150  minutes (2 hours and 30 minutes) a week of moderate-intensity, or 75 minutes (1 hour and 15 minutes) a week of vigorous-intensity aerobic physical activity, or an equivalent combination of moderate- and vigorous-intensity aerobic activity. Aerobic activity should be performed in episodes of at least 10 minutes, and preferably, it should be spread throughout the week.  Behavioral modification strategies: increasing lean protein intake,  no meal skipping, meal planning , and planning for success.  Shelley Ramirez has agreed to follow-up with our clinic in 4 weeks.       Objective:   VITALS: Per patient if applicable, see vitals. GENERAL: Alert and in no acute distress. CARDIOPULMONARY: No increased WOB. Speaking in clear sentences.  PSYCH: Pleasant and cooperative. Speech normal rate and rhythm. Affect is appropriate. Insight and judgement are appropriate. Attention is focused, linear, and appropriate.  NEURO: Oriented as arrived to appointment on time with no prompting.   Attestation Statements:    This was prepared with the assistance of Engineer, civil (consulting).  Occasional wrong-word or sound-a-like substitutions may have occurred due to the inherent limitations of voice recognition software.   Corinna Capra, DO

## 2023-01-12 ENCOUNTER — Other Ambulatory Visit: Payer: Self-pay

## 2023-01-12 DIAGNOSIS — D508 Other iron deficiency anemias: Secondary | ICD-10-CM

## 2023-01-16 ENCOUNTER — Other Ambulatory Visit: Payer: Self-pay | Admitting: Family

## 2023-01-16 DIAGNOSIS — D500 Iron deficiency anemia secondary to blood loss (chronic): Secondary | ICD-10-CM

## 2023-01-17 ENCOUNTER — Inpatient Hospital Stay: Payer: 59 | Attending: Family | Admitting: Family

## 2023-01-17 ENCOUNTER — Inpatient Hospital Stay: Payer: 59

## 2023-01-17 ENCOUNTER — Encounter: Payer: Self-pay | Admitting: Family

## 2023-01-17 VITALS — BP 102/76 | HR 80 | Temp 98.1°F | Resp 17 | Wt 163.8 lb

## 2023-01-17 DIAGNOSIS — N920 Excessive and frequent menstruation with regular cycle: Secondary | ICD-10-CM | POA: Diagnosis not present

## 2023-01-17 DIAGNOSIS — D500 Iron deficiency anemia secondary to blood loss (chronic): Secondary | ICD-10-CM | POA: Insufficient documentation

## 2023-01-17 NOTE — Progress Notes (Signed)
Hematology and Oncology Follow Up Visit  Shelley Ramirez [id number] 1980/11/30 42 y.o. 01/17/2023   Principle Diagnosis:  Iron deficiency anemia secondary to heavy cycles  Current Therapy:   IV iron as indicated    Interim History:  Shelley Ramirez is here today for follow-up. She is symptomatic with fatigue, dizziness, mild SOB with exertion and palpitations.  She had labs drawn with her PCP and iron saturation is low 13% and ferritin 10.  He cycle has been regular with heavy flow. She also notices large clots.  No other blood loss noted. No abnormal bruising, no petechiae.  No fever, chills, n/v, cough, rash, chest pain, abdominal pain or changes in bowel or bladder habits.  No swelling, tenderness, numbness or tingling in her extremities at this time.  No falls or syncope.  Appetite and hydration are good. Weight is stable 163 lbs.   ECOG Performance Status: 1 - Symptomatic but completely ambulatory  Medications:  Allergies as of 01/17/2023       Reactions   Codeine Shortness Of Breath, Other (See Comments)   Hyperventilation, pass out, loss off consciousness     Clindamycin Nausea And Vomiting   Penicillin G Rash        Medication List        Accurate as of January 17, 2023  8:59 AM. If you have any questions, ask your nurse or doctor.          Aimovig 140 MG/ML Soaj Generic drug: Erenumab-aooe Inject 140 mg into the skin every 28 (twenty-eight) days.   amitriptyline 25 MG tablet Commonly known as: ELAVIL Take 1 tablet (25 mg total) by mouth at bedtime as needed for sleep.   Clindamycin-Benzoyl Per (Refr) gel Apply to face every other morning   EPINEPHrine 0.3 mg/0.3 mL Soaj injection Commonly known as: EpiPen 2-Pak Inject 0.3 mg into the muscle as needed for anaphylaxis. Use in event of severe allergic reaction (throat closing, unable to breath, about to pass out or faint).   Mounjaro 12.5 MG/0.5ML Pen Generic drug: tirzepatide Inject 12.5 mg into  the skin once a week.   tirzepatide 10 MG/0.5ML Pen Commonly known as: MOUNJARO Inject 10 mg into the skin once a week.   ondansetron 4 MG disintegrating tablet Commonly known as: ZOFRAN-ODT Take 1 tablet (4 mg total) by mouth every 8 (eight) hours as needed for nausea or vomiting.   SUMAtriptan 50 MG tablet Commonly known as: IMITREX Take 1 tablet by mouth at onset of migraine. May repeat in 2 hours if headache persists; limit 4 tablets in 24 hours by mouth.   tretinoin 0.025 % cream Commonly known as: RETIN-A Apply 1/2 dime sized amount on cheeks and chin nightly to tolerance.   Vitamin D (Ergocalciferol) 1.25 MG (50000 UNIT) Caps capsule Commonly known as: DRISDOL Take 1 capsule (50,000 Units total) by mouth every 3 (three) days.        Allergies:  Allergies  Allergen Reactions   Codeine Shortness Of Breath and Other (See Comments)    Hyperventilation, pass out, loss off consciousness     Clindamycin Nausea And Vomiting   Penicillin G Rash    Past Medical History, Surgical history, Social history, and Family History were reviewed and updated.  Review of Systems: All other 10 point review of systems is negative.   Physical Exam:  weight is 163 lb 12.8 oz (74.3 kg). Her oral temperature is 98.1 F (36.7 C). Her blood pressure is 102/76 and her pulse is  80. Her respiration is 17 and oxygen saturation is 100%.   Wt Readings from Last 3 Encounters:  01/17/23 163 lb 12.8 oz (74.3 kg)  01/11/23 156 lb (70.8 kg)  12/14/22 160 lb (72.6 kg)    Ocular: Sclerae unicteric, pupils equal, round and reactive to light Ear-nose-throat: Oropharynx clear, dentition fair Lymphatic: No cervical or supraclavicular adenopathy Lungs no rales or rhonchi, good excursion bilaterally Heart regular rate and rhythm, no murmur appreciated Abd soft, nontender, positive bowel sounds MSK no focal spinal tenderness, no joint edema Neuro: non-focal, well-oriented, appropriate affect Breasts:  Deferred   Lab Results  Component Value Date   WBC 6.1 10/13/2021   HGB 9.6 (L) 10/13/2021   HCT 34.6 12/14/2022   MCV 78.5 (L) 10/13/2021   PLT 519 (H) 10/13/2021   Lab Results  Component Value Date   FERRITIN 10 (L) 12/14/2022   IRON 44 12/14/2022   TIBC 351 12/14/2022   UIBC 307 12/14/2022   IRONPCTSAT 13 (L) 12/14/2022   Lab Results  Component Value Date   RETICCTPCT 1.3 12/14/2022   RBC 3.91 10/13/2021   RBC 3.84 (L) 10/13/2021   No results found for: "KPAFRELGTCHN", "LAMBDASER", "KAPLAMBRATIO" No results found for: "IGGSERUM", "IGA", "IGMSERUM" No results found for: "TOTALPROTELP", "ALBUMINELP", "A1GS", "A2GS", "BETS", "BETA2SER", "GAMS", "MSPIKE", "SPEI"   Chemistry      Component Value Date/Time   NA 137 12/14/2022 1108   K 4.6 12/14/2022 1108   CL 104 12/14/2022 1108   CO2 23 12/14/2022 1108   BUN 8 12/14/2022 1108   CREATININE 0.92 12/14/2022 1108      Component Value Date/Time   CALCIUM 9.1 12/14/2022 1108   ALKPHOS 45 12/14/2022 1108   AST 16 12/14/2022 1108   ALT 11 12/14/2022 1108   BILITOT 0.5 12/14/2022 1108       Impression and Plan: Shelley Ramirez is a pleasant 42 yo African American female with long history of iron deficiency anemia with heavy cycle.  We will get her set up for 3 doses of IV iron.  Follow-up in 3 months.   Eileen Stanford, NP 6/25/20248:59 AM

## 2023-01-25 ENCOUNTER — Inpatient Hospital Stay: Payer: 59 | Attending: Family

## 2023-01-25 VITALS — BP 99/65 | HR 65 | Temp 97.9°F | Resp 17

## 2023-01-25 DIAGNOSIS — D508 Other iron deficiency anemias: Secondary | ICD-10-CM

## 2023-01-25 DIAGNOSIS — D500 Iron deficiency anemia secondary to blood loss (chronic): Secondary | ICD-10-CM | POA: Diagnosis not present

## 2023-01-25 DIAGNOSIS — N920 Excessive and frequent menstruation with regular cycle: Secondary | ICD-10-CM | POA: Diagnosis not present

## 2023-01-25 MED ORDER — SODIUM CHLORIDE 0.9 % IV SOLN: Freq: Once | INTRAVENOUS | Status: AC

## 2023-01-25 MED ORDER — DIPHENHYDRAMINE HCL 50 MG/ML IJ SOLN
25.0000 mg | Freq: Once | INTRAMUSCULAR | Status: AC
  Administered 2023-01-25: 25 mg via INTRAVENOUS
  Filled 2023-01-25: qty 1

## 2023-01-25 MED ORDER — SODIUM CHLORIDE 0.9 % IV SOLN
300.0000 mg | Freq: Once | INTRAVENOUS | Status: DC
  Filled 2023-01-25: qty 15

## 2023-01-25 NOTE — Patient Instructions (Signed)

## 2023-02-01 ENCOUNTER — Inpatient Hospital Stay: Payer: 59

## 2023-02-01 VITALS — BP 110/76 | HR 82 | Resp 16

## 2023-02-01 DIAGNOSIS — D500 Iron deficiency anemia secondary to blood loss (chronic): Secondary | ICD-10-CM | POA: Diagnosis not present

## 2023-02-01 DIAGNOSIS — N920 Excessive and frequent menstruation with regular cycle: Secondary | ICD-10-CM | POA: Diagnosis not present

## 2023-02-01 DIAGNOSIS — D508 Other iron deficiency anemias: Secondary | ICD-10-CM

## 2023-02-01 MED ORDER — DIPHENHYDRAMINE HCL 50 MG/ML IJ SOLN
25.0000 mg | Freq: Once | INTRAMUSCULAR | Status: AC
  Administered 2023-02-01: 25 mg via INTRAVENOUS
  Filled 2023-02-01: qty 1

## 2023-02-01 MED ORDER — SODIUM CHLORIDE 0.9 % IV SOLN
300.0000 mg | Freq: Once | INTRAVENOUS | Status: AC
  Administered 2023-02-01: 300 mg via INTRAVENOUS
  Filled 2023-02-01: qty 300

## 2023-02-01 MED ORDER — SODIUM CHLORIDE 0.9 % IV SOLN: Freq: Once | INTRAVENOUS | Status: AC

## 2023-02-01 NOTE — Patient Instructions (Signed)

## 2023-02-06 ENCOUNTER — Other Ambulatory Visit: Payer: Self-pay

## 2023-02-08 ENCOUNTER — Other Ambulatory Visit (HOSPITAL_BASED_OUTPATIENT_CLINIC_OR_DEPARTMENT_OTHER): Payer: Self-pay

## 2023-02-08 ENCOUNTER — Ambulatory Visit: Payer: 59 | Admitting: Bariatrics

## 2023-02-08 ENCOUNTER — Inpatient Hospital Stay: Payer: 59

## 2023-02-08 ENCOUNTER — Other Ambulatory Visit (INDEPENDENT_AMBULATORY_CARE_PROVIDER_SITE_OTHER): Payer: Self-pay | Admitting: Bariatrics

## 2023-02-08 ENCOUNTER — Telehealth: Payer: Self-pay | Admitting: Bariatrics

## 2023-02-08 VITALS — BP 100/65 | HR 94 | Temp 98.4°F | Resp 18

## 2023-02-08 DIAGNOSIS — E88819 Insulin resistance, unspecified: Secondary | ICD-10-CM

## 2023-02-08 DIAGNOSIS — R632 Polyphagia: Secondary | ICD-10-CM

## 2023-02-08 DIAGNOSIS — D508 Other iron deficiency anemias: Secondary | ICD-10-CM

## 2023-02-08 DIAGNOSIS — N920 Excessive and frequent menstruation with regular cycle: Secondary | ICD-10-CM | POA: Diagnosis not present

## 2023-02-08 DIAGNOSIS — D500 Iron deficiency anemia secondary to blood loss (chronic): Secondary | ICD-10-CM | POA: Diagnosis not present

## 2023-02-08 MED ORDER — TIRZEPATIDE 10 MG/0.5ML ~~LOC~~ SOAJ
10.0000 mg | SUBCUTANEOUS | 0 refills | Status: DC
  Filled 2023-02-08 – 2023-02-10 (×3): qty 2, 28d supply, fill #0

## 2023-02-08 MED ORDER — METHYLPREDNISOLONE SODIUM SUCC 125 MG IJ SOLR
60.0000 mg | Freq: Once | INTRAMUSCULAR | Status: AC
  Administered 2023-02-08: 60 mg via INTRAVENOUS
  Filled 2023-02-08: qty 2

## 2023-02-08 MED ORDER — SODIUM CHLORIDE 0.9 % IV SOLN: Freq: Once | INTRAVENOUS | Status: AC

## 2023-02-08 MED ORDER — DIPHENHYDRAMINE HCL 50 MG/ML IJ SOLN
25.0000 mg | Freq: Once | INTRAMUSCULAR | Status: AC
  Administered 2023-02-08: 25 mg via INTRAVENOUS
  Filled 2023-02-08: qty 1

## 2023-02-08 MED ORDER — SODIUM CHLORIDE 0.9 % IV SOLN
300.0000 mg | Freq: Once | INTRAVENOUS | Status: AC
  Administered 2023-02-08: 300 mg via INTRAVENOUS
  Filled 2023-02-08: qty 300

## 2023-02-08 NOTE — Patient Instructions (Signed)
Iron Sucrose Injection What is this medication? IRON SUCROSE (EYE ern SOO krose) treats low levels of iron (iron deficiency anemia) in people with kidney disease. Iron is a mineral that plays an important role in making red blood cells, which carry oxygen from your lungs to the rest of your body. This medicine may be used for other purposes; ask your health care provider or pharmacist if you have questions. COMMON BRAND NAME(S): Venofer What should I tell my care team before I take this medication? They need to know if you have any of these conditions: Anemia not caused by low iron levels Heart disease High levels of iron in the blood Kidney disease Liver disease An unusual or allergic reaction to iron, other medications, foods, dyes, or preservatives Pregnant or trying to get pregnant Breastfeeding How should I use this medication? This medication is for infusion into a vein. It is given in a hospital or clinic setting. Talk to your care team about the use of this medication in children. While this medication may be prescribed for children as young as 2 years for selected conditions, precautions do apply. Overdosage: If you think you have taken too much of this medicine contact a poison control center or emergency room at once. NOTE: This medicine is only for you. Do not share this medicine with others. What if I miss a dose? Keep appointments for follow-up doses. It is important not to miss your dose. Call your care team if you are unable to keep an appointment. What may interact with this medication? Do not take this medication with any of the following: Deferoxamine Dimercaprol Other iron products This medication may also interact with the following: Chloramphenicol Deferasirox This list may not describe all possible interactions. Give your health care provider a list of all the medicines, herbs, non-prescription drugs, or dietary supplements you use. Also tell them if you smoke,  drink alcohol, or use illegal drugs. Some items may interact with your medicine. What should I watch for while using this medication? Visit your care team regularly. Tell your care team if your symptoms do not start to get better or if they get worse. You may need blood work done while you are taking this medication. You may need to follow a special diet. Talk to your care team. Foods that contain iron include: whole grains/cereals, dried fruits, beans, or peas, leafy green vegetables, and organ meats (liver, kidney). What side effects may I notice from receiving this medication? Side effects that you should report to your care team as soon as possible: Allergic reactions--skin rash, itching, hives, swelling of the face, lips, tongue, or throat Low blood pressure--dizziness, feeling faint or lightheaded, blurry vision Shortness of breath Side effects that usually do not require medical attention (report to your care team if they continue or are bothersome): Flushing Headache Joint pain Muscle pain Nausea Pain, redness, or irritation at injection site This list may not describe all possible side effects. Call your doctor for medical advice about side effects. You may report side effects to FDA at 1-800-FDA-1088. Where should I keep my medication? This medication is given in a hospital or clinic and will not be stored at home. NOTE: This sheet is a summary. It may not cover all possible information. If you have questions about this medicine, talk to your doctor, pharmacist, or health care provider.  2024 Elsevier/Gold Standard (2022-01-19 00:00:00)  

## 2023-02-08 NOTE — Addendum Note (Signed)
Addended by: Albertha Ghee on: 02/08/2023 03:18 PM   Modules accepted: Orders

## 2023-02-08 NOTE — Telephone Encounter (Signed)
Pt needs a refill of her Mounjaro. Pt had an infusion appt that caused her to miss her appt with Dr. Manson Passey today.

## 2023-02-08 NOTE — Progress Notes (Signed)
Patient returned to [facility] after being discharged from an iron infusion. Patient complained of left forearm slightly swollen and maybe the beginning of tiny bumps. No rash, itching or SOB noted. Eileen Stanford, NP, assessed the arm and ordered Solu-Medrol 60mg  SQ. Medication given at 15:28pm. Patient was discharged at 16:10 pm without  any complaints. Swelling had decreased and no bumps were seen. VSS. Instructed patient to observe her arm and call us with any questions or concerns.

## 2023-02-10 ENCOUNTER — Other Ambulatory Visit (HOSPITAL_BASED_OUTPATIENT_CLINIC_OR_DEPARTMENT_OTHER): Payer: Self-pay

## 2023-02-10 ENCOUNTER — Encounter: Payer: Self-pay | Admitting: Family

## 2023-02-10 ENCOUNTER — Other Ambulatory Visit: Payer: Self-pay | Admitting: Bariatrics

## 2023-02-10 DIAGNOSIS — E559 Vitamin D deficiency, unspecified: Secondary | ICD-10-CM

## 2023-02-11 IMAGING — MG MM DIGITAL SCREENING BILAT W/ TOMO AND CAD
8 of 14 series · 8 of 40 positions shown · non-contrast
Comparison: None.

CLINICAL DATA: Screening.

EXAM:
DIGITAL SCREENING BILATERAL MAMMOGRAM WITH TOMOSYNTHESIS AND CAD
TECHNIQUE: Bilateral screening digital craniocaudal and mediolateral oblique
mammograms were obtained. Bilateral screening digital breast
tomosynthesis was performed. The images were evaluated with
computer-aided detection.

[R MLO synth-2D (1 of 2)]
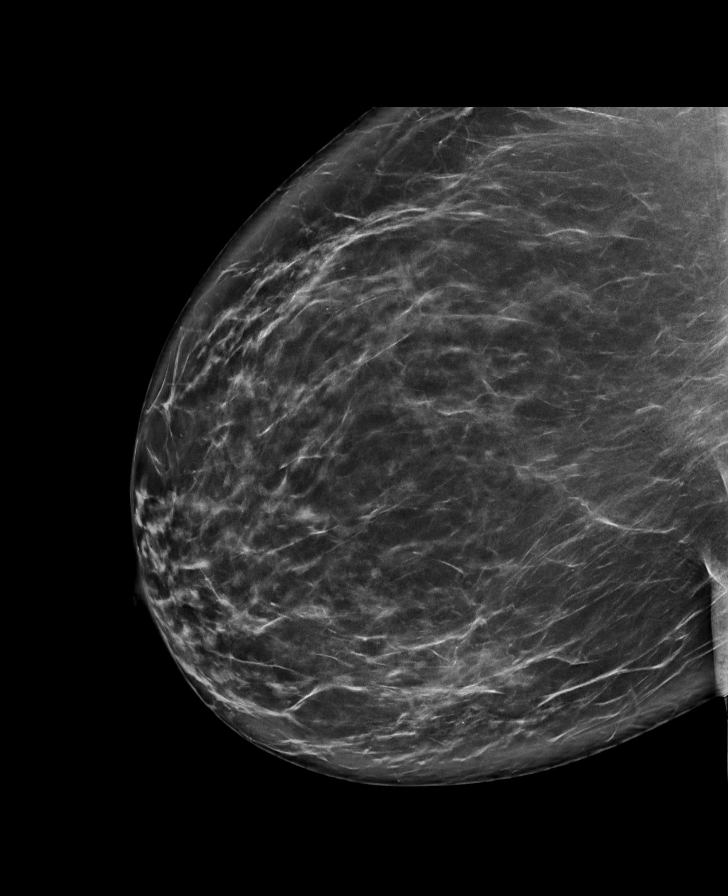

[L CC synth-2D]
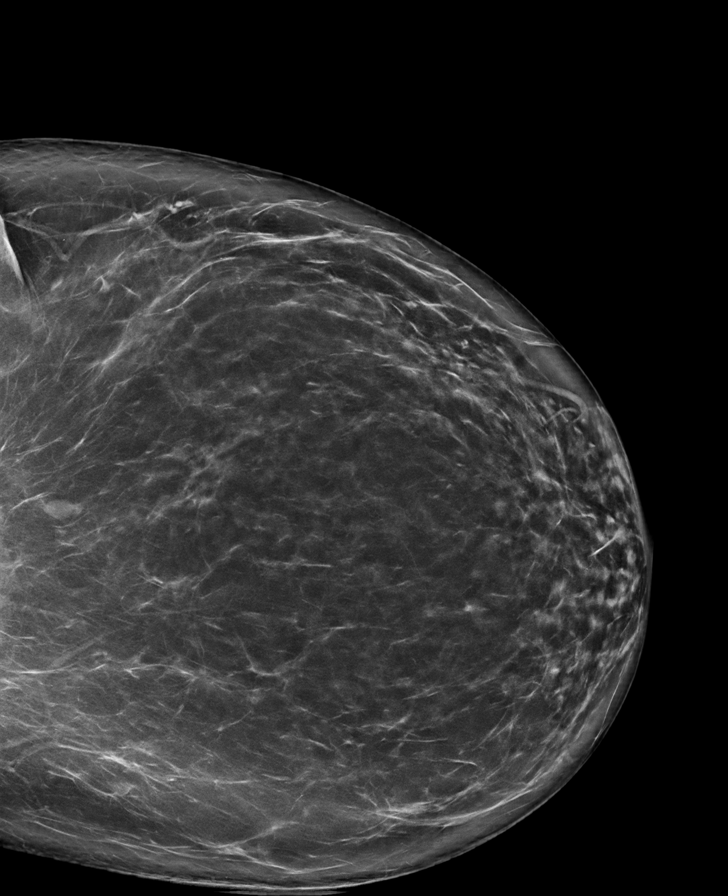

[L MLO synth-2D (1 of 2)]
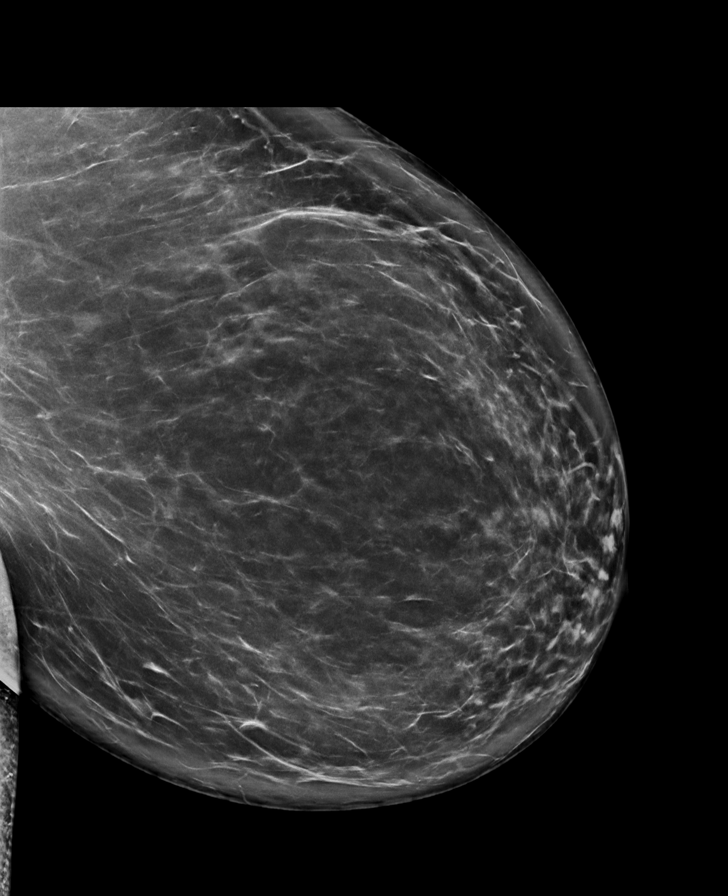

[L MLO synth-2D (2 of 2)]
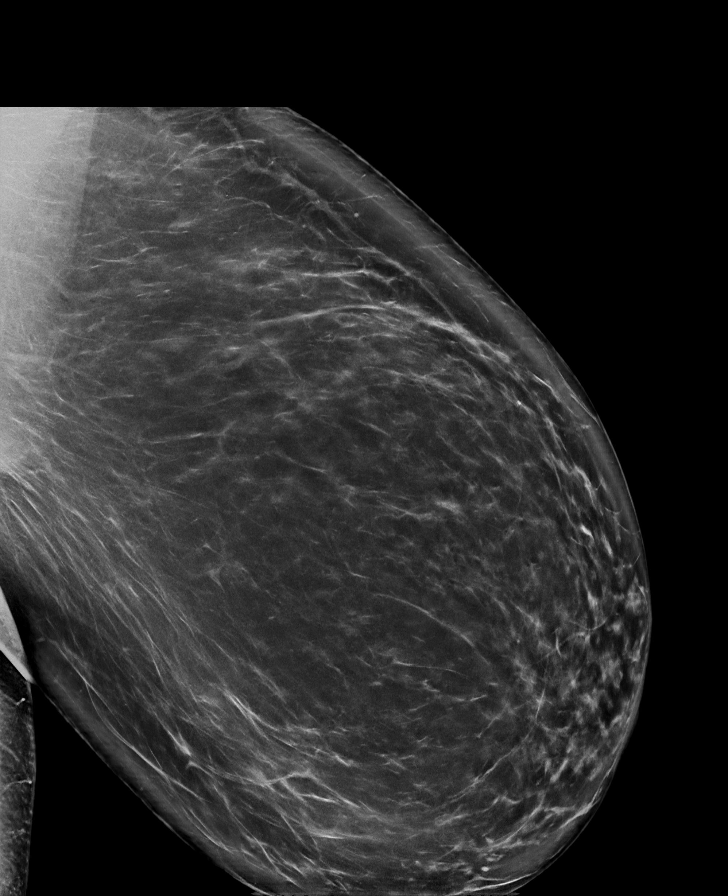

[R MLO synth-2D (2 of 2)]
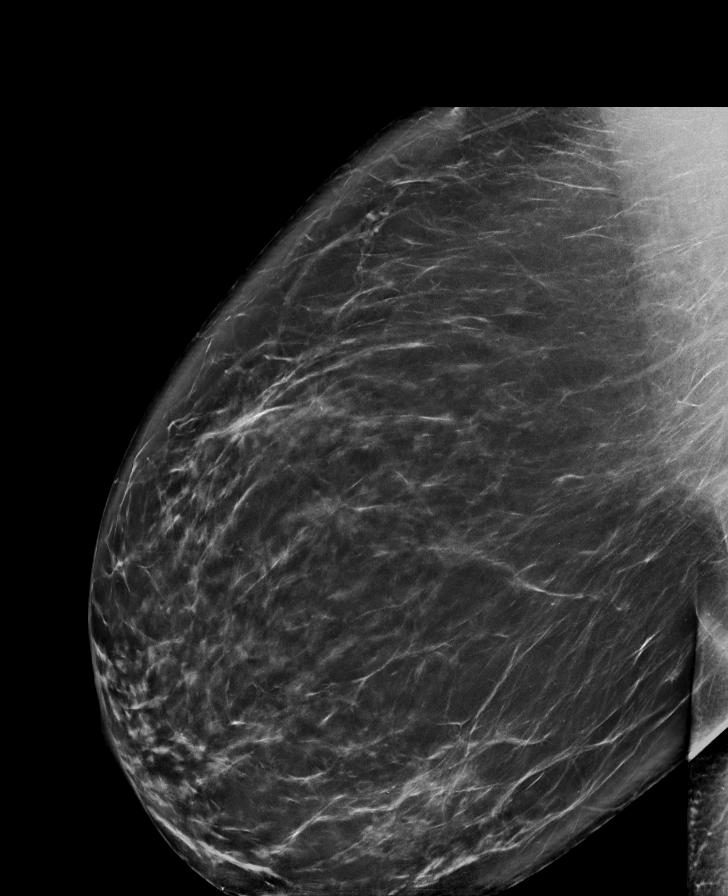

[R CV synth-2D]
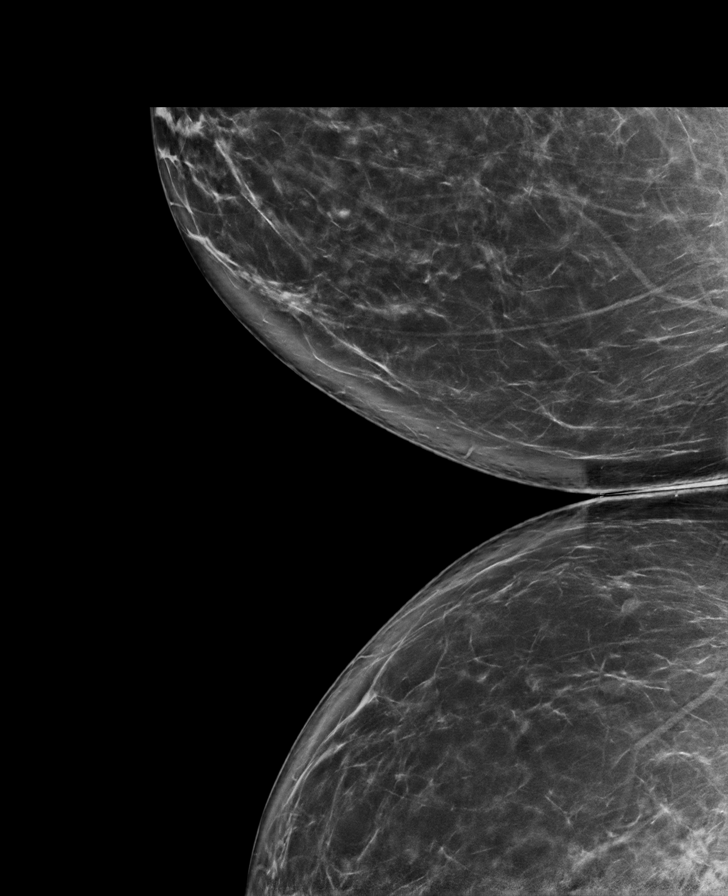

[R CC synth-2D]
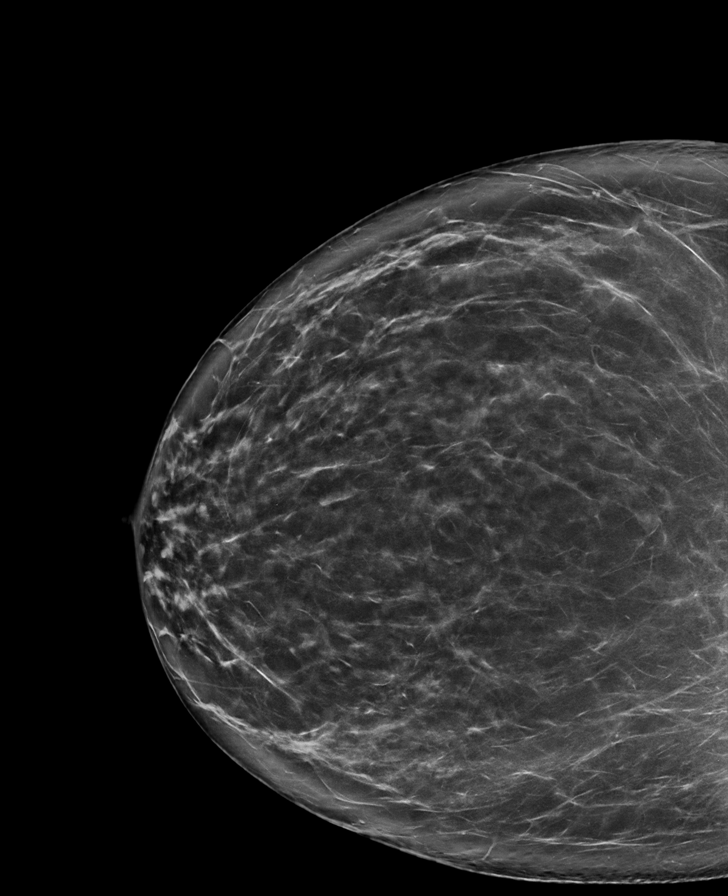

[L CC tomo · tomo slice 49/97.0]
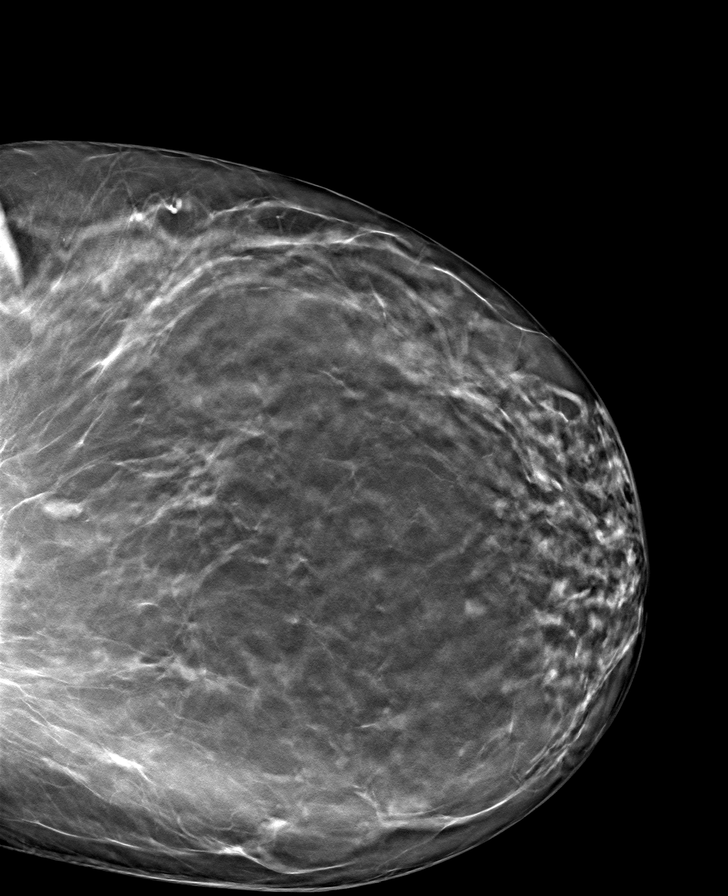

[8 of 40 positions shown; findings below may reference images not displayed]

ACR Breast Density Category b: There are scattered areas of
fibroglandular density.
FINDINGS: There are no findings suspicious for malignancy.
IMPRESSION: No mammographic evidence of malignancy. A result letter of this
screening mammogram will be mailed directly to the patient.

RECOMMENDATION:
Screening mammogram in one year. (Code:[id number]-X-X7B)

BI-RADS CATEGORY  1: Negative.

## 2023-02-13 ENCOUNTER — Other Ambulatory Visit (HOSPITAL_BASED_OUTPATIENT_CLINIC_OR_DEPARTMENT_OTHER): Payer: Self-pay

## 2023-02-13 ENCOUNTER — Other Ambulatory Visit: Payer: Self-pay

## 2023-03-08 ENCOUNTER — Encounter: Payer: Self-pay | Admitting: Bariatrics

## 2023-03-08 ENCOUNTER — Ambulatory Visit: Payer: 59 | Admitting: Bariatrics

## 2023-03-08 ENCOUNTER — Other Ambulatory Visit (HOSPITAL_BASED_OUTPATIENT_CLINIC_OR_DEPARTMENT_OTHER): Payer: Self-pay

## 2023-03-08 VITALS — BP 105/73 | HR 86 | Temp 98.3°F | Ht 61.0 in | Wt 158.0 lb

## 2023-03-08 DIAGNOSIS — Z6829 Body mass index (BMI) 29.0-29.9, adult: Secondary | ICD-10-CM

## 2023-03-08 DIAGNOSIS — R632 Polyphagia: Secondary | ICD-10-CM

## 2023-03-08 DIAGNOSIS — E669 Obesity, unspecified: Secondary | ICD-10-CM | POA: Diagnosis not present

## 2023-03-08 DIAGNOSIS — E88819 Insulin resistance, unspecified: Secondary | ICD-10-CM

## 2023-03-08 DIAGNOSIS — E559 Vitamin D deficiency, unspecified: Secondary | ICD-10-CM | POA: Diagnosis not present

## 2023-03-08 DIAGNOSIS — G43909 Migraine, unspecified, not intractable, without status migrainosus: Secondary | ICD-10-CM | POA: Diagnosis not present

## 2023-03-08 DIAGNOSIS — R110 Nausea: Secondary | ICD-10-CM

## 2023-03-08 DIAGNOSIS — G43809 Other migraine, not intractable, without status migrainosus: Secondary | ICD-10-CM

## 2023-03-08 MED ORDER — TIRZEPATIDE 10 MG/0.5ML ~~LOC~~ SOAJ
10.0000 mg | SUBCUTANEOUS | 0 refills | Status: DC
Start: 2023-03-08 — End: 2023-04-06
  Filled 2023-03-08 – 2023-03-13 (×2): qty 2, 28d supply, fill #0

## 2023-03-08 MED ORDER — VITAMIN D (ERGOCALCIFEROL) 1.25 MG (50000 UNIT) PO CAPS
50000.0000 [IU] | ORAL_CAPSULE | ORAL | 0 refills | Status: DC
Start: 2023-03-08 — End: 2023-07-03
  Filled 2023-03-08: qty 4, 28d supply, fill #0
  Filled 2023-04-06: qty 4, 28d supply, fill #1

## 2023-03-08 MED ORDER — ONDANSETRON 4 MG PO TBDP
4.0000 mg | ORAL_TABLET | Freq: Three times a day (TID) | ORAL | 0 refills | Status: DC | PRN
Start: 2023-03-08 — End: 2023-05-17
  Filled 2023-03-08: qty 18, 21d supply, fill #0
  Filled 2023-04-06: qty 12, 4d supply, fill #1

## 2023-03-08 NOTE — Progress Notes (Signed)
WEIGHT SUMMARY AND BIOMETRICS  Weight Lost Since Last Visit: 0lb  Weight Gained Since Last Visit: 2lb   Vitals Temp: 98.3 F (36.8 C) BP: 105/73 Pulse Rate: 86 SpO2: 99 %   Anthropometric Measurements Height: 5\' 1"  (1.549 m) Weight: 158 lb (71.7 kg) BMI (Calculated): 29.87 Weight at Last Visit: 156lb Weight Lost Since Last Visit: 0lb Weight Gained Since Last Visit: 2lb Starting Weight: 205lb Total Weight Loss (lbs): 47 lb (21.3 kg)   Body Composition  Body Fat %: 35.2 % Fat Mass (lbs): 55.6 lbs Muscle Mass (lbs): 97.2 lbs Total Body Water (lbs): 68.6 lbs Visceral Fat Rating : 7   Other Clinical Data Fasting: No Labs: No Today's Visit #: 25 Starting Date: 03/25/21    OBESITY Shelley Ramirez is here to discuss her progress with her obesity treatment plan along with follow-up of her obesity related diagnoses.     Nutrition Plan: the Category 1 plan - 85% adherence.  Current exercise: none  Interim History:  She gained 2 lbs since her last visit.  Eating all of the food on the plan., Protein intake is as prescribed, Is not skipping meals, Water intake is adequate., and Denies polyphagia  Pharmacotherapy: Shelley Ramirez is on Mounjaro 10 mg SQ weekly Adverse side effects: None Hunger is moderately controlled.  Cravings are moderately controlled.  Assessment/Plan:   Polyphagia Shelley Ramirez endorses excessive hunger.  Medication(s): Mounjaro Effects of medication:  moderately controlled. Cravings are moderately controlled.   Plan: Medication(s): Mounjaro 10 mg SQ weekly Will increase water, protein and fiber to help assuage hunger.  Will minimize foods that have a high glucose index/load to minimize reactive hypoglycemia.     Generalized Obesity: Current BMI BMI (Calculated): 29.87   Pharmacotherapy Plan Continue and refill  Mounjaro 10 mg SQ weekly   Migraines (without status migrainosus):  She is taking Imitrex and Zofran as needed. She states that her  migraines are somewhat improved.   Plan: Continue plan and medications.   Vitamin D Deficiency Vitamin D is not at goal of 50.  Most recent vitamin D level was 27.6. She is on  prescription ergocalciferol 50,000 IU weekly. Lab Results  Component Value Date   VD25OH 27.6 (L) 12/14/2022   VD25OH 22.0 (L) 04/19/2022   VD25OH 15.0 (L) 10/07/2021    Plan: Refill prescription vitamin D 50,000 IU weekly.   Shelley Ramirez is currently in the action stage of change. As such, her goal is to continue with weight loss efforts.  She has agreed to the Category 1 plan.  Exercise goals: For substantial health benefits, adults should do at least 150 minutes (2 hours and 30 minutes) a week of moderate-intensity, or 75 minutes (1 hour and 15 minutes) a week of vigorous-intensity aerobic physical activity, or an equivalent combination of moderate- and vigorous-intensity aerobic activity. Aerobic activity should be performed in episodes of at least 10 minutes, and preferably, it should be spread throughout the week.  Behavioral modification strategies: no meal skipping, meal planning , planning for success, increasing vegetables, increasing fiber rich foods, and mindful eating.  Shelley Ramirez has agreed to follow-up with our clinic in 4 weeks.      Objective:   VITALS: Per patient if applicable, see vitals. GENERAL: Alert and in no acute distress. CARDIOPULMONARY: No increased WOB. Speaking in clear sentences.  PSYCH: Pleasant and cooperative. Speech normal rate and rhythm. Affect is appropriate. Insight and judgement are appropriate. Attention is focused, linear, and appropriate.  NEURO: Oriented as arrived to appointment on time  with no prompting.   Attestation Statements:   This was prepared with the assistance of Engineer, civil (consulting).  Occasional wrong-word or sound-a-like substitutions may have occurred due to the inherent limitations of voice recognition software.

## 2023-03-13 ENCOUNTER — Other Ambulatory Visit (HOSPITAL_BASED_OUTPATIENT_CLINIC_OR_DEPARTMENT_OTHER): Payer: Self-pay

## 2023-03-22 ENCOUNTER — Encounter: Payer: Self-pay | Admitting: Family

## 2023-03-22 ENCOUNTER — Other Ambulatory Visit (HOSPITAL_BASED_OUTPATIENT_CLINIC_OR_DEPARTMENT_OTHER): Payer: Self-pay

## 2023-03-22 ENCOUNTER — Ambulatory Visit: Payer: 59 | Admitting: Family

## 2023-03-22 VITALS — BP 107/65 | HR 96 | Temp 98.1°F | Resp 16 | Ht 61.0 in | Wt 158.0 lb

## 2023-03-22 DIAGNOSIS — E559 Vitamin D deficiency, unspecified: Secondary | ICD-10-CM | POA: Diagnosis not present

## 2023-03-22 DIAGNOSIS — D508 Other iron deficiency anemias: Secondary | ICD-10-CM | POA: Diagnosis not present

## 2023-03-22 DIAGNOSIS — G4700 Insomnia, unspecified: Secondary | ICD-10-CM

## 2023-03-22 DIAGNOSIS — G43909 Migraine, unspecified, not intractable, without status migrainosus: Secondary | ICD-10-CM | POA: Diagnosis not present

## 2023-03-22 DIAGNOSIS — Z6829 Body mass index (BMI) 29.0-29.9, adult: Secondary | ICD-10-CM

## 2023-03-22 DIAGNOSIS — J309 Allergic rhinitis, unspecified: Secondary | ICD-10-CM

## 2023-03-22 DIAGNOSIS — Z1231 Encounter for screening mammogram for malignant neoplasm of breast: Secondary | ICD-10-CM

## 2023-03-22 MED ORDER — TRAZODONE HCL 50 MG PO TABS
25.0000 mg | ORAL_TABLET | Freq: Every evening | ORAL | 3 refills | Status: AC | PRN
  Filled 2023-03-22: qty 30, 30d supply, fill #0

## 2023-03-22 NOTE — Assessment & Plan Note (Addendum)
Likely due to heavy menstrual cycles. She is followed by hematology for IV iron infusions upstairs.

## 2023-03-22 NOTE — Assessment & Plan Note (Signed)
  Difficulty sleeping, previously tried Amitriptyline but found it ineffective and disliked the drowsy after-effects. -Discontinue Amitriptyline. -Start Trazodone, beginning with a half tablet 30 minutes before bed, and increasing to a full tablet if needed.

## 2023-03-22 NOTE — Progress Notes (Signed)
Subjective:     Patient ID: Shelley Ramirez, female    DOB: Apr 15, 1981, 42 y.o.   MRN: [id number]  Chief Complaint  Patient presents with   New Patient (Initial Visit)    Used to go to [facility] physicians, PCP retired    HPI  Discussed the use of AI scribe software for clinical note transcription with the patient, who gave verbal consent to proceed.  History of Present Illness   Shelley Ramirez, a patient with a history of significant weight loss through the use of Mounjaro, presents for a routine check-up and to establish care with a new primary care provider. The patient has been managing her weight loss without any significant side effects from the medication. She is followed by [facility]. She has been able to maintain her weight loss even during periods of not taking the medication.  The patient reports occasional headaches, particularly before her menstrual cycle. These headaches are severe enough to interfere with daily activities and are managed with sumatriptan, which provides relief. The patient has a family history of migraines.  The patient also reports difficulty sleeping and is seeking an alternative to amitriptyline, which she finds ineffective and leaves her feeling drowsy the next day.  The patient also mentions infrequent bowel movements, occurring about twice a week. However, she does not feel bloated or constipated and is comfortable with this pattern.  The patient is due for routine health screenings including a mammogram and Pap smear. She also expresses awareness of the need for a colonoscopy at age 57.  The patient is a non-smoker, does not consume alcohol, and does not use recreational drugs. She is not currently using any form of birth control and is comfortable with the possibility of pregnancy.          Health Maintenance Due  Topic Date Due   HIV Screening  Never done   DTaP/Tdap/Td (1 - Tdap) Never done   PAP SMEAR-Modifier   Never done   COVID-19 Vaccine (1 - 2023-24 season) Never done   INFLUENZA VACCINE  02/23/2023    Past Medical History:  Diagnosis Date   Acid reflux    Anemia    Lactose intolerance    Other fatigue    SOB (shortness of breath) on exertion    Vitamin D deficiency     Past Surgical History:  Procedure Laterality Date   CESAREAN SECTION     14years ago    Family History  Problem Relation Age of Onset   Hypertension Mother    Kidney disease Mother    Sudden death Mother        died from allergic reaction to medication   Stroke Father        x 2   Hypertension Father    Alcoholism Father    Fibroids Sister        s/p hysterectomy   Diabetes Mellitus II Maternal Grandmother    Cancer Maternal Aunt    Diabetes Maternal Aunt     Social History   Socioeconomic History   Marital status: Married    Spouse name: Not on file   Number of children: Not on file   Years of education: Not on file   Highest education level: Not on file  Occupational History   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  Tobacco Use   Smoking status: Never   Smokeless tobacco: Never  Vaping Use   Vaping status: Never  Used  Substance and Sexual Activity   Alcohol use: No   Drug use: No   Sexual activity: Yes    Comment: not on birth control  Other Topics Concern   Not on file  Social History Narrative   Right handed    Caffeine 1 daily   Lives in two story home , with husband and brother   She owns a substance abuse counseling business in [place]    Married   One son born 1999- lives in [place], he is a Runner, broadcasting/film/video   Has one step son and one step daughter- both adults   Completed bachelors degree   Enjoys zumba, church, reading   No pets   Social Determinants of Health   Financial Resource Strain: Not on file  Food Insecurity: Not on file  Transportation Needs: Not on file  Physical Activity: Insufficiently Active (03/22/2023)   Exercise Vital Sign    Days  of Exercise per Week: 2 days    Minutes of Exercise per Session: 60 min  Stress: Not on file  Social Connections: Not on file  Intimate Partner Violence: Not on file    Outpatient Medications Prior to Visit  Medication Sig Dispense Refill   Clindamycin-Benzoyl Per, Refr, gel Apply to face every other morning 45 g 11   EPINEPHrine (EPIPEN 2-PAK) 0.3 mg/0.3 mL IJ SOAJ injection Inject 0.3 mg into the muscle as needed for anaphylaxis. Use in event of severe allergic reaction (throat closing, unable to breath, about to pass out or faint). 2 each 1   ondansetron (ZOFRAN-ODT) 4 MG disintegrating tablet Take 1 tablet (4 mg total) by mouth every 8 (eight) hours as needed for nausea or vomiting. 30 tablet 0   SUMAtriptan (IMITREX) 50 MG tablet Take 1 tablet by mouth at onset of migraine. May repeat in 2 hours if headache persists; limit 4 tablets in 24 hours by mouth. 30 tablet 0   tirzepatide (MOUNJARO) 10 MG/0.5ML Pen Inject 10 mg into the skin once a week. 2 mL 0   tretinoin (RETIN-A) 0.025 % cream Apply 1/2 dime sized amount on cheeks and chin nightly to tolerance. 45 g 3   Vitamin D, Ergocalciferol, (DRISDOL) 1.25 MG (50000 UNIT) CAPS capsule Take 1 capsule (50,000 Units total) by mouth every 7 (seven) days. 8 capsule 0   amitriptyline (ELAVIL) 25 MG tablet Take 1 tablet (25 mg total) by mouth at bedtime as needed for sleep. 30 tablet 0   No facility-administered medications prior to visit.    Allergies  Allergen Reactions   Codeine Shortness Of Breath and Other (See Comments)    Hyperventilation, pass out, loss off consciousness     Clindamycin Nausea And Vomiting   Penicillin G Rash    Review of Systems  Constitutional:  Positive for weight loss.  HENT:  Positive for congestion (allergies). Negative for hearing loss.   Eyes:  Negative for blurred vision.  Respiratory:  Negative for cough.   Cardiovascular:  Negative for leg swelling.  Gastrointestinal:  Negative for constipation and  diarrhea.  Genitourinary:  Negative for dysuria and frequency.  Skin:  Negative for rash.  Neurological:  Positive for headaches (migraines no aura).  Psychiatric/Behavioral:         Denies depression/anxiety       Objective:    Physical Exam Constitutional:      General: She is not in acute distress.    Appearance: Normal appearance. She is well-developed.  HENT:     Head: Normocephalic  and atraumatic.     Right Ear: Tympanic membrane, ear canal and external ear normal.     Left Ear: Tympanic membrane, ear canal and external ear normal.  Eyes:     General: No scleral icterus. Neck:     Thyroid: No thyromegaly.  Cardiovascular:     Rate and Rhythm: Normal rate and regular rhythm.     Heart sounds: Normal heart sounds. No murmur heard. Pulmonary:     Effort: Pulmonary effort is normal. No respiratory distress.     Breath sounds: Normal breath sounds. No wheezing.  Musculoskeletal:     Cervical back: Neck supple.  Lymphadenopathy:     Cervical: No cervical adenopathy.  Skin:    General: Skin is warm and dry.  Neurological:     Mental Status: She is alert and oriented to person, place, and time.     Deep Tendon Reflexes:     Reflex Scores:      Patellar reflexes are 2+ on the right side.      Achilles reflexes are 2+ on the right side. Psychiatric:        Mood and Affect: Mood normal.        Behavior: Behavior normal.        Thought Content: Thought content normal.        Judgment: Judgment normal.      BP 107/65 (BP Location: Right Arm, Patient Position: Sitting, Cuff Size: Small)   Pulse 96   Temp 98.1 F (36.7 C) (Oral)   Resp 16   Ht 5\' 1"  (1.549 m)   Wt 158 lb (71.7 kg)   LMP  (LMP Unknown)   SpO2 100%   BMI 29.85 kg/m  Wt Readings from Last 3 Encounters:  03/22/23 158 lb (71.7 kg)  03/08/23 158 lb (71.7 kg)  01/17/23 163 lb 12.8 oz (74.3 kg)       Assessment & Plan:   Problem List Items Addressed This Visit       Unprioritized   Vitamin D  deficiency    Being treated with weekly vit D supplement.      Migraines    Severe headaches associated with menstrual cycle, responsive to Sumatriptan. -Continue Sumatriptan as needed for menstrual migraines.      Relevant Medications   traZODone (DESYREL) 50 MG tablet   Iron deficiency anemia    Likely due to heavy menstrual cycles. She is followed by hematology for IV iron infusions upstairs.         Insomnia     Difficulty sleeping, previously tried Amitriptyline but found it ineffective and disliked the drowsy after-effects. -Discontinue Amitriptyline. -Start Trazodone, beginning with a half tablet 30 minutes before bed, and increasing to a full tablet if needed.       BMI 29.0-29.9,adult    Significant weight loss achieved with Mounjaro. Patient is self-paying for the medication and has been able to maintain weight loss even when not taking the medication for a few months. -Continue Mounjaro as tolerated and as financially feasible.      Allergic rhinitis     Recent onset of congestion and postnasal drip, likely due to ragweed season. -Start over-the-counter Claritin as needed.      Other Visit Diagnoses     Breast cancer screening by mammogram    -  Primary   Relevant Orders   MM 3D SCREENING MAMMOGRAM BILATERAL BREAST       I have discontinued Najee Ogburn Graves's amitriptyline. I am also having  her start on traZODone. Additionally, I am having her maintain her EPINEPHrine, SUMAtriptan, Clindamycin-Benzoyl Per (Refr), tretinoin, tirzepatide, ondansetron, and Vitamin D (Ergocalciferol).  Meds ordered this encounter  Medications   traZODone (DESYREL) 50 MG tablet    Sig: Take 0.5-1 tablets (25-50 mg total) by mouth at bedtime as needed for sleep.    Dispense:  30 tablet    Refill:  3    Order Specific Question:   Supervising Provider    Answer:   Danise Edge A [[id number]]

## 2023-03-22 NOTE — Assessment & Plan Note (Signed)
Significant weight loss achieved with Mounjaro. Patient is self-paying for the medication and has been able to maintain weight loss even when not taking the medication for a few months. -Continue Mounjaro as tolerated and as financially feasible.

## 2023-03-22 NOTE — Assessment & Plan Note (Signed)
  Recent onset of congestion and postnasal drip, likely due to ragweed season. -Start over-the-counter Claritin as needed.

## 2023-03-22 NOTE — Assessment & Plan Note (Signed)
Being treated with weekly vit D supplement.

## 2023-03-22 NOTE — Assessment & Plan Note (Signed)
Severe headaches associated with menstrual cycle, responsive to Sumatriptan. -Continue Sumatriptan as needed for menstrual migraines.

## 2023-03-22 NOTE — Patient Instructions (Signed)
VISIT SUMMARY:  During your visit, we discussed your weight management, menstrual migraines, insomnia, and allergic rhinitis. We also talked about your general health maintenance, including routine screenings.  YOUR PLAN:  -WEIGHT MANAGEMENT: You've done well with your weight loss using Mounjaro. Continue taking this medication as long as you can tolerate it and afford it.  -MENSTRUAL MIGRAINES: Your severe headaches associated with your menstrual cycle are being managed well with Sumatriptan. Continue using this medication as needed.  -INSOMNIA: You've had difficulty sleeping and didn't find Amitriptyline helpful. Stop taking Amitriptyline and start taking Trazodone. Begin with a half tablet 30 minutes before bed, and increase to a full tablet if needed.  -ALLERGIC RHINITIS: Your recent congestion and postnasal drip are likely due to ragweed season. Start taking over-the-counter Claritin as needed.  -GENERAL HEALTH MAINTENANCE: We will order a mammogram, which was last performed in 2022. We will also schedule a physical with a Pap smear. Continue taking your Vitamin D supplement for your previously identified deficiency. Please plan to begin colonoscopy screening when you turn 44.  INSTRUCTIONS:  Please remember to take your medications as directed. If you have any side effects or concerns, don't hesitate to contact us. We will be scheduling your mammogram and physical with Pap smear.

## 2023-03-23 ENCOUNTER — Inpatient Hospital Stay (HOSPITAL_BASED_OUTPATIENT_CLINIC_OR_DEPARTMENT_OTHER): Admission: RE | Admit: 2023-03-23 | Payer: 59 | Source: Ambulatory Visit

## 2023-04-06 ENCOUNTER — Other Ambulatory Visit: Payer: Self-pay | Admitting: Bariatrics

## 2023-04-06 ENCOUNTER — Telehealth (INDEPENDENT_AMBULATORY_CARE_PROVIDER_SITE_OTHER): Payer: Self-pay | Admitting: Bariatrics

## 2023-04-06 ENCOUNTER — Other Ambulatory Visit (HOSPITAL_BASED_OUTPATIENT_CLINIC_OR_DEPARTMENT_OTHER): Payer: Self-pay

## 2023-04-06 ENCOUNTER — Ambulatory Visit: Payer: 59 | Admitting: Bariatrics

## 2023-04-06 ENCOUNTER — Encounter: Payer: Self-pay | Admitting: Bariatrics

## 2023-04-06 DIAGNOSIS — R632 Polyphagia: Secondary | ICD-10-CM

## 2023-04-06 DIAGNOSIS — E88819 Insulin resistance, unspecified: Secondary | ICD-10-CM

## 2023-04-06 MED ORDER — TIRZEPATIDE 10 MG/0.5ML ~~LOC~~ SOAJ
10.0000 mg | SUBCUTANEOUS | 0 refills | Status: DC
Start: 2023-04-06 — End: 2023-05-17
  Filled 2023-04-06: qty 2, 28d supply, fill #0

## 2023-04-06 NOTE — Telephone Encounter (Signed)
Patient last appointment was 8/14. Canceled for today and next follow-up appointment is 04/27/23. Can you please advise?

## 2023-04-06 NOTE — Telephone Encounter (Signed)
Patient states she needs a refill of Mounjaro. Patient could not make her appt today due to her work schedule.

## 2023-04-10 ENCOUNTER — Other Ambulatory Visit: Payer: Self-pay | Admitting: Bariatrics

## 2023-04-10 ENCOUNTER — Other Ambulatory Visit (INDEPENDENT_AMBULATORY_CARE_PROVIDER_SITE_OTHER): Payer: Self-pay | Admitting: Bariatrics

## 2023-04-10 ENCOUNTER — Telehealth (INDEPENDENT_AMBULATORY_CARE_PROVIDER_SITE_OTHER): Payer: Self-pay | Admitting: Bariatrics

## 2023-04-10 NOTE — Telephone Encounter (Signed)
Patient called stating that she is completely out of SUMAtriptan (IMITREX) 50 MG tablet. Pt requests a refill today. Please call pt at the number on file.

## 2023-04-11 ENCOUNTER — Other Ambulatory Visit (HOSPITAL_BASED_OUTPATIENT_CLINIC_OR_DEPARTMENT_OTHER): Payer: Self-pay

## 2023-04-11 ENCOUNTER — Other Ambulatory Visit: Payer: Self-pay | Admitting: Bariatrics

## 2023-04-11 ENCOUNTER — Encounter: Payer: Self-pay | Admitting: Bariatrics

## 2023-04-11 MED ORDER — SUMATRIPTAN SUCCINATE 50 MG PO TABS
50.0000 mg | ORAL_TABLET | ORAL | 0 refills | Status: DC | PRN
  Filled 2023-04-11: qty 10, 30d supply, fill #0

## 2023-04-18 ENCOUNTER — Encounter (HOSPITAL_BASED_OUTPATIENT_CLINIC_OR_DEPARTMENT_OTHER): Payer: Self-pay

## 2023-04-18 ENCOUNTER — Ambulatory Visit (HOSPITAL_BASED_OUTPATIENT_CLINIC_OR_DEPARTMENT_OTHER)
Admission: RE | Admit: 2023-04-18 | Discharge: 2023-04-18 | Disposition: A | Discharge status: Home / Self Care | Payer: 59 | Source: Ambulatory Visit | Attending: Family | Admitting: Family

## 2023-04-18 DIAGNOSIS — Z1231 Encounter for screening mammogram for malignant neoplasm of breast: Secondary | ICD-10-CM | POA: Insufficient documentation

## 2023-04-19 ENCOUNTER — Other Ambulatory Visit: Payer: 59

## 2023-04-19 ENCOUNTER — Ambulatory Visit: Payer: 59 | Admitting: Family

## 2023-04-19 ENCOUNTER — Other Ambulatory Visit: Payer: Self-pay | Admitting: Family

## 2023-04-19 DIAGNOSIS — R928 Other abnormal and inconclusive findings on diagnostic imaging of breast: Secondary | ICD-10-CM

## 2023-04-21 ENCOUNTER — Ambulatory Visit
Admission: RE | Admit: 2023-04-21 | Discharge: 2023-04-21 | Disposition: A | Discharge status: Home / Self Care | Payer: 59 | Source: Ambulatory Visit | Attending: Family | Admitting: Family

## 2023-04-21 ENCOUNTER — Other Ambulatory Visit (HOSPITAL_BASED_OUTPATIENT_CLINIC_OR_DEPARTMENT_OTHER): Payer: Self-pay

## 2023-04-21 ENCOUNTER — Other Ambulatory Visit: Payer: Self-pay | Admitting: Family

## 2023-04-21 DIAGNOSIS — R928 Other abnormal and inconclusive findings on diagnostic imaging of breast: Secondary | ICD-10-CM

## 2023-04-21 DIAGNOSIS — N6320 Unspecified lump in the left breast, unspecified quadrant: Secondary | ICD-10-CM

## 2023-04-21 DIAGNOSIS — N6325 Unspecified lump in the left breast, overlapping quadrants: Secondary | ICD-10-CM | POA: Diagnosis not present

## 2023-04-26 ENCOUNTER — Other Ambulatory Visit (HOSPITAL_COMMUNITY)
Admission: RE | Admit: 2023-04-26 | Discharge: 2023-04-26 | Disposition: A | Discharge status: Home / Self Care | Payer: 59 | Source: Ambulatory Visit

## 2023-04-26 ENCOUNTER — Ambulatory Visit: Payer: 59 | Admitting: Family

## 2023-04-26 ENCOUNTER — Inpatient Hospital Stay: Payer: 59 | Attending: Family

## 2023-04-26 ENCOUNTER — Inpatient Hospital Stay: Payer: 59 | Admitting: Medical Oncology

## 2023-04-26 ENCOUNTER — Other Ambulatory Visit: Payer: Self-pay

## 2023-04-26 ENCOUNTER — Ambulatory Visit
Admission: RE | Admit: 2023-04-26 | Discharge: 2023-04-26 | Disposition: A | Discharge status: Home / Self Care | Payer: 59 | Source: Ambulatory Visit | Attending: Family | Admitting: Family

## 2023-04-26 ENCOUNTER — Encounter: Payer: Self-pay | Admitting: Medical Oncology

## 2023-04-26 VITALS — BP 105/63 | HR 89 | Temp 98.4°F | Resp 16 | Wt 158.0 lb

## 2023-04-26 VITALS — BP 91/46 | HR 81 | Temp 98.3°F | Resp 18 | Ht 61.0 in | Wt 158.1 lb

## 2023-04-26 DIAGNOSIS — D508 Other iron deficiency anemias: Secondary | ICD-10-CM | POA: Diagnosis not present

## 2023-04-26 DIAGNOSIS — E559 Vitamin D deficiency, unspecified: Secondary | ICD-10-CM | POA: Diagnosis not present

## 2023-04-26 DIAGNOSIS — R928 Other abnormal and inconclusive findings on diagnostic imaging of breast: Secondary | ICD-10-CM | POA: Insufficient documentation

## 2023-04-26 DIAGNOSIS — D500 Iron deficiency anemia secondary to blood loss (chronic): Secondary | ICD-10-CM

## 2023-04-26 DIAGNOSIS — Z124 Encounter for screening for malignant neoplasm of cervix: Secondary | ICD-10-CM | POA: Diagnosis not present

## 2023-04-26 DIAGNOSIS — N6022 Fibroadenosis of left breast: Secondary | ICD-10-CM | POA: Diagnosis not present

## 2023-04-26 DIAGNOSIS — N921 Excessive and frequent menstruation with irregular cycle: Secondary | ICD-10-CM | POA: Diagnosis not present

## 2023-04-26 DIAGNOSIS — Z01419 Encounter for gynecological examination (general) (routine) without abnormal findings: Secondary | ICD-10-CM | POA: Insufficient documentation

## 2023-04-26 DIAGNOSIS — N6320 Unspecified lump in the left breast, unspecified quadrant: Secondary | ICD-10-CM

## 2023-04-26 DIAGNOSIS — N920 Excessive and frequent menstruation with regular cycle: Secondary | ICD-10-CM | POA: Diagnosis not present

## 2023-04-26 DIAGNOSIS — N6325 Unspecified lump in the left breast, overlapping quadrants: Secondary | ICD-10-CM | POA: Diagnosis not present

## 2023-04-26 DIAGNOSIS — D242 Benign neoplasm of left breast: Secondary | ICD-10-CM | POA: Diagnosis not present

## 2023-04-26 DIAGNOSIS — R5383 Other fatigue: Secondary | ICD-10-CM

## 2023-04-26 HISTORY — PX: BREAST BIOPSY: SHX20

## 2023-04-26 LAB — CBC WITH DIFFERENTIAL (CANCER CENTER ONLY)
Abs Immature Granulocytes: 0.01 10*3/uL (ref 0.00–0.07)
Basophils Absolute: 0 10*3/uL (ref 0.0–0.1)
Basophils Relative: 1 %
Eosinophils Absolute: 0.2 10*3/uL (ref 0.0–0.5)
Eosinophils Relative: 6 %
HCT: 36.2 % (ref 36.0–46.0)
Hemoglobin: 11.9 g/dL — ABNORMAL LOW (ref 12.0–15.0)
Immature Granulocytes: 0 %
Lymphocytes Relative: 50 %
Lymphs Abs: 2 10*3/uL (ref 0.7–4.0)
MCH: 31.1 pg (ref 26.0–34.0)
MCHC: 32.9 g/dL (ref 30.0–36.0)
MCV: 94.5 fL (ref 80.0–100.0)
Monocytes Absolute: 0.4 10*3/uL (ref 0.1–1.0)
Monocytes Relative: 10 %
Neutro Abs: 1.3 10*3/uL — ABNORMAL LOW (ref 1.7–7.7)
Neutrophils Relative %: 33 %
Platelet Count: 308 10*3/uL (ref 150–400)
RBC: 3.83 MIL/uL — ABNORMAL LOW (ref 3.87–5.11)
RDW: 13.3 % (ref 11.5–15.5)
WBC Count: 3.9 10*3/uL — ABNORMAL LOW (ref 4.0–10.5)
nRBC: 0 % (ref 0.0–0.2)

## 2023-04-26 LAB — PHOSPHORUS: Phosphorus: 3.5 mg/dL (ref 2.5–4.6)

## 2023-04-26 LAB — RETICULOCYTES
Immature Retic Fract: 6.1 % (ref 2.3–15.9)
RBC.: 3.84 MIL/uL — ABNORMAL LOW (ref 3.87–5.11)
Retic Count, Absolute: 61.8 10*3/uL (ref 19.0–186.0)
Retic Ct Pct: 1.6 % (ref 0.4–3.1)

## 2023-04-26 LAB — FERRITIN: Ferritin: 75 ng/mL (ref 11–307)

## 2023-04-26 NOTE — Assessment & Plan Note (Signed)
On supplement which is being managed by her weight loss provider.

## 2023-04-26 NOTE — Assessment & Plan Note (Signed)
Pap performed today and will be sent for high risk HPV testing.

## 2023-04-26 NOTE — Progress Notes (Signed)
Hematology and Oncology Follow Up Visit  Shelley Ramirez [id number] 1980/11/10 42 y.o. 04/26/2023   Principle Diagnosis:  Iron deficiency anemia secondary to heavy cycles  Current Therapy:   IV iron as indicated - Venofer- last dose 02/08/2023   Interim History:  Shelley Ramirez is here today for follow-up.   She continues to struggle with fatigue even despite iron infusions. She states that symptoms improve initially with iron but after menstrual cycles her fatigue returns.   She is symptomatic with fatigue, dizziness, mild SOB with exertion and palpitations.  Her cycle has been regular with very heavy flow. She also notices large clots. PCP wants her to consider getting a mirena No other blood loss noted. No abnormal bruising, no petechiae.  No fever, chills, n/v, cough, rash, chest pain, abdominal pain or changes in bowel or bladder habits.  No swelling, tenderness, numbness or tingling in her extremities at this time.  No falls or syncope.  Appetite and hydration are good Wt Readings from Last 3 Encounters:  04/26/23 158 lb (71.7 kg)  03/22/23 158 lb (71.7 kg)  03/08/23 158 lb (71.7 kg)     ECOG Performance Status: 1 - Symptomatic but completely ambulatory  Medications:  Allergies as of 04/26/2023       Reactions   Codeine Shortness Of Breath, Other (See Comments)   Hyperventilation, pass out, loss off consciousness     Clindamycin Nausea And Vomiting   Penicillin G Rash        Medication List        Accurate as of April 26, 2023  2:22 PM. If you have any questions, ask your nurse or doctor.          Clindamycin-Benzoyl Per (Refr) gel Apply to face every other morning   EPINEPHrine 0.3 mg/0.3 mL Soaj injection Commonly known as: EpiPen 2-Pak Inject 0.3 mg into the muscle as needed for anaphylaxis. Use in event of severe allergic reaction (throat closing, unable to breath, about to pass out or faint).   Mounjaro 10 MG/0.5ML Pen Generic drug:  tirzepatide Inject 10 mg into the skin once a week.   ondansetron 4 MG disintegrating tablet Commonly known as: ZOFRAN-ODT Take 1 tablet (4 mg total) by mouth every 8 (eight) hours as needed for nausea or vomiting.   SUMAtriptan 50 MG tablet Commonly known as: IMITREX Take 1 tablet (50 mg total) by mouth every 2 (two) hours as needed for migraine. May repeat in 2 hours if headache persists or recurs.   traZODone 50 MG tablet Commonly known as: DESYREL Take 0.5-1 tablets (25-50 mg total) by mouth at bedtime as needed for sleep.   tretinoin 0.025 % cream Commonly known as: RETIN-A Apply 1/2 dime sized amount on cheeks and chin nightly to tolerance.   Vitamin D (Ergocalciferol) 1.25 MG (50000 UNIT) Caps capsule Commonly known as: DRISDOL Take 1 capsule (50,000 Units total) by mouth every 7 (seven) days.        Allergies:  Allergies  Allergen Reactions   Codeine Shortness Of Breath and Other (See Comments)    Hyperventilation, pass out, loss off consciousness     Clindamycin Nausea And Vomiting   Penicillin G Rash    Past Medical History, Surgical history, Social history, and Family History were reviewed and updated.  Review of Systems: All other 10 point review of systems is negative.   Physical Exam:  vitals were not taken for this visit.   Wt Readings from Last 3 Encounters:  04/26/23  158 lb (71.7 kg)  03/22/23 158 lb (71.7 kg)  03/08/23 158 lb (71.7 kg)    Ocular: Sclerae unicteric, pupils equal, round and reactive to light Ear-nose-throat: Oropharynx clear, dentition fair Lymphatic: No cervical or supraclavicular adenopathy Lungs no rales or rhonchi, good excursion bilaterally Heart regular rate and rhythm, no murmur appreciated Abd soft, nontender, positive bowel sounds MSK no focal spinal tenderness, no joint edema Neuro: non-focal, well-oriented, appropriate affect   Lab Results  Component Value Date   WBC 3.9 (L) 04/26/2023   HGB 11.9 (L) 04/26/2023    HCT 36.2 04/26/2023   MCV 94.5 04/26/2023   PLT 308 04/26/2023   Lab Results  Component Value Date   FERRITIN 10 (L) 12/14/2022   IRON 44 12/14/2022   TIBC 351 12/14/2022   UIBC 307 12/14/2022   IRONPCTSAT 13 (L) 12/14/2022   Lab Results  Component Value Date   RETICCTPCT 1.6 04/26/2023   RBC 3.84 (L) 04/26/2023   RBC 3.83 (L) 04/26/2023   No results found for: "KPAFRELGTCHN", "LAMBDASER", "KAPLAMBRATIO" No results found for: "IGGSERUM", "IGA", "IGMSERUM" No results found for: "TOTALPROTELP", "ALBUMINELP", "A1GS", "A2GS", "BETS", "BETA2SER", "GAMS", "MSPIKE", "SPEI"   Chemistry      Component Value Date/Time   NA 137 12/14/2022 1108   K 4.6 12/14/2022 1108   CL 104 12/14/2022 1108   CO2 23 12/14/2022 1108   BUN 8 12/14/2022 1108   CREATININE 0.92 12/14/2022 1108      Component Value Date/Time   CALCIUM 9.1 12/14/2022 1108   ALKPHOS 45 12/14/2022 1108   AST 16 12/14/2022 1108   ALT 11 12/14/2022 1108   BILITOT 0.5 12/14/2022 1108      Encounter Diagnoses  Name Primary?   Other fatigue    Iron deficiency anemia due to chronic blood loss Yes   Impression and Plan: Shelley Ramirez is a pleasant 42 yo African American female with long history of iron deficiency anemia with heavy cycle.   Adding phosphorus on today's labs given fatigue during iron treatments to check for hypophosphoremia  Today Hgb is  11.9. MCV is 94.5, Platelets are 308. WBC down scantly which may be reactive to nutritional deficiencies. We will monitor.  Iron studies pending. Will replace as needed RTC 2 months APP, labs ( Cbc w/, CMP, iron, ferritin, folate, B12)-[place]  Rushie Chestnut, PA-C 10/2/20242:22 PM

## 2023-04-26 NOTE — Assessment & Plan Note (Signed)
We discussed various options to treat this.  Due to her hx of migraine with aura, would recommend that she avoid estrogen containing medication.  Mirena would be a good option for her.  Will refer to GYN for further evaluation.

## 2023-04-26 NOTE — Patient Instructions (Signed)
You should be contacted about your referral to GYN for your heavy periods.

## 2023-04-26 NOTE — Assessment & Plan Note (Signed)
S/p breast biopsy today. Await biopsy results.

## 2023-04-26 NOTE — Progress Notes (Signed)
Subjective:     Patient ID: Shelley Ramirez, female    DOB: Feb 01, 1981, 42 y.o.   MRN: [id number]  Chief Complaint  Patient presents with   Menstrual Problem    Patient reports of abnormal menstrual  cycle. Vaginal spotting every other day after cycle.     HPI  Discussed the use of AI scribe software for clinical note transcription with the patient, who gave verbal consent to proceed.  History of Present Illness          Patient presents today with complaint of very heavy periods with clotting.  Notes occasional irregular bleeding as well.  She does have a hx of migraine headaches with visual aura at times "spots in vision."    She is followed by hematology for her iron deficiency anemia.  Notes that she is very fatigued after her cycle ends each month.  She would like to  update her pap today.   [facility] which she states she tolerated well.      Health Maintenance Due  Topic Date Due   HIV Screening  Never done   DTaP/Tdap/Td (1 - Tdap) Never done   Cervical Cancer Screening (HPV/Pap Cotest)  Never done   COVID-19 Vaccine (1 - 2023-24 season) Never done    Past Medical History:  Diagnosis Date   Acid reflux    Anemia    Lactose intolerance    Other fatigue    SOB (shortness of breath) on exertion    Vitamin D deficiency     Past Surgical History:  Procedure Laterality Date   BREAST BIOPSY Left 04/26/2023   [place] LT BREAST BX W LOC DEV 1ST LESION IMG BX SPEC US GUIDE 04/26/2023 [facility]   CESAREAN SECTION     14years ago    Family History  Problem Relation Age of Onset   Hypertension Mother    Kidney disease Mother    Sudden death Mother        died from allergic reaction to medication   Stroke Father        x 2   Hypertension Father    Alcoholism Father    Fibroids Sister        s/p hysterectomy   Diabetes Mellitus II Maternal Grandmother    Cancer Maternal Aunt    Diabetes Maternal Aunt      Social History   Socioeconomic History   Marital status: Married    Spouse name: Not on file   Number of children: Not on file   Years of education: Not on file   Highest education level: Associate degree: academic program  Occupational History   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  Tobacco Use   Smoking status: Never   Smokeless tobacco: Never  Vaping Use   Vaping status: Never Used  Substance and Sexual Activity   Alcohol use: No   Drug use: No   Sexual activity: Yes    Partners: Male    Comment: not on birth control  Other Topics Concern   Not on file  Social History Narrative   Right handed    Caffeine 1 daily   Lives in two story home , with husband and brother   She owns a substance abuse counseling business in [place]    Married   One son born 1999- lives in [place], he is a Runner, broadcasting/film/video   Has one step son and  one step daughter- both adults   Completed bachelors degree   Enjoys zumba, church, reading   No pets   Social Determinants of Health   Financial Resource Strain: Low Risk  (04/26/2023)   Overall Financial Resource Strain (CARDIA)    Difficulty of Paying Living Expenses: Not hard at all  Food Insecurity: No Food Insecurity (04/26/2023)   Hunger Vital Sign    Worried About Running Out of Food in the Last Year: Never true    Ran Out of Food in the Last Year: Never true  Transportation Needs: No Transportation Needs (04/26/2023)   PRAPARE - Administrator, Civil Service (Medical): No    Lack of Transportation (Non-Medical): No  Physical Activity: Insufficiently Active (04/26/2023)   Exercise Vital Sign    Days of Exercise per Week: 1 day    Minutes of Exercise per Session: 10 min  Stress: No Stress Concern Present (04/26/2023)   [employer] of Occupational Health - Occupational Stress Questionnaire    Feeling of Stress : Not at all  Social Connections: Socially Integrated (04/26/2023)   Social Connection and  Isolation Panel [NHANES]    Frequency of Communication with Friends and Family: More than three times a week    Frequency of Social Gatherings with Friends and Family: Twice a week    Attends Religious Services: More than 4 times per year    Active Member of [employer] or Organizations: Yes    Attends Engineer, structural: More than 4 times per year    Marital Status: Married  Catering manager Violence: Not on file    Outpatient Medications Prior to Visit  Medication Sig Dispense Refill   Clindamycin-Benzoyl Per, Refr, gel Apply to face every other morning 45 g 11   EPINEPHrine (EPIPEN 2-PAK) 0.3 mg/0.3 mL IJ SOAJ injection Inject 0.3 mg into the muscle as needed for anaphylaxis. Use in event of severe allergic reaction (throat closing, unable to breath, about to pass out or faint). 2 each 1   ondansetron (ZOFRAN-ODT) 4 MG disintegrating tablet Take 1 tablet (4 mg total) by mouth every 8 (eight) hours as needed for nausea or vomiting. 30 tablet 0   SUMAtriptan (IMITREX) 50 MG tablet Take 1 tablet (50 mg total) by mouth every 2 (two) hours as needed for migraine. May repeat in 2 hours if headache persists or recurs. 30 tablet 0   tirzepatide (MOUNJARO) 10 MG/0.5ML Pen Inject 10 mg into the skin once a week. 2 mL 0   traZODone (DESYREL) 50 MG tablet Take 0.5-1 tablets (25-50 mg total) by mouth at bedtime as needed for sleep. 30 tablet 3   tretinoin (RETIN-A) 0.025 % cream Apply 1/2 dime sized amount on cheeks and chin nightly to tolerance. 45 g 3   Vitamin D, Ergocalciferol, (DRISDOL) 1.25 MG (50000 UNIT) CAPS capsule Take 1 capsule (50,000 Units total) by mouth every 7 (seven) days. 8 capsule 0   No facility-administered medications prior to visit.    Allergies  Allergen Reactions   Codeine Shortness Of Breath and Other (See Comments)    Hyperventilation, pass out, loss off consciousness     Clindamycin Nausea And Vomiting   Penicillin G Rash    ROS    See HPI  Objective:     Physical Exam Exam conducted with a chaperone present.  Constitutional:      Appearance: Normal appearance.  Genitourinary:    General: Normal vulva.     Labia:  Right: No rash.        Left: No rash.      Vagina: Vaginal discharge (thin mucous discharge) present.     Cervix: Normal.     Uterus: Normal.      Adnexa: Right adnexa normal and left adnexa normal.  Skin:    General: Skin is warm and dry.  Neurological:     General: No focal deficit present.     Mental Status: She is alert and oriented to person, place, and time.  Psychiatric:        Mood and Affect: Mood normal.        Behavior: Behavior normal.        Thought Content: Thought content normal.        Judgment: Judgment normal.      BP 105/63 (BP Location: Right Arm, Patient Position: Sitting, Cuff Size: Small)   Pulse 89   Temp 98.4 F (36.9 C) (Oral)   Resp 16   Wt 158 lb (71.7 kg)   LMP 04/08/2023 (Approximate)   SpO2 100%   BMI 29.85 kg/m  Wt Readings from Last 3 Encounters:  04/26/23 158 lb (71.7 kg)  03/22/23 158 lb (71.7 kg)  03/08/23 158 lb (71.7 kg)       Assessment & Plan:   Problem List Items Addressed This Visit       Unprioritized   Vitamin D deficiency    On supplement which is being managed by her weight loss provider.       Menorrhagia with irregular cycle - Primary    We discussed various options to treat this.  Due to her hx of migraine with aura, would recommend that she avoid estrogen containing medication.  Mirena would be a good option for her.  Will refer to GYN for further evaluation.       Relevant Orders   Ambulatory referral to Obstetrics / Gynecology   Iron deficiency anemia    Receiving iron infusions per hematology.  Has upcoming blood work scheduled.       Encounter for routine gynecological examination with Papanicolaou smear of cervix    Pap performed today and will be sent for high risk HPV testing.       Relevant Orders   Cytology - PAP( [facility])   Abnormal mammogram    S/p breast biopsy today. Await biopsy results.        I am having Aleesa Ogburn Graves maintain her EPINEPHrine, Clindamycin-Benzoyl Per (Refr), tretinoin, ondansetron, Vitamin D (Ergocalciferol), traZODone, tirzepatide, and SUMAtriptan.  No orders of the defined types were placed in this encounter.

## 2023-04-26 NOTE — Assessment & Plan Note (Signed)
Receiving iron infusions per hematology.  Has upcoming blood work scheduled.

## 2023-04-27 ENCOUNTER — Ambulatory Visit: Payer: 59 | Admitting: Bariatrics

## 2023-04-27 LAB — IRON AND IRON BINDING CAPACITY (CC-WL,HP ONLY)
Iron: 63 ug/dL (ref 28–170)
Saturation Ratios: 22 % (ref 10.4–31.8)
TIBC: 283 ug/dL (ref 250–450)
UIBC: 220 ug/dL (ref 148–442)

## 2023-04-27 LAB — SURGICAL PATHOLOGY

## 2023-05-01 LAB — CYTOLOGY - PAP
Adequacy: ABSENT
Comment: NEGATIVE
Diagnosis: NEGATIVE
High risk HPV: NEGATIVE

## 2023-05-17 ENCOUNTER — Other Ambulatory Visit (HOSPITAL_BASED_OUTPATIENT_CLINIC_OR_DEPARTMENT_OTHER): Payer: Self-pay

## 2023-05-17 ENCOUNTER — Encounter: Payer: Self-pay | Admitting: Family

## 2023-05-17 ENCOUNTER — Encounter: Payer: Self-pay | Admitting: Bariatrics

## 2023-05-17 ENCOUNTER — Ambulatory Visit: Payer: 59 | Admitting: Bariatrics

## 2023-05-17 DIAGNOSIS — E669 Obesity, unspecified: Secondary | ICD-10-CM | POA: Diagnosis not present

## 2023-05-17 DIAGNOSIS — Z6829 Body mass index (BMI) 29.0-29.9, adult: Secondary | ICD-10-CM

## 2023-05-17 DIAGNOSIS — R632 Polyphagia: Secondary | ICD-10-CM

## 2023-05-17 DIAGNOSIS — E559 Vitamin D deficiency, unspecified: Secondary | ICD-10-CM | POA: Diagnosis not present

## 2023-05-17 DIAGNOSIS — E88819 Insulin resistance, unspecified: Secondary | ICD-10-CM

## 2023-05-17 DIAGNOSIS — R110 Nausea: Secondary | ICD-10-CM | POA: Diagnosis not present

## 2023-05-17 MED ORDER — TIRZEPATIDE 10 MG/0.5ML ~~LOC~~ SOAJ
10.0000 mg | SUBCUTANEOUS | 0 refills | Status: DC
  Filled 2023-05-17: qty 2, 28d supply, fill #0

## 2023-05-17 MED ORDER — VITAMIN D3 1.25 MG (50000 UT) PO CAPS
1.0000 | ORAL_CAPSULE | ORAL | 0 refills | Status: DC
  Filled 2023-05-17: qty 4, 28d supply, fill #0

## 2023-05-17 MED ORDER — ONDANSETRON 4 MG PO TBDP
4.0000 mg | ORAL_TABLET | Freq: Three times a day (TID) | ORAL | 0 refills | Status: DC | PRN
Start: 2023-05-17 — End: 2023-07-03
  Filled 2023-05-17: qty 18, 21d supply, fill #0

## 2023-05-17 NOTE — Progress Notes (Signed)
WEIGHT SUMMARY AND BIOMETRICS  Weight Lost Since Last Visit: 2lb  Weight Gained Since Last Visit: 0   Vitals Temp: 97.8 F (36.6 C) BP: 99/69 Pulse Rate: 87 SpO2: 97 %   Anthropometric Measurements Height: 5\' 1"  (1.549 m) Weight: 156 lb (70.8 kg) BMI (Calculated): 29.49 Weight at Last Visit: 158lb Weight Lost Since Last Visit: 2lb Weight Gained Since Last Visit: 0 Starting Weight: 205lb Total Weight Loss (lbs): 49 lb (22.2 kg)   Body Composition  Body Fat %: 35.7 % Fat Mass (lbs): 56 lbs Muscle Mass (lbs): 95.4 lbs Total Body Water (lbs): 69 lbs Visceral Fat Rating : 7   Other Clinical Data Fasting: no Labs: no Today's Visit #: 26 Starting Date: 03/25/21    OBESITY Shelley Ramirez is here to discuss her progress with her obesity treatment plan along with follow-up of her obesity related diagnoses.     Nutrition Plan: the Category 1 plan - 0% adherence.  Current exercise: none  Interim History:  She is down 2 lbs since her last visit. She has been under some stress due to testing.  Protein intake is as prescribed, Is not skipping meals, and Water intake is adequate.  Pharmacotherapy: Alicai is on Mounjaro 10 mg SQ weekly Adverse side effects: None Hunger is moderately controlled.  Cravings are moderately controlled.  Assessment/Plan:   1. Vitamin D deficiency Vitamin D Deficiency Vitamin D is not at goal of 50.  Most recent vitamin D level was 27.6. She is on  prescription ergocalciferol 50,000 IU weekly. Lab Results  Component Value Date   VD25OH 27.6 (L) 12/14/2022   VD25OH 22.0 (L) 04/19/2022   VD25OH 15.0 (L) 10/07/2021    Plan: Refill prescription vitamin D 50,000 IU weekly.   2. Polyphagia .Polyphagia Shelley Ramirez endorses excessive hunger.  Medication(s): mounjaro Effects of medication:  moderately controlled. Cravings are moderately controlled.   Plan: Medication(s): Mounjaro 10 mg SQ weekly Will increase water, protein and fiber  to help assuage hunger.  Will minimize foods that have a high glucose index/load to minimize reactive hypoglycemia.   3. Insulin Resistance Shelley Ramirez has had elevated fasting insulin readings. Goal is HgbA1c < 5.7, fasting insulin at l0 or less, and preferably at 5.  She  denies excessive polyphagia. Medication(s): Mounjaro Lab Results  Component Value Date   HGBA1C 5.1 12/14/2022   Lab Results  Component Value Date   INSULIN 6.5 12/14/2022   INSULIN 17.8 04/19/2022   INSULIN 10.2 10/07/2021   INSULIN 8.5 03/25/2021    Plan Medication(s): Mounjaro 10 mg SQ weekly Will work on the agreed upon plan. Will minimize refined carbohydrates ( sweets and starches), and focus more on complex carbohydrates.  Increase the micronutrients found in leafy greens, which include magnesium, polyphenols, and vitamin C which have been postulated to help with insulin sensitivity. Minimize "fast food" and cook more meals at home.   4. Nausea without vomiting   She is having occasional nausea and needs a Rx for Zofran.   Rx: Zofran 4 mg every 8 hours as needed for nausea #30 with no refills.    Generalized Obesity: Current BMI BMI (Calculated): 29.49   Pharmacotherapy Plan Continue and refill  Mounjaro 10 mg SQ weekly  Shelley Ramirez is currently in the action stage of change. As such, her goal is to continue with weight loss efforts.  She has agreed to the Category 2 plan. She will continue to cook more.   Exercise goals: For additional and more extensive health benefits, adults  should increase their aerobic physical activity to 300 minutes (5 hours) a week of moderate-intensity, or 150 minutes a week of vigorous-intensity aerobic physical activity, or an equivalent combination of moderate- and vigorous-intensity activity. Additional health benefits are gained by engaging in physical activity beyond this amount.   Behavioral modification strategies: increasing lean protein intake, decreasing simple  carbohydrates , no meal skipping, increase water intake, better snacking choices, get rid of junk food in the home, avoiding temptations, and keep healthy foods in the home.  Cedra has agreed to follow-up with our clinic in 4 weeks.   Objective:   VITALS: Per patient if applicable, see vitals. GENERAL: Alert and in no acute distress. CARDIOPULMONARY: No increased WOB. Speaking in clear sentences.  PSYCH: Pleasant and cooperative. Speech normal rate and rhythm. Affect is appropriate. Insight and judgement are appropriate. Attention is focused, linear, and appropriate.  NEURO: Oriented as arrived to appointment on time with no prompting.   Attestation Statements:    This was prepared with the assistance of Engineer, civil (consulting).  Occasional wrong-word or sound-a-like substitutions may have occurred due to the inherent limitations of voice recognition software.   Shelley Ramirez Capra, DO

## 2023-05-26 ENCOUNTER — Other Ambulatory Visit (HOSPITAL_BASED_OUTPATIENT_CLINIC_OR_DEPARTMENT_OTHER): Payer: Self-pay

## 2023-06-15 ENCOUNTER — Ambulatory Visit: Payer: 59 | Admitting: Bariatrics

## 2023-06-16 ENCOUNTER — Encounter: Payer: 59 | Admitting: Obstetrics and Gynecology

## 2023-06-27 ENCOUNTER — Inpatient Hospital Stay: Payer: 59 | Attending: Family

## 2023-06-27 ENCOUNTER — Inpatient Hospital Stay: Payer: 59 | Admitting: Medical Oncology

## 2023-06-28 ENCOUNTER — Ambulatory Visit: Payer: 59 | Admitting: Bariatrics

## 2023-07-03 ENCOUNTER — Encounter: Payer: Self-pay | Admitting: Bariatrics

## 2023-07-03 ENCOUNTER — Encounter: Payer: Self-pay | Admitting: Family

## 2023-07-03 ENCOUNTER — Other Ambulatory Visit (HOSPITAL_BASED_OUTPATIENT_CLINIC_OR_DEPARTMENT_OTHER): Payer: Self-pay

## 2023-07-03 ENCOUNTER — Ambulatory Visit: Payer: 59 | Admitting: Bariatrics

## 2023-07-03 DIAGNOSIS — E669 Obesity, unspecified: Secondary | ICD-10-CM

## 2023-07-03 DIAGNOSIS — R632 Polyphagia: Secondary | ICD-10-CM | POA: Diagnosis not present

## 2023-07-03 DIAGNOSIS — R110 Nausea: Secondary | ICD-10-CM

## 2023-07-03 DIAGNOSIS — Z6828 Body mass index (BMI) 28.0-28.9, adult: Secondary | ICD-10-CM

## 2023-07-03 DIAGNOSIS — E88819 Insulin resistance, unspecified: Secondary | ICD-10-CM

## 2023-07-03 MED ORDER — ONDANSETRON 4 MG PO TBDP
4.0000 mg | ORAL_TABLET | Freq: Three times a day (TID) | ORAL | 0 refills | Status: DC | PRN
Start: 2023-07-03 — End: 2023-09-20
  Filled 2023-07-03: qty 18, 21d supply, fill #0
  Filled 2023-08-11: qty 18, 6d supply, fill #0

## 2023-07-03 MED ORDER — TIRZEPATIDE 10 MG/0.5ML ~~LOC~~ SOAJ
10.0000 mg | SUBCUTANEOUS | 0 refills | Status: DC
  Filled 2023-07-03 – 2023-08-11 (×3): qty 2, 28d supply, fill #0

## 2023-07-03 MED ORDER — VITAMIN D3 1.25 MG (50000 UT) PO CAPS
1.0000 | ORAL_CAPSULE | ORAL | 0 refills | Status: DC
Start: 2023-07-03 — End: 2023-08-23
  Filled 2023-07-03 – 2023-08-11 (×2): qty 5, 35d supply, fill #0

## 2023-07-03 NOTE — Progress Notes (Signed)
WEIGHT SUMMARY AND BIOMETRICS  Weight Lost Since Last Visit: 5lb  Weight Gained Since Last Visit: 0   Vitals Temp: 97.8 F (36.6 C) BP: 113/76 Pulse Rate: 99 SpO2: 100 %   Anthropometric Measurements Height: 5\' 1"  (1.549 m) Weight: 151 lb (68.5 kg) BMI (Calculated): 28.55 Weight at Last Visit: 156lb Weight Lost Since Last Visit: 5lb Weight Gained Since Last Visit: 0 Starting Weight: 205lb Total Weight Loss (lbs): 54 lb (24.5 kg)   Body Composition  Body Fat %: 32.4 % Fat Mass (lbs): 49 lbs Muscle Mass (lbs): 96.8 lbs Total Body Water (lbs): 67 lbs Visceral Fat Rating : 6   Other Clinical Data Fasting: no Labs: no Today's Visit #: 21 Starting Date: 03/25/21    OBESITY Shelley Ramirez is here to discuss her progress with her obesity treatment plan along with follow-up of her obesity related diagnoses.    Nutrition Plan: the Category 1 plan - 85% adherence.  Current exercise: none  Interim History:  She is down an additional 5 lbs since her last visit.  Eating all of the food on the plan., Protein intake is as prescribed, Is not skipping meals, Meeting calorie goals., and Water intake is adequate.   Pharmacotherapy: Shelley Ramirez is on Mounjaro 10 mg SQ weekly Adverse side effects: Nausea Hunger is moderately controlled.  Cravings are moderately controlled.  Assessment/Plan:   1. Polyphagia Polyphagia Shelley Ramirez endorses excessive hunger.  Medication(s): Mounjaro Effects of medication:  moderately controlled. Cravings are moderately controlled.   Plan: Medication(s): Mounjaro 10 mg SQ weekly Will increase water, protein and fiber to help assuage hunger.  Will minimize foods that have a high glucose index/load to minimize reactive hypoglycemia.    2. Insulin resistance Insulin Resistance Shelley Ramirez has had elevated fasting insulin readings. Goal is HgbA1c <  5.7, fasting insulin at l0 or less, and preferably at 5.  She denies polyphagia. Medication(s): Mounjaro 10 mg Lab Results  Component Value Date   HGBA1C 5.1 12/14/2022   Lab Results  Component Value Date   INSULIN 6.5 12/14/2022   INSULIN 17.8 04/19/2022   INSULIN 10.2 10/07/2021   INSULIN 8.5 03/25/2021    Plan Medication(s): Mounjaro 10 mg SQ weekly Will work on the agreed upon plan. Will minimize refined carbohydrates ( sweets and starches), and focus more on complex carbohydrates.  Increase the micronutrients found in leafy greens, which include magnesium, polyphenols, and vitamin C which have been postulated to help with insulin sensitivity. Minimize "fast food" and cook more meals at home.  Increase fiber to 25 to 30 grams daily.    3. Nausea without vomiting  She has some mild nausea occasionally with the [facility], but much improved.   Plan: See Rx for Zofran.    Generalized Obesity: Current BMI BMI (Calculated): 28.55   Pharmacotherapy Plan Continue and refill  Mounjaro 10 mg SQ weekly  Shelley Ramirez is  currently in the action stage of change. As such, her goal is to continue with weight loss efforts.  She has agreed to the Category 1 plan.  Exercise goals: For substantial health benefits, adults should do at least 150 minutes (2 hours and 30 minutes) a week of moderate-intensity, or 75 minutes (1 hour and 15 minutes) a week of vigorous-intensity aerobic physical activity, or an equivalent combination of moderate- and vigorous-intensity aerobic activity. Aerobic activity should be performed in episodes of at least 10 minutes, and preferably, it should be spread throughout the week.  Behavioral modification strategies: increasing lean protein intake, meal planning , planning for success, increasing vegetables, avoiding temptations, and mindful eating.  Shelley Ramirez has agreed to follow-up with our clinic in 4 weeks.      Objective:   VITALS: Per patient if applicable, see  vitals. GENERAL: Alert and in no acute distress. CARDIOPULMONARY: No increased WOB. Speaking in clear sentences.  PSYCH: Pleasant and cooperative. Speech normal rate and rhythm. Affect is appropriate. Insight and judgement are appropriate. Attention is focused, linear, and appropriate.  NEURO: Oriented as arrived to appointment on time with no prompting.   Attestation Statements:   This was prepared with the assistance of Engineer, civil (consulting).  Occasional wrong-word or sound-a-like substitutions may have occurred due to the inherent limitations of voice recognition   Corinna Capra, DO

## 2023-07-13 ENCOUNTER — Other Ambulatory Visit (HOSPITAL_BASED_OUTPATIENT_CLINIC_OR_DEPARTMENT_OTHER): Payer: Self-pay

## 2023-08-03 ENCOUNTER — Ambulatory Visit: Payer: 59 | Admitting: Bariatrics

## 2023-08-11 ENCOUNTER — Encounter: Payer: Self-pay | Admitting: Family

## 2023-08-11 ENCOUNTER — Other Ambulatory Visit (HOSPITAL_BASED_OUTPATIENT_CLINIC_OR_DEPARTMENT_OTHER): Payer: Self-pay

## 2023-08-11 ENCOUNTER — Encounter (HOSPITAL_BASED_OUTPATIENT_CLINIC_OR_DEPARTMENT_OTHER): Payer: Self-pay

## 2023-08-11 ENCOUNTER — Other Ambulatory Visit: Payer: Self-pay | Admitting: Bariatrics

## 2023-08-11 DIAGNOSIS — E88819 Insulin resistance, unspecified: Secondary | ICD-10-CM

## 2023-08-11 DIAGNOSIS — R632 Polyphagia: Secondary | ICD-10-CM

## 2023-08-23 ENCOUNTER — Ambulatory Visit: Payer: 59 | Admitting: Bariatrics

## 2023-08-23 ENCOUNTER — Other Ambulatory Visit (HOSPITAL_BASED_OUTPATIENT_CLINIC_OR_DEPARTMENT_OTHER): Payer: Self-pay

## 2023-08-23 ENCOUNTER — Encounter: Payer: Self-pay | Admitting: Bariatrics

## 2023-08-23 VITALS — BP 103/74 | HR 84 | Temp 97.8°F | Ht 61.0 in | Wt 144.0 lb

## 2023-08-23 DIAGNOSIS — R5383 Other fatigue: Secondary | ICD-10-CM

## 2023-08-23 DIAGNOSIS — E88819 Insulin resistance, unspecified: Secondary | ICD-10-CM | POA: Diagnosis not present

## 2023-08-23 DIAGNOSIS — E7800 Pure hypercholesterolemia, unspecified: Secondary | ICD-10-CM

## 2023-08-23 DIAGNOSIS — Z6827 Body mass index (BMI) 27.0-27.9, adult: Secondary | ICD-10-CM | POA: Diagnosis not present

## 2023-08-23 DIAGNOSIS — E785 Hyperlipidemia, unspecified: Secondary | ICD-10-CM | POA: Diagnosis not present

## 2023-08-23 DIAGNOSIS — E559 Vitamin D deficiency, unspecified: Secondary | ICD-10-CM

## 2023-08-23 DIAGNOSIS — E669 Obesity, unspecified: Secondary | ICD-10-CM | POA: Diagnosis not present

## 2023-08-23 DIAGNOSIS — R632 Polyphagia: Secondary | ICD-10-CM | POA: Diagnosis not present

## 2023-08-23 MED ORDER — TIRZEPATIDE 10 MG/0.5ML ~~LOC~~ SOAJ
10.0000 mg | SUBCUTANEOUS | 0 refills | Status: DC
  Filled 2023-08-23: qty 2, 28d supply, fill #0

## 2023-08-23 MED ORDER — VITAMIN D3 1.25 MG (50000 UT) PO CAPS
1.0000 | ORAL_CAPSULE | ORAL | 0 refills | Status: DC
  Filled 2023-08-23: qty 5, 35d supply, fill #0

## 2023-08-23 NOTE — Progress Notes (Signed)
WEIGHT SUMMARY AND BIOMETRICS  Weight Lost Since Last Visit: 7lb  Weight Gained Since Last Visit: 0   Vitals Temp: 97.8 F (36.6 C) BP: 103/74 Pulse Rate: 84 SpO2: 100 %   Anthropometric Measurements Height: 5\' 1"  (1.549 m) Weight: 144 lb (65.3 kg) BMI (Calculated): 27.22 Weight at Last Visit: 151lb Weight Lost Since Last Visit: 7lb Weight Gained Since Last Visit: 0 Starting Weight: 205lb Total Weight Loss (lbs): 61 lb (27.7 kg)   Body Composition  Body Fat %: 31.2 % Fat Mass (lbs): 45 lbs Muscle Mass (lbs): 94.2 lbs Total Body Water (lbs): 65.4 lbs Visceral Fat Rating : 6   Other Clinical Data Fasting: yes Labs: yes Today's Visit #: 28 Starting Date: 03/25/21    OBESITY Shelley Ramirez is here to discuss her progress with her obesity treatment plan along with follow-up of her obesity related diagnoses.    Nutrition Plan: the Category 1 plan - 0% adherence. (She  currently doing a fast for church.)  Current exercise: none  Interim History:  She is down another 7 lbs since her last visit.  She states that she is doing well.  Eating all of the food on the plan., Protein intake is as prescribed, Meeting calorie goals., Water intake is adequate., and Denies polyphagia   Pharmacotherapy: Shelley Ramirez is on Mounjaro 10 mg SQ weekly Adverse side effects: None Hunger is moderately controlled.  Cravings are moderately controlled.  Assessment/Plan:    Polyphagia Shelley Ramirez endorses excessive hunger.  Medication(s): Mounjaro Effects of medication:  moderately controlled. Cravings are moderately controlled.   Plan: Medication(s): Mounjaro 10 mg SQ weekly Will increase water, protein and fiber to help assuage hunger.  Will minimize foods that have a high glucose index/load to minimize reactive hypoglycemia.   Hyperlipidemia:   History of elevated lipids, but not  on any medications.  She is working on diet and exercise  Plan: Will check lipids.  Continue diet and exercise.    Vitamin D deficiency:   Taking vitamin D prescription/OTC  Plan: Continue vitamin D. Will check vitamin D.   Insulin Resistance Shelley Ramirez has had elevated fasting insulin readings. Goal is HgbA1c < 5.7, fasting insulin at l0 or less, and preferably at 5.  She denies polyphagia at this time while taking her Mounjaro  Medication(s): Mounjaro 10 mg.  Lab Results  Component Value Date   HGBA1C 5.1 12/14/2022   Lab Results  Component Value Date   INSULIN 6.5 12/14/2022   INSULIN 17.8 04/19/2022   INSULIN 10.2 10/07/2021   INSULIN 8.5 03/25/2021    Plan Medication(s): Mounjaro 10 mg SQ weekly Will work on the agreed upon plan. Will minimize refined carbohydrates ( sweets and starches), and focus more on complex carbohydrates.  Increase the micronutrients found in leafy greens, which include magnesium, polyphenols, and vitamin C which have been postulated to help with insulin sensitivity. Minimize "  fast food" and cook more meals at home.  Increase fiber to 25 to 30 grams daily.     Generalized Obesity: Current BMI BMI (Calculated): 27.22   Pharmacotherapy Plan Continue and refill  Mounjaro 10 mg SQ weekly  Shelley Ramirez is currently in the action stage of change. As such, her goal is to continue with weight loss efforts.  She has agreed to the Category 1 plan.  Exercise goals: For substantial health benefits, adults should do at least 150 minutes (2 hours and 30 minutes) a week of moderate-intensity, or 75 minutes (1 hour and 15 minutes) a week of vigorous-intensity aerobic physical activity, or an equivalent combination of moderate- and vigorous-intensity aerobic activity. Aerobic activity should be performed in episodes of at least 10 minutes, and preferably, it should be spread throughout the week.  Behavioral modification strategies: increasing lean protein intake, no meal  skipping, decrease eating out, meal planning , better snacking choices, planning for success, increasing vegetables, increasing lower sugar fruits, increasing fiber rich foods, emotional eating strategies, avoiding temptations, and mindful eating.  Shelley Ramirez has agreed to follow-up with our clinic in 4 weeks.     Labs done today (CMP, Lipids, HgbA1c, insulin, vitamin D, anemia panel, ferritin and a thyroid panel ).   Objective:   VITALS: Per patient if applicable, see vitals. GENERAL: Alert and in no acute distress. CARDIOPULMONARY: No increased WOB. Speaking in clear sentences.  PSYCH: Pleasant and cooperative. Speech normal rate and rhythm. Affect is appropriate. Insight and judgement are appropriate. Attention is focused, linear, and appropriate.  NEURO: Oriented as arrived to appointment on time with no prompting.   Attestation Statements:    This was prepared with the assistance of Engineer, civil (consulting).  Occasional wrong-word or sound-a-like substitutions may have occurred due to the inherent limitations of voice recognition   Shelley Capra, DO

## 2023-08-24 LAB — COMPREHENSIVE METABOLIC PANEL
ALT: 10 [IU]/L (ref 0–32)
AST: 16 [IU]/L (ref 0–40)
Albumin: 4.4 g/dL (ref 3.9–4.9)
Alkaline Phosphatase: 46 [IU]/L (ref 44–121)
BUN/Creatinine Ratio: 6 — ABNORMAL LOW (ref 9–23)
BUN: 5 mg/dL — ABNORMAL LOW (ref 6–24)
Bilirubin Total: 0.6 mg/dL (ref 0.0–1.2)
CO2: 22 mmol/L (ref 20–29)
Calcium: 9.1 mg/dL (ref 8.7–10.2)
Chloride: 105 mmol/L (ref 96–106)
Creatinine, Ser: 0.85 mg/dL (ref 0.57–1.00)
Globulin, Total: 2.6 g/dL (ref 1.5–4.5)
Glucose: 74 mg/dL (ref 70–99)
Potassium: 4.2 mmol/L (ref 3.5–5.2)
Sodium: 141 mmol/L (ref 134–144)
Total Protein: 7 g/dL (ref 6.0–8.5)
eGFR: 87 mL/min/{1.73_m2} (ref >59)

## 2023-08-24 LAB — LIPID PANEL WITH LDL/HDL RATIO
Cholesterol, Total: 167 mg/dL (ref 100–199)
HDL: 55 mg/dL (ref >39)
LDL Chol Calc (NIH): 98 mg/dL (ref 0–99)
LDL/HDL Ratio: 1.8 {ratio} (ref 0.0–3.2)
Triglycerides: 73 mg/dL (ref 0–149)
VLDL Cholesterol Cal: 14 mg/dL (ref 5–40)

## 2023-08-24 LAB — ANEMIA PANEL
Ferritin: 42 ng/mL (ref 15–150)
Folate, Hemolysate: 217 ng/mL
Folate, RBC: 545 ng/mL (ref >498)
Hematocrit: 39.8 % (ref 34.0–46.6)
Iron Saturation: 16 % (ref 15–55)
Iron: 49 ug/dL (ref 27–159)
Retic Ct Pct: 1 % (ref 0.6–2.6)
Total Iron Binding Capacity: 308 ug/dL (ref 250–450)
UIBC: 259 ug/dL (ref 131–425)
Vitamin B-12: 251 pg/mL (ref 232–1245)

## 2023-08-24 LAB — TSH+T4F+T3FREE
Free T4: 1.01 ng/dL (ref 0.82–1.77)
T3, Free: 2.9 pg/mL (ref 2.0–4.4)
TSH: 3.16 u[IU]/mL (ref 0.450–4.500)

## 2023-08-24 LAB — HEMOGLOBIN A1C
Est. average glucose Bld gHb Est-mCnc: 97 mg/dL
Hgb A1c MFr Bld: 5 % (ref 4.8–5.6)

## 2023-08-24 LAB — INSULIN, RANDOM: INSULIN: 7.5 u[IU]/mL (ref 2.6–24.9)

## 2023-08-24 LAB — VITAMIN D 25 HYDROXY (VIT D DEFICIENCY, FRACTURES): Vit D, 25-Hydroxy: 34.2 ng/mL (ref 30.0–100.0)

## 2023-09-01 ENCOUNTER — Ambulatory Visit: Payer: 59 | Admitting: Family Medicine

## 2023-09-04 ENCOUNTER — Ambulatory Visit: Payer: 59 | Admitting: Family

## 2023-09-04 VITALS — BP 103/68 | HR 86 | Temp 99.2°F | Resp 16 | Ht 61.0 in | Wt 151.0 lb

## 2023-09-04 DIAGNOSIS — R300 Dysuria: Secondary | ICD-10-CM

## 2023-09-04 DIAGNOSIS — G43909 Migraine, unspecified, not intractable, without status migrainosus: Secondary | ICD-10-CM

## 2023-09-04 LAB — POC URINALSYSI DIPSTICK (AUTOMATED)
Blood, UA: NEGATIVE
Glucose, UA: NEGATIVE
Nitrite, UA: NEGATIVE
Protein, UA: POSITIVE — AB
Spec Grav, UA: 1.015 (ref 1.010–1.025)
Urobilinogen, UA: 0.2 U/dL
pH, UA: 7 (ref 5.0–8.0)

## 2023-09-04 MED ORDER — SUMATRIPTAN SUCCINATE 50 MG PO TABS: 50.0000 mg | ORAL_TABLET | ORAL | 0 refills | Status: DC | PRN

## 2023-09-04 NOTE — Assessment & Plan Note (Signed)
 Stable, requesting refill of imitrex .

## 2023-09-04 NOTE — Patient Instructions (Signed)
 VISIT SUMMARY:  Today, we discussed your recent urinary discomfort and reviewed your urinalysis results. We also talked about your recent blood workup and your current medication regimen.  YOUR PLAN:  -URINARY DISCOMFORT: You experienced mild discomfort during urination last week, which has since improved. Your urinalysis showed a few white blood cells, which might indicate a possible infection. We will send your urine for a culture test to check for any bacterial infection. If bacteria are found, we will start you on an antibiotic treatment.INSTRUCTIONS:  Please monitor your MyChart for the urine culture results, which are expected on 09/06/2023. If an infection is confirmed, we will prescribe the appropriate antibiotic.

## 2023-09-04 NOTE — Assessment & Plan Note (Signed)
  Reported discomfort during urination last week, which has since resolved. Urinalysis showed a few white blood cells, indicating possible infection. -Send urine for culture. -If culture grows bacteria, start antibiotic treatment.

## 2023-09-04 NOTE — Progress Notes (Signed)
 Subjective:     Patient ID: Shelley Ramirez, female    DOB: 12-23-1980, 43 y.o.   MRN: [id number]  Chief Complaint  Patient presents with   Dysuria    Patient complains of discomfort with urination    HPI  Discussed the use of AI scribe software for clinical note transcription with the patient, who gave verbal consent to proceed.  History of Present Illness   Shelley Ramirez is a 43 year old female who presents with urinary discomfort and abnormal urinalysis findings.  She has been experiencing mild urinary discomfort for approximately one week, primarily during urination. She has been managing the symptoms by increasing her water intake and drinking cranberry juice, which she feels has helped alleviate the discomfort. No visible hematuria or fever has been noted.  A recent urinalysis showed a few white blood cells, but the results were not definitive.  She mentions a recent blood workup done at [facility], which indicated a low BUN and creatinine ratio. She attributes this to dietary changes, specifically fasting and not consuming meat during January. Her protein levels were in the low normal range, which she also associates with her fasting period.  She is currently taking Mounjaro , a medication she pays for out of pocket after her insurance stopped covering it. The cost is approximately $500.     Wt Readings from Last 3 Encounters:  09/04/23 151 lb (68.5 kg)  08/23/23 144 lb (65.3 kg)  07/03/23 151 lb (68.5 kg)        Health Maintenance Due  Topic Date Due   HIV Screening  Never done   DTaP/Tdap/Td (1 - Tdap) Never done   COVID-19 Vaccine (1 - 2024-25 season) Never done    Past Medical History:  Diagnosis Date   Acid reflux    Anemia    Lactose intolerance    Other fatigue    SOB (shortness of breath) on exertion    Vitamin D  deficiency     Past Surgical History:  Procedure Laterality Date   BREAST BIOPSY Left 04/26/2023   US  LT  BREAST BX W LOC DEV 1ST LESION IMG BX SPEC US  GUIDE 04/26/2023 [facility]   CESAREAN SECTION     14years ago    Family History  Problem Relation Age of Onset   Hypertension Mother    Kidney disease Mother    Sudden death Mother        died from allergic reaction to medication   Stroke Father        x 2   Hypertension Father    Alcoholism Father    Fibroids Sister        s/p hysterectomy   Diabetes Mellitus II Maternal Grandmother    Cancer Maternal Aunt    Diabetes Maternal Aunt     Social History   Socioeconomic History   Marital status: Married    Spouse name: Not on file   Number of children: Not on file   Years of education: Not on file   Highest education level: Associate degree: academic program  Occupational History   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  Tobacco Use   Smoking status: Never   Smokeless tobacco: Never  Vaping Use   Vaping status: Never Used  Substance and Sexual Activity   Alcohol use: No   Drug use: No   Sexual activity: Yes    Partners: Male    Comment: not on birth control  Other Topics Concern   Not on file  Social History Narrative   Right handed    Caffeine 1 daily   Lives in two story home , with husband and brother   She owns a substance abuse counseling business in [place]    Married   One son born 1999- lives in [place], he is a Runner, broadcasting/film/video   Has one step son and one step daughter- both adults   Completed bachelors degree   Enjoys zumba, church, reading   No pets   Social Drivers of Corporate investment banker Strain: Low Risk  (04/26/2023)   Overall Financial Resource Strain (CARDIA)    Difficulty of Paying Living Expenses: Not hard at all  Food Insecurity: No Food Insecurity (04/26/2023)   Hunger Vital Sign    Worried About Running Out of Food in the Last Year: Never true    Ran Out of Food in the Last Year: Never true  Transportation Needs: No Transportation Needs (04/26/2023)    PRAPARE - Administrator, Civil Service (Medical): No    Lack of Transportation (Non-Medical): No  Physical Activity: Insufficiently Active (04/26/2023)   Exercise Vital Sign    Days of Exercise per Week: 1 day    Minutes of Exercise per Session: 10 min  Stress: No Stress Concern Present (04/26/2023)   [employer] of Occupational Health - Occupational Stress Questionnaire    Feeling of Stress : Not at all  Social Connections: Socially Integrated (04/26/2023)   Social Connection and Isolation Panel [NHANES]    Frequency of Communication with Friends and Family: More than three times a week    Frequency of Social Gatherings with Friends and Family: Twice a week    Attends Religious Services: More than 4 times per year    Active Member of [employer] or Organizations: Yes    Attends Engineer, structural: More than 4 times per year    Marital Status: Married  Catering manager Violence: Not on file    Outpatient Medications Prior to Visit  Medication Sig Dispense Refill   Cholecalciferol  (VITAMIN D3) 1.25 MG (50000 UT) CAPS Take 1 capsule (1.25 mg total) by mouth every 7 (seven) days. 5 capsule 0   Clindamycin -Benzoyl Per, Refr, gel Apply to face every other morning 45 g 11   EPINEPHrine  (EPIPEN  2-PAK) 0.3 mg/0.3 mL IJ SOAJ injection Inject 0.3 mg into the muscle as needed for anaphylaxis. Use in event of severe allergic reaction (throat closing, unable to breath, about to pass out or faint). 2 each 1   ondansetron  (ZOFRAN -ODT) 4 MG disintegrating tablet Take 1 tablet (4 mg total) by mouth every 8 (eight) hours as needed for nausea or vomiting. 30 tablet 0   tirzepatide  (MOUNJARO ) 10 MG/0.5ML Pen Inject 10 mg into the skin once a week. 2 mL 0   traZODone  (DESYREL ) 50 MG tablet Take 0.5-1 tablets (25-50 mg total) by mouth at bedtime as needed for sleep. 30 tablet 3   tretinoin  (RETIN-A ) 0.025 % cream Apply 1/2 dime sized amount on cheeks and chin nightly to tolerance. 45 g 3    SUMAtriptan  (IMITREX ) 50 MG tablet Take 1 tablet (50 mg total) by mouth every 2 (two) hours as needed for migraine. May repeat in 2 hours if headache persists or recurs. 30 tablet 0   No facility-administered medications prior to visit.    Allergies  Allergen Reactions   Codeine Shortness Of Breath and Other (See Comments)    Hyperventilation, pass  out, loss off consciousness     Clindamycin  Nausea And Vomiting   Penicillin G Rash    ROS    See HPI Objective:    Physical Exam Constitutional:      General: She is not in acute distress.    Appearance: Normal appearance. She is well-developed.  HENT:     Head: Normocephalic and atraumatic.     Right Ear: External ear normal.     Left Ear: External ear normal.  Eyes:     General: No scleral icterus. Neck:     Thyroid : No thyromegaly.  Cardiovascular:     Rate and Rhythm: Normal rate and regular rhythm.     Heart sounds: Normal heart sounds. No murmur heard. Pulmonary:     Effort: Pulmonary effort is normal. No respiratory distress.     Breath sounds: Normal breath sounds. No wheezing.  Musculoskeletal:        General: No swelling.     Cervical back: Neck supple.  Skin:    General: Skin is warm and dry.  Neurological:     Mental Status: She is alert and oriented to person, place, and time.  Psychiatric:        Mood and Affect: Mood normal.        Behavior: Behavior normal.        Thought Content: Thought content normal.        Judgment: Judgment normal.      BP 103/68 (BP Location: Right Arm, Patient Position: Sitting, Cuff Size: Small)   Pulse 86   Temp 99.2 F (37.3 C) (Oral)   Resp 16   Ht 5\' 1"  (1.549 m)   Wt 151 lb (68.5 kg)   SpO2 100%   BMI 28.53 kg/m  Wt Readings from Last 3 Encounters:  09/04/23 151 lb (68.5 kg)  08/23/23 144 lb (65.3 kg)  07/03/23 151 lb (68.5 kg)       Assessment & Plan:   Problem List Items Addressed This Visit       Unprioritized   Dysuria - Primary    Reported  discomfort during urination last week, which has since resolved. Urinalysis showed a few white blood cells, indicating possible infection. -Send urine for culture. -If culture grows bacteria, start antibiotic treatment.       Relevant Orders   POCT Urinalysis Dipstick (Automated) (Completed)   Urine Culture    I am having Shelley Ramirez maintain her EPINEPHrine , Clindamycin -Benzoyl Per (Refr), tretinoin , traZODone , ondansetron , tirzepatide , Vitamin D3, and SUMAtriptan .  Meds ordered this encounter  Medications   SUMAtriptan  (IMITREX ) 50 MG tablet    Sig: Take 1 tablet (50 mg total) by mouth every 2 (two) hours as needed for migraine. May repeat in 2 hours if headache persists or recurs.    Dispense:  30 tablet    Refill:  0

## 2023-09-05 LAB — URINE CULTURE
MICRO NUMBER:: 16063125
Result:: NO GROWTH
SPECIMEN QUALITY:: ADEQUATE

## 2023-09-06 ENCOUNTER — Other Ambulatory Visit: Payer: Self-pay | Admitting: Family

## 2023-09-06 ENCOUNTER — Encounter: Payer: Self-pay | Admitting: Family

## 2023-09-11 ENCOUNTER — Other Ambulatory Visit: Payer: Self-pay

## 2023-09-11 ENCOUNTER — Telehealth: Payer: Self-pay

## 2023-09-11 MED ORDER — SUMATRIPTAN SUCCINATE 50 MG PO TABS: 50.0000 mg | ORAL_TABLET | ORAL | 0 refills | Status: DC | PRN

## 2023-09-11 NOTE — Telephone Encounter (Signed)
Copied from CRM [id number]. Topic: Clinical - Prescription Issue >> Sep 11, 2023 11:01 AM Martinique E wrote: Reason for CRM: Patient got prescribed SUMAtriptan (IMITREX) 50 MG tablet during her visit on 2/10 and pharmacy still does not have this medication as patient stated it was denied. Patient would like a phone call at [phone] for clarification regarding the denial. Patient requests it get sent to: [id number] - [place], [place] - [address]

## 2023-09-11 NOTE — Telephone Encounter (Signed)
Per [facility] they did not have the patient's insurance information. Rx was sent to [facility] as requested.

## 2023-09-20 ENCOUNTER — Other Ambulatory Visit (HOSPITAL_BASED_OUTPATIENT_CLINIC_OR_DEPARTMENT_OTHER): Payer: Self-pay

## 2023-09-20 ENCOUNTER — Encounter: Payer: Self-pay | Admitting: Family

## 2023-09-20 ENCOUNTER — Encounter: Payer: Self-pay | Admitting: Bariatrics

## 2023-09-20 ENCOUNTER — Ambulatory Visit: Payer: 59 | Admitting: Bariatrics

## 2023-09-20 VITALS — BP 102/71 | HR 74 | Temp 98.0°F | Ht 61.0 in | Wt 145.0 lb

## 2023-09-20 DIAGNOSIS — E559 Vitamin D deficiency, unspecified: Secondary | ICD-10-CM | POA: Diagnosis not present

## 2023-09-20 DIAGNOSIS — G4700 Insomnia, unspecified: Secondary | ICD-10-CM

## 2023-09-20 DIAGNOSIS — Z6827 Body mass index (BMI) 27.0-27.9, adult: Secondary | ICD-10-CM

## 2023-09-20 DIAGNOSIS — E669 Obesity, unspecified: Secondary | ICD-10-CM | POA: Diagnosis not present

## 2023-09-20 DIAGNOSIS — R110 Nausea: Secondary | ICD-10-CM

## 2023-09-20 DIAGNOSIS — R632 Polyphagia: Secondary | ICD-10-CM

## 2023-09-20 DIAGNOSIS — E88819 Insulin resistance, unspecified: Secondary | ICD-10-CM | POA: Diagnosis not present

## 2023-09-20 MED ORDER — ONDANSETRON 4 MG PO TBDP
4.0000 mg | ORAL_TABLET | Freq: Three times a day (TID) | ORAL | 0 refills | Status: DC | PRN
  Filled 2023-09-20: qty 18, 6d supply, fill #0

## 2023-09-20 MED ORDER — VITAMIN D3 1.25 MG (50000 UT) PO CAPS
1.0000 | ORAL_CAPSULE | ORAL | 0 refills | Status: AC
  Filled 2023-09-20: qty 5, 35d supply, fill #0

## 2023-09-20 MED ORDER — TIRZEPATIDE 10 MG/0.5ML ~~LOC~~ SOAJ
10.0000 mg | SUBCUTANEOUS | 0 refills | Status: DC
  Filled 2023-09-20: qty 2, 28d supply, fill #0

## 2023-09-20 NOTE — Progress Notes (Signed)
 WEIGHT SUMMARY AND BIOMETRICS  Weight Lost Since Last Visit: 0lb  Weight Gained Since Last Visit: 1lb   Vitals Temp: 98 F (36.7 C) BP: 102/71 Pulse Rate: 74 SpO2: 100 %   Anthropometric Measurements Height: 5\' 1"  (1.549 m) Weight: 145 lb (65.8 kg) BMI (Calculated): 27.41 Weight at Last Visit: 144lb Weight Lost Since Last Visit: 0lb Weight Gained Since Last Visit: 1lb Starting Weight: 205lb Total Weight Loss (lbs): 60 lb (27.2 kg)   Body Composition  Body Fat %: 31.2 % Fat Mass (lbs): 49 lbs Muscle Mass (lbs): 91.4 lbs Total Body Water (lbs): 69.6 lbs Visceral Fat Rating : 6   Other Clinical Data Fasting: No Labs: No Today's Visit #: 80 Starting Date: 03/25/21    OBESITY Shelley Ramirez is here to discuss her progress with her obesity treatment plan along with follow-up of her obesity related diagnoses.    Nutrition Plan: the Category 1 plan - 85% adherence.  Current exercise: none  Interim History:  She is up 1 lb since her last visit.  Eating all of the food on the plan., Protein intake is as prescribed, Is not skipping meals, Water intake is adequate., and Denies polyphagia   Pharmacotherapy: Shelley Ramirez is on Mounjaro 10 mg SQ weekly Adverse side effects: None, except occasional nausea.  Hunger is well controlled.  Cravings are well controlled.  Assessment/Plan:   Insulin Resistance Shelley Ramirez has had elevated fasting insulin readings. Goal is HgbA1c < 5.7, fasting insulin at l0 or less, and preferably at 5.  She  denies polyphagia when she is taking the [facility]. Medication(s): Mounjaro Lab Results  Component Value Date   HGBA1C 5.0 08/23/2023   Lab Results  Component Value Date   INSULIN 7.5 08/23/2023   INSULIN 6.5 12/14/2022   INSULIN 17.8 04/19/2022   INSULIN 10.2 10/07/2021   INSULIN 8.5 03/25/2021    Plan Medication(s): Mounjaro 10 mg  SQ weekly Will work on the agreed upon plan. Will minimize refined carbohydrates ( sweets and starches), and focus more on complex carbohydrates.  Increase the micronutrients found in leafy greens, which include magnesium, polyphenols, and vitamin C which have been postulated to help with insulin sensitivity. Minimize "fast food" and cook more meals at home.  Increase fiber to 25 to 30 grams daily.  She will continue to read labels. She will continue to cook at home.  Polyphagia Shelley Ramirez endorses excessive hunger.  Medication(s): Mounjaro Effects of medication:  well controlled. Cravings are moderately controlled.   Plan: Medication(s): Mounjaro 10 mg SQ weekly Will increase water, protein and fiber to help assuage hunger.  Will minimize foods that have a high glucose index/load to minimize reactive hypoglycemia.   Vitamin D Deficiency Vitamin D is not at goal of 50.  Most recent vitamin D level was 34.2. She is on  prescription ergocalciferol 50,000 IU weekly. Lab Results  Component Value  Date   VD25OH 34.2 08/23/2023   VD25OH 27.6 (L) 12/14/2022   VD25OH 22.0 (L) 04/19/2022    Plan: Refill prescription vitamin D 50,000 IU weekly.   Nausea without vomiting:   She has occasional nausea but no vomiting when she takes her Mounjaro.  This has improved over time.  Plan: Rx: Zofran 4 mg 1 every 8 hours as needed #30 with no refills.  Insomnia: She has a history of some insomnia.  He has tried prescription medication but they make her feel groggy and she stopped those medications.  She would like to have a more natural approach.   Plan: We discussed some possible herbs that she could try that do have some research behind them such as valerian root lemon balm, passion flower and L-theanine.  I told her that there is research behind these medications, especially valerian root and told her that it typically takes a long time for them to be effective up to 6 to 12 weeks. We also  discussed herbal and vitamin brands that have been vetted, a list was given.   Generalized Obesity: Current BMI BMI (Calculated): 27.41   Pharmacotherapy Plan Continue and refill  Mounjaro 10 mg SQ weekly  Shelley Ramirez is currently in the action stage of change. As such, her goal is to continue with weight loss efforts.  She has agreed to the Category 1 plan.  Exercise goals: For substantial health benefits, adults should do at least 150 minutes (2 hours and 30 minutes) a week of moderate-intensity, or 75 minutes (1 hour and 15 minutes) a week of vigorous-intensity aerobic physical activity, or an equivalent combination of moderate- and vigorous-intensity aerobic activity. Aerobic activity should be performed in episodes of at least 10 minutes, and preferably, it should be spread throughout the week.  Behavioral modification strategies: increasing lean protein intake, increase water intake, better snacking choices, planning for success, increasing vegetables, and keep healthy foods in the home.  Shelley Ramirez has agreed to follow-up with our clinic in 4 weeks.    Objective:   VITALS: Per patient if applicable, see vitals. GENERAL: Alert and in no acute distress. CARDIOPULMONARY: No increased WOB. Speaking in clear sentences.  PSYCH: Pleasant and cooperative. Speech normal rate and rhythm. Affect is appropriate. Insight and judgement are appropriate. Attention is focused, linear, and appropriate.  NEURO: Oriented as arrived to appointment on time with no prompting.   Attestation Statements:   This was prepared with the assistance of Engineer, civil (consulting).  Occasional wrong-word or sound-a-like substitutions may have occurred due to the inherent limitations of voice recognition   Corinna Capra, DO

## 2023-10-02 ENCOUNTER — Other Ambulatory Visit (HOSPITAL_BASED_OUTPATIENT_CLINIC_OR_DEPARTMENT_OTHER): Payer: Self-pay

## 2023-10-04 ENCOUNTER — Other Ambulatory Visit (HOSPITAL_BASED_OUTPATIENT_CLINIC_OR_DEPARTMENT_OTHER): Payer: Self-pay

## 2023-10-04 ENCOUNTER — Telehealth: Payer: Self-pay | Admitting: Bariatrics

## 2023-10-04 ENCOUNTER — Encounter: Payer: Self-pay | Admitting: Bariatrics

## 2023-10-04 ENCOUNTER — Encounter: Payer: Self-pay | Admitting: Family

## 2023-10-04 ENCOUNTER — Other Ambulatory Visit: Payer: Self-pay | Admitting: Bariatrics

## 2023-10-04 MED ORDER — TIRZEPATIDE 5 MG/0.5ML ~~LOC~~ SOAJ
5.0000 mg | SUBCUTANEOUS | 0 refills | Status: DC
  Filled 2023-10-04: qty 2, 28d supply, fill #0

## 2023-10-04 NOTE — Telephone Encounter (Signed)
 Pt is calling requesting an RX for Mounjaro 5 ml. Pt stated that the 10 ml is too much for her and she has lost so much weight. Please send RX to [facility] [address]

## 2023-10-16 ENCOUNTER — Encounter: Payer: Self-pay | Admitting: Family

## 2023-10-16 ENCOUNTER — Other Ambulatory Visit (HOSPITAL_BASED_OUTPATIENT_CLINIC_OR_DEPARTMENT_OTHER): Payer: Self-pay

## 2023-10-16 ENCOUNTER — Ambulatory Visit: Payer: 59 | Admitting: Bariatrics

## 2023-10-16 ENCOUNTER — Encounter: Payer: Self-pay | Admitting: Bariatrics

## 2023-10-16 VITALS — BP 108/70 | HR 80 | Temp 97.8°F | Ht 61.0 in | Wt 145.0 lb

## 2023-10-16 DIAGNOSIS — R112 Nausea with vomiting, unspecified: Secondary | ICD-10-CM | POA: Diagnosis not present

## 2023-10-16 DIAGNOSIS — Z6827 Body mass index (BMI) 27.0-27.9, adult: Secondary | ICD-10-CM

## 2023-10-16 DIAGNOSIS — E669 Obesity, unspecified: Secondary | ICD-10-CM

## 2023-10-16 DIAGNOSIS — R632 Polyphagia: Secondary | ICD-10-CM

## 2023-10-16 DIAGNOSIS — R110 Nausea: Secondary | ICD-10-CM

## 2023-10-16 MED ORDER — ONDANSETRON 4 MG PO TBDP
4.0000 mg | ORAL_TABLET | Freq: Three times a day (TID) | ORAL | 0 refills | Status: DC | PRN
  Filled 2023-10-16 – 2023-10-27 (×2): qty 30, 10d supply, fill #0

## 2023-10-16 MED ORDER — TIRZEPATIDE 7.5 MG/0.5ML ~~LOC~~ SOAJ
7.5000 mg | SUBCUTANEOUS | 0 refills | Status: DC
  Filled 2023-10-16: qty 2, 28d supply, fill #0

## 2023-10-16 NOTE — Progress Notes (Signed)
 WEIGHT SUMMARY AND BIOMETRICS  Weight Lost Since Last Visit: 0  Weight Gained Since Last Visit: 0   Vitals Temp: 97.8 F (36.6 C) BP: 108/70 Pulse Rate: 80 SpO2: 100 %   Anthropometric Measurements Height: 5\' 1"  (1.549 m) Weight: 145 lb (65.8 kg) BMI (Calculated): 27.41 Weight at Last Visit: 145lb Weight Lost Since Last Visit: 0 Weight Gained Since Last Visit: 0 Starting Weight: 205lb Total Weight Loss (lbs): 60 lb (27.2 kg)   Body Composition  Body Fat %: 31.2 % Fat Mass (lbs): 45.4 lbs Muscle Mass (lbs): 95.2 lbs Total Body Water (lbs): 67.2 lbs Visceral Fat Rating : 6   Other Clinical Data Fasting: no Labs: no Today's Visit #: 30 Starting Date: 03/25/21    OBESITY Shelley Ramirez is here to discuss her progress with her obesity treatment plan along with follow-up of her obesity related diagnoses.    Nutrition Plan: the Category 1 plan - 85% adherence.  Current exercise: none  Interim History:  Her weight remains the same.  Eating all of the food on the plan., Protein intake is as prescribed, Is not skipping meals, and Meeting protein goals.   Pharmacotherapy: Shelley Ramirez is on Shelley Ramirez 5.0 mg SQ weekly Adverse side effects: Nausea , mild , more pronounced with "fatty" foods. Hunger is moderately controlled.  Cravings are well controlled.  Assessment/Plan:   Polyphagia Shelley Ramirez endorses excessive hunger.  Medication(s): Shelley Ramirez  Effects of medication on appetite:  moderately controlled. Cravings are well controlled.   Plan: Medication(s): Shelley Ramirez 7.5 mg SQ weekly. She has moved down on the dose as her appetite has been better controlled.  Will increase water, protein and fiber to help assuage hunger.  Will minimize foods that have a high glucose index/load to minimize reactive hypoglycemia.  She will continue to exercise.   Nausea/vomiting:    She has minimal nausea with " fatty/greasy foods.   Plan: RX:: Shelley Ramirez 4 mg 1 every 8 hours PRN # 30 with no refills.  Will avoid fatty foods.    Generalized Obesity: Current BMI BMI (Calculated): 27.41   Pharmacotherapy Plan Change to  Shelley Ramirez 7.5 mg SQ weekly  Shelley Ramirez is currently in the action stage of change. As such, her goal is to continue with weight loss efforts.  She has agreed to the Category 1 plan.  Exercise goals: All adults should avoid inactivity. Some physical activity is better than none, and adults who participate in any amount of physical activity gain some health benefits. She takes the stairs and some NEAT exercises.   Behavioral modification strategies: increasing lean protein intake, decreasing simple carbohydrates , no meal skipping, decrease eating out, meal planning , increase water intake, better snacking choices, planning for success, get rid of junk food in the home, avoiding temptations, keep healthy foods in the home, weigh protein portions, and mindful eating.  Shelley Ramirez has agreed to follow-up  with our clinic in 4 weeks.       Objective:   VITALS: Per patient if applicable, see vitals. GENERAL: Alert and in no acute distress. CARDIOPULMONARY: No increased WOB. Speaking in clear sentences.  PSYCH: Pleasant and cooperative. Speech normal rate and rhythm. Affect is appropriate. Insight and judgement are appropriate. Attention is focused, linear, and appropriate.  NEURO: Oriented as arrived to appointment on time with no prompting.   Attestation Statements:    This was prepared with the assistance of Engineer, civil (consulting).  Occasional wrong-word or sound-a-like substitutions may have occurred due to the inherent limitations of voice recognition   Corinna Capra, DO

## 2023-10-27 ENCOUNTER — Encounter: Payer: Self-pay | Admitting: Family

## 2023-10-27 ENCOUNTER — Other Ambulatory Visit (HOSPITAL_BASED_OUTPATIENT_CLINIC_OR_DEPARTMENT_OTHER): Payer: Self-pay

## 2023-11-20 ENCOUNTER — Other Ambulatory Visit (HOSPITAL_BASED_OUTPATIENT_CLINIC_OR_DEPARTMENT_OTHER): Payer: Self-pay

## 2023-11-20 ENCOUNTER — Encounter: Payer: Self-pay | Admitting: Family

## 2023-11-20 ENCOUNTER — Ambulatory Visit: Admitting: Bariatrics

## 2023-11-20 ENCOUNTER — Encounter: Payer: Self-pay | Admitting: Bariatrics

## 2023-11-20 VITALS — BP 98/67 | HR 82 | Temp 97.6°F | Ht 61.0 in | Wt 145.0 lb

## 2023-11-20 DIAGNOSIS — E88819 Insulin resistance, unspecified: Secondary | ICD-10-CM | POA: Diagnosis not present

## 2023-11-20 DIAGNOSIS — R110 Nausea: Secondary | ICD-10-CM

## 2023-11-20 DIAGNOSIS — Z6827 Body mass index (BMI) 27.0-27.9, adult: Secondary | ICD-10-CM

## 2023-11-20 DIAGNOSIS — E669 Obesity, unspecified: Secondary | ICD-10-CM

## 2023-11-20 DIAGNOSIS — R632 Polyphagia: Secondary | ICD-10-CM

## 2023-11-20 MED ORDER — TIRZEPATIDE 7.5 MG/0.5ML ~~LOC~~ SOAJ
7.5000 mg | SUBCUTANEOUS | 0 refills | Status: DC
  Filled 2023-11-20 – 2023-12-06 (×2): qty 2, 28d supply, fill #0

## 2023-11-20 MED ORDER — ONDANSETRON 4 MG PO TBDP
4.0000 mg | ORAL_TABLET | Freq: Three times a day (TID) | ORAL | 0 refills | Status: DC | PRN
  Filled 2023-11-20: qty 30, 10d supply, fill #0
  Filled 2023-12-06: qty 18, 6d supply, fill #0

## 2023-11-20 NOTE — Progress Notes (Signed)
 WEIGHT SUMMARY AND BIOMETRICS  Weight Lost Since Last Visit: 0lb  Weight Gained Since Last Visit: 0lb   Vitals Temp: 97.6 F (36.4 C) BP: 98/67 Pulse Rate: 82 SpO2: 100 %   Anthropometric Measurements Height: 5\' 1"  (1.549 m) Weight: 145 lb (65.8 kg) BMI (Calculated): 27.41 Weight at Last Visit: 145lb Weight Lost Since Last Visit: 0lb Weight Gained Since Last Visit: 0lb Starting Weight: 205lb Total Weight Loss (lbs): 60 lb (27.2 kg)   Body Composition  Body Fat %: 32.2 % Fat Mass (lbs): 46.8 lbs Muscle Mass (lbs): 93.4 lbs Total Body Water (lbs): 66.4 lbs Visceral Fat Rating : 6   Other Clinical Data Fasting: No Labs: no Today's Visit #: 31 Starting Date: 03/25/21    OBESITY Shelley Ramirez is here to discuss her progress with her obesity treatment plan along with follow-up of her obesity related diagnoses.    Nutrition Plan: the Category 1 plan - 85% adherence.  Current exercise: none  Interim History:  Her weight remains the same.  Eating all of the food on the plan., Protein intake is as prescribed, Is not skipping meals, and Denies polyphagia   Pharmacotherapy: Shelley Ramirez is on Mounjaro  7.5 mg SQ weekly Adverse side effects: None Hunger is well controlled.  Cravings are moderately controlled.  Assessment/Plan:   Polyphagia Shelley Ramirez endorses excessive hunger.  Medication(s): Mounjaro  Effects of medication:  moderately controlled. Cravings are moderately controlled.   Plan: Medication(s): Mounjaro  7.5 mg SQ weekly Will increase water, protein and fiber to help assuage hunger.  Will minimize foods that have a high glucose index/load to minimize reactive hypoglycemia.  She will continue her exercise.   Insulin  Resistance Shelley Ramirez has had elevated fasting insulin  readings. Goal is HgbA1c < 5.7, fasting insulin  at l0 or less, and preferably at 5.  She  denies polyphagia with the medication.  Medication(s): Mounjaro  Lab Results  Component Value Date   HGBA1C 5.0 08/23/2023   Lab Results  Component Value Date   INSULIN  7.5 08/23/2023   INSULIN  6.5 12/14/2022   INSULIN  17.8 04/19/2022   INSULIN  10.2 10/07/2021   INSULIN  8.5 03/25/2021    Plan Medication(s): Mounjaro  7.5 mg SQ weekly Will work on the agreed upon plan. Will minimize refined carbohydrates ( sweets and starches), and focus more on complex carbohydrates.  Increase the micronutrients found in leafy greens, which include magnesium, polyphenols, and vitamin C which have been postulated to help with insulin  sensitivity. Minimize "fast food" and cook more meals at home.  Increase fiber to 25 to 30 grams daily.  She will avoid dairy and fatty foods.  Will work on not eating too much or eating too little to minimize nausea with Mounjaro . She will keep her water intake high.  Nausea without vomiting:   She occasionally has some nausea with the Mounjaro .  She is not having any vomiting and usually only has  nausea with fatty foods or dairy which she is avoiding.   Plan: Zofran  4 mg 1 every 8 hours as needed for nausea #30 with no refills     Generalized Obesity: Current BMI BMI (Calculated): 27.41   Pharmacotherapy Plan Continue and refill  Mounjaro  7.5 mg SQ weekly  Shelley Ramirez is currently in the action stage of change. As such, her goal is to continue with weight loss efforts.  She has agreed to the Category 1 plan.  Exercise goals: For additional and more extensive health benefits, adults should increase their aerobic physical activity to 300 minutes (5 hours) a week of moderate-intensity, or 150 minutes a week of vigorous-intensity aerobic physical activity, or an equivalent combination of moderate- and vigorous-intensity activity. Additional health benefits are gained by engaging in physical activity beyond this amount.   Behavioral modification strategies: increasing  lean protein intake, decreasing simple carbohydrates , no meal skipping, decrease eating out, meal planning , and increase water intake.  Shelley Ramirez has agreed to follow-up with our clinic in 4 weeks.    Objective:   VITALS: Per patient if applicable, see vitals. GENERAL: Alert and in no acute distress. CARDIOPULMONARY: No increased WOB. Speaking in clear sentences.  PSYCH: Pleasant and cooperative. Speech normal rate and rhythm. Affect is appropriate. Insight and judgement are appropriate. Attention is focused, linear, and appropriate.  NEURO: Oriented as arrived to appointment on time with no prompting.   Attestation Statements:   This was prepared with the assistance of Engineer, civil (consulting).  Occasional wrong-word or sound-a-like substitutions may have occurred due to the inherent limitations of voice recognition   Kirk Peper, DO

## 2023-11-30 ENCOUNTER — Other Ambulatory Visit (HOSPITAL_BASED_OUTPATIENT_CLINIC_OR_DEPARTMENT_OTHER): Payer: Self-pay

## 2023-12-06 ENCOUNTER — Other Ambulatory Visit (HOSPITAL_BASED_OUTPATIENT_CLINIC_OR_DEPARTMENT_OTHER): Payer: Self-pay

## 2023-12-21 ENCOUNTER — Other Ambulatory Visit (HOSPITAL_BASED_OUTPATIENT_CLINIC_OR_DEPARTMENT_OTHER): Payer: Self-pay

## 2023-12-21 ENCOUNTER — Encounter: Payer: Self-pay | Admitting: Bariatrics

## 2023-12-21 ENCOUNTER — Ambulatory Visit: Admitting: Bariatrics

## 2023-12-21 DIAGNOSIS — R632 Polyphagia: Secondary | ICD-10-CM

## 2023-12-21 DIAGNOSIS — R112 Nausea with vomiting, unspecified: Secondary | ICD-10-CM

## 2023-12-21 DIAGNOSIS — Z6827 Body mass index (BMI) 27.0-27.9, adult: Secondary | ICD-10-CM | POA: Diagnosis not present

## 2023-12-21 DIAGNOSIS — E88819 Insulin resistance, unspecified: Secondary | ICD-10-CM | POA: Diagnosis not present

## 2023-12-21 DIAGNOSIS — E669 Obesity, unspecified: Secondary | ICD-10-CM

## 2023-12-21 DIAGNOSIS — R110 Nausea: Secondary | ICD-10-CM

## 2023-12-21 MED ORDER — ONDANSETRON 4 MG PO TBDP
4.0000 mg | ORAL_TABLET | Freq: Three times a day (TID) | ORAL | 0 refills | Status: DC | PRN
  Filled 2023-12-21 – 2023-12-27 (×2): qty 30, 10d supply, fill #0
  Filled 2024-01-15: qty 18, 6d supply, fill #0
  Filled 2024-03-21: qty 12, 4d supply, fill #1

## 2023-12-21 MED ORDER — TIRZEPATIDE 7.5 MG/0.5ML ~~LOC~~ SOAJ
7.5000 mg | SUBCUTANEOUS | 0 refills | Status: DC
  Filled 2023-12-21 – 2024-01-01 (×2): qty 2, 28d supply, fill #0

## 2023-12-21 NOTE — Progress Notes (Signed)
 WEIGHT SUMMARY AND BIOMETRICS  Weight Lost Since Last Visit: 1lb  Weight Gained Since Last Visit: 0   Vitals Temp: 97.8 F (36.6 C) BP: 111/77 Pulse Rate: 83 SpO2: 100 %   Anthropometric Measurements Height: 5\' 1"  (1.549 m) Weight: 144 lb (65.3 kg) BMI (Calculated): 27.22 Weight at Last Visit: 145lb Weight Lost Since Last Visit: 1lb Weight Gained Since Last Visit: 0 Starting Weight: 205lb Total Weight Loss (lbs): 61 lb (27.7 kg)   Body Composition  Body Fat %: 32 % Fat Mass (lbs): 46.4 lbs Muscle Mass (lbs): 93.4 lbs Total Body Water (lbs): 66.2 lbs Visceral Fat Rating : 6   Other Clinical Data Fasting: no Labs: no Today's Visit #: 32 Starting Date: 03/25/21    OBESITY Jacquelyn is here to discuss her progress with her obesity treatment plan along with follow-up of her obesity related diagnoses.    Nutrition Plan: the Category 1 plan - 90% adherence.  Current exercise: none  Interim History:  She is down 1 lb since her last visit. She has done very well overall.  She states that she had been sick with an upper respiratory virus and had not taken her Mounjaro  for about a week and a half but has now started back on the Mounjaro  without any issues and is eating more protein. Not eating all of the food on the plan., Protein intake is less than prescribed., Water intake is adequate., and Denies polyphagia   Pharmacotherapy: Roseana is on Mounjaro  7.5 mg SQ weekly Adverse side effects: None Hunger is moderately controlled.  Cravings are moderately controlled.  Assessment/Plan:   Polyphagia Aliha endorses excessive hunger.  Medication(s): Mounjaro  Effects of medication: (appetite)   moderately controlled. Cravings are moderately controlled.   Plan: Medication(s): Mounjaro  7.5 mg Will increase water, protein and fiber to help assuage hunger.  Will  minimize foods that have a high glucose index/load to minimize reactive hypoglycemia.  Will get her protein levels back to baseline. Will continue to cook more meals at home. Will make good decisions when she goes to a restaurant.  Insulin  Resistance Janell has had elevated fasting insulin  readings. Goal is HgbA1c < 5.7, fasting insulin  at l0 or less, and preferably at 5.  She denies polyphagia. Medication(s): Mounjaro  Lab Results  Component Value Date   HGBA1C 5.0 08/23/2023   Lab Results  Component Value Date   INSULIN  7.5 08/23/2023   INSULIN  6.5 12/14/2022   INSULIN  17.8 04/19/2022   INSULIN  10.2 10/07/2021   INSULIN  8.5 03/25/2021    Plan Medication(s): Mounjaro  7.5 mg  Will work on the agreed upon plan. Will minimize refined carbohydrates ( sweets and starches), and focus more on complex carbohydrates.  Increase the micronutrients found in leafy greens, which include magnesium, polyphenols, and vitamin C which have been postulated to help with insulin  sensitivity. Minimize "fast food" and cook more meals  at home.  Increase fiber to 25 to 30 grams daily.  Information sheet on " Insulin  Resistance and Prediabetes".    Nausea and vomiting:   She has some mild nausea but no vomiting with her GLP-1 occasionally and would like to continue having the Zofran  on hand as needed.   Plan: Rx: Zofran  4 mg every 8 hours as needed, #30 with no refills.    Generalized Obesity: Current BMI BMI (Calculated): 27.22   Pharmacotherapy Plan Continue and refill  Mounjaro  7.5 mg SQ weekly  Rogenia is currently in the action stage of change. As such, her goal is to continue with weight loss efforts.  She has agreed to the Category 1 plan.  Exercise goals: For substantial health benefits, adults should do at least 150 minutes (2 hours and 30 minutes) a week of moderate-intensity, or 75 minutes (1 hour and 15 minutes) a week of vigorous-intensity aerobic physical activity, or an equivalent  combination of moderate- and vigorous-intensity aerobic activity. Aerobic activity should be performed in episodes of at least 10 minutes, and preferably, it should be spread throughout the week.  Behavioral modification strategies: decrease eating out, meal planning , planning for success, increasing vegetables, avoiding temptations, and mindful eating.  Lamis has agreed to follow-up with our clinic in 4 weeks.     Objective:   VITALS: Per patient if applicable, see vitals. GENERAL: Alert and in no acute distress. CARDIOPULMONARY: No increased WOB. Speaking in clear sentences.  PSYCH: Pleasant and cooperative. Speech normal rate and rhythm. Affect is appropriate. Insight and judgement are appropriate. Attention is focused, linear, and appropriate.  NEURO: Oriented as arrived to appointment on time with no prompting.   Attestation Statements:   This was prepared with the assistance of Engineer, civil (consulting).  Occasional wrong-word or sound-a-like substitutions may have occurred due to the inherent limitations of voice recognition   Kirk Peper, DO

## 2023-12-27 ENCOUNTER — Encounter: Payer: Self-pay | Admitting: Family

## 2023-12-27 ENCOUNTER — Other Ambulatory Visit (HOSPITAL_BASED_OUTPATIENT_CLINIC_OR_DEPARTMENT_OTHER): Payer: Self-pay

## 2024-01-01 ENCOUNTER — Telehealth: Payer: Self-pay | Admitting: Bariatrics

## 2024-01-01 ENCOUNTER — Other Ambulatory Visit (HOSPITAL_BASED_OUTPATIENT_CLINIC_OR_DEPARTMENT_OTHER): Payer: Self-pay

## 2024-01-01 NOTE — Telephone Encounter (Signed)
 Pt is calling because they mounjaro  7.5 ml is to strong and pt would like mounjaro  5 ml. Please advise.

## 2024-01-01 NOTE — Telephone Encounter (Signed)
 Left message for patient to return call.

## 2024-01-03 ENCOUNTER — Other Ambulatory Visit (HOSPITAL_BASED_OUTPATIENT_CLINIC_OR_DEPARTMENT_OTHER): Payer: Self-pay

## 2024-01-03 ENCOUNTER — Other Ambulatory Visit: Payer: Self-pay | Admitting: Bariatrics

## 2024-01-03 MED ORDER — TIRZEPATIDE 5 MG/0.5ML ~~LOC~~ SOAJ
5.0000 mg | SUBCUTANEOUS | 0 refills | Status: DC
  Filled 2024-01-03: qty 2, 28d supply, fill #0

## 2024-01-03 NOTE — Telephone Encounter (Signed)
 Dr. Bevin Bucks can you please advise? Patient stated she was having some fatigue with the 7.5mg  and wants to know if she can go back to 5mg  dosage.

## 2024-01-11 ENCOUNTER — Other Ambulatory Visit (HOSPITAL_BASED_OUTPATIENT_CLINIC_OR_DEPARTMENT_OTHER): Payer: Self-pay

## 2024-01-15 ENCOUNTER — Encounter: Payer: Self-pay | Admitting: Bariatrics

## 2024-01-15 ENCOUNTER — Other Ambulatory Visit (HOSPITAL_BASED_OUTPATIENT_CLINIC_OR_DEPARTMENT_OTHER): Payer: Self-pay

## 2024-01-15 ENCOUNTER — Ambulatory Visit: Admitting: Bariatrics

## 2024-01-15 VITALS — BP 98/71 | HR 83 | Temp 97.8°F | Ht 61.0 in | Wt 147.0 lb

## 2024-01-15 DIAGNOSIS — Z6827 Body mass index (BMI) 27.0-27.9, adult: Secondary | ICD-10-CM | POA: Diagnosis not present

## 2024-01-15 DIAGNOSIS — R632 Polyphagia: Secondary | ICD-10-CM | POA: Diagnosis not present

## 2024-01-15 DIAGNOSIS — E669 Obesity, unspecified: Secondary | ICD-10-CM | POA: Diagnosis not present

## 2024-01-15 MED ORDER — TIRZEPATIDE 7.5 MG/0.5ML ~~LOC~~ SOAJ
7.5000 mg | SUBCUTANEOUS | 0 refills | Status: DC
  Filled 2024-01-15: qty 2, 28d supply, fill #0

## 2024-01-15 NOTE — Progress Notes (Signed)
 WEIGHT SUMMARY AND BIOMETRICS  Weight Lost Since Last Visit: 0  Weight Gained Since Last Visit: 3lb   Vitals Temp: 97.8 F (36.6 C) BP: 98/71 Pulse Rate: 83 SpO2: 100 %   Anthropometric Measurements Height: 5' 1 (1.549 m) Weight: 147 lb (66.7 kg) BMI (Calculated): 27.79 Weight at Last Visit: 144lb Weight Lost Since Last Visit: 0 Weight Gained Since Last Visit: 3lb Starting Weight: 205lb Total Weight Loss (lbs): 58 lb (26.3 kg)   Body Composition  Body Fat %: 33 % Fat Mass (lbs): 48.6 lbs Muscle Mass (lbs): 93.4 lbs Total Body Water (lbs): 70.2 lbs Visceral Fat Rating : 6   Other Clinical Data Fasting: no Labs: no Today's Visit #: 21 Starting Date: 03/25/21    OBESITY Shelley Ramirez is here to discuss her progress with her obesity treatment plan along with follow-up of her obesity related diagnoses.    Nutrition Plan: the Category 1 plan - 80% adherence.  Current exercise: none  Interim History:  She is up 3 lbs since her last visit. She went down on her Mounjaro  from 7.5 mg to 5 mg. She was concerned that her appetite was too low, but resolved and wants to stay with the Mounjaro  7.5 mg dose. Mounjaro  5.0 mg was prescribed last week but patient did not begin medication.  Eating all of the food on the plan., Protein intake is as prescribed, and Water intake is adequate.   Pharmacotherapy: Oda is on Mounjaro  7.5 mg SQ weekly Adverse side effects: Nausea, mild, occasional. Hunger is moderately controlled.  Cravings are moderately controlled.  Assessment/Plan:   Polyphagia Chrisy endorses excessive hunger.  Medication(s): Mounjaro  7.5 mg. Effects of medication:  moderately controlled. Cravings are moderately controlled.   Plan: Medication(s): Mounjaro  7.5 mg SQ weekly Will increase water, protein and fiber to help assuage hunger.  Will minimize  foods that have a high glucose index/load to minimize reactive hypoglycemia.  She will not skip meals. Will continue to cook more at home and do meal planning.    Generalized Obesity: Current BMI BMI (Calculated): 27.79   Pharmacotherapy Plan Continue and refill  Mounjaro  7.5 mg SQ weekly  Darrelyn is currently in the action stage of change. As such, her goal is to continue with weight loss efforts.  She has agreed to the Category 1 plan.  Exercise goals: All adults should avoid inactivity. Some physical activity is better than none, and adults who participate in any amount of physical activity gain some health benefits.  Behavioral modification strategies: increasing lean protein intake, no meal skipping, meal planning , better snacking choices, planning for success, increasing vegetables, and keep healthy foods in the home.  Sharelle has agreed to follow-up with our clinic in 4 weeks.      Objective:   VITALS: Per patient if applicable, see vitals. GENERAL: Alert and in no acute distress. CARDIOPULMONARY: No  increased WOB. Speaking in clear sentences.  PSYCH: Pleasant and cooperative. Speech normal rate and rhythm. Affect is appropriate. Insight and judgement are appropriate. Attention is focused, linear, and appropriate.  NEURO: Oriented as arrived to appointment on time with no prompting.   Attestation Statements:   This was prepared with the assistance of Engineer, civil (consulting).  Occasional wrong-word or sound-a-like substitutions may have occurred due to the inherent limitations of voice recognition   Clayborne Daring, DO

## 2024-01-15 NOTE — Progress Notes (Deleted)
                                                                                                              WEIGHT SUMMARY AND BIOMETRICS  No data recorded No data recorded  No data recorded No data recorded No data recorded No data recorded  OBESITY Shelley Ramirez is here to discuss her progress with her obesity treatment plan along with follow-up of her obesity related diagnoses.    Nutrition Plan: {dwwsldiets:29085} - ***% adherence.  Current exercise: {exercise types:16438}  Interim History:  *** {aabnutritionassessment:29213}   Pharmacotherapy: Shelley Ramirez is on {dwwpharmacotherapy:29109} Adverse side effects: {dwwse:29122} Hunger is {EWCONTROLASSESSMENT:24261}.  Cravings are {EWCONTROLASSESSMENT:24261}.  Assessment/Plan:   There are no diagnoses linked to this encounter.    {dwwmorbid:29108::Morbid Obesity}: Current BMI No data recorded  Pharmacotherapy Plan {dwwmed:29123}  {dwwpharmacotherapy:29109}  Shelley Ramirez {CHL AMB IS/IS NOT:210130109} currently in the action stage of change. As such, her goal is to {MWMwtloss#1:210800005}.  She has agreed to {dwwsldiets:29085}.  Exercise goals: {MWM EXERCISE RECS:23473}  Behavioral modification strategies: {dwwslwtlossstrategies:29088}.  Shelley Ramirez has agreed to follow-up with our clinic in {NUMBER 1-10:22536} weeks.   No orders of the defined types were placed in this encounter.   There are no discontinued medications.   No orders of the defined types were placed in this encounter.     Objective:   VITALS: Per patient if applicable, see vitals. GENERAL: Alert and in no acute distress. CARDIOPULMONARY: No increased WOB. Speaking in clear sentences.  PSYCH: Pleasant and cooperative. Speech normal rate and rhythm. Affect is appropriate. Insight and judgement are appropriate. Attention is focused, linear, and appropriate.  NEURO: Oriented as arrived to appointment on time with no prompting.   Attestation Statements:   This  was prepared with the assistance of Engineer, civil (consulting).  Occasional wrong-word or sound-a-like substitutions may have occurred due to the inherent limitations of voice recognition

## 2024-01-22 ENCOUNTER — Telehealth: Payer: Self-pay | Admitting: Family

## 2024-01-22 NOTE — Telephone Encounter (Signed)
 Copied from CRM [id number]. Topic: Medical Record Request - Other >> Jan 22, 2024  3:33 PM Deaijah H wrote: Reason for CRM: Patient would like Melissa O'Sullivan to give a call and tell her if her blood type is in her medical records. Please call [phone]

## 2024-01-24 NOTE — Telephone Encounter (Signed)
 Called but no answer, left voice mail with B positive blood type results from 2008.

## 2024-02-13 ENCOUNTER — Encounter: Payer: Self-pay | Admitting: Bariatrics

## 2024-02-13 ENCOUNTER — Ambulatory Visit: Admitting: Bariatrics

## 2024-02-13 ENCOUNTER — Other Ambulatory Visit (HOSPITAL_BASED_OUTPATIENT_CLINIC_OR_DEPARTMENT_OTHER): Payer: Self-pay

## 2024-02-13 VITALS — BP 129/74 | HR 83 | Temp 97.9°F | Ht 61.0 in | Wt 143.0 lb

## 2024-02-13 DIAGNOSIS — R632 Polyphagia: Secondary | ICD-10-CM

## 2024-02-13 DIAGNOSIS — E669 Obesity, unspecified: Secondary | ICD-10-CM

## 2024-02-13 DIAGNOSIS — E88819 Insulin resistance, unspecified: Secondary | ICD-10-CM | POA: Diagnosis not present

## 2024-02-13 DIAGNOSIS — Z6827 Body mass index (BMI) 27.0-27.9, adult: Secondary | ICD-10-CM | POA: Diagnosis not present

## 2024-02-13 MED ORDER — TIRZEPATIDE 10 MG/0.5ML ~~LOC~~ SOAJ
10.0000 mg | SUBCUTANEOUS | 0 refills | Status: DC
  Filled 2024-02-13: qty 2, 28d supply, fill #0

## 2024-02-13 NOTE — Progress Notes (Signed)
 WEIGHT SUMMARY AND BIOMETRICS  Weight Lost Since Last Visit: 4lb  Weight Gained Since Last Visit: 0   Vitals Temp: 97.9 F (36.6 C) BP: 129/74 Pulse Rate: 83 SpO2: 100 %   Anthropometric Measurements Height: 5' 1 (1.549 m) Weight: 143 lb (64.9 kg) BMI (Calculated): 27.03 Weight at Last Visit: 147lb Weight Lost Since Last Visit: 4lb Weight Gained Since Last Visit: 0 Starting Weight: 205lb Total Weight Loss (lbs): 62 lb (28.1 kg)   Body Composition  Body Fat %: 32.6 % Fat Mass (lbs): 46.8 lbs Muscle Mass (lbs): 91.8 lbs Total Body Water (lbs): 66.4 lbs Visceral Fat Rating : 6   Other Clinical Data Fasting: no Labs: no Today's Visit #: 34 Starting Date: 03/25/21    OBESITY Shelley Ramirez is here to discuss her progress with her obesity treatment plan along with follow-up of her obesity related diagnoses.    Nutrition Plan: the Category 1 plan - 85% adherence.  Current exercise: none  Interim History:  She is down 4 lbs since her last visit.  Eating all of the food on the plan., Protein intake is as prescribed, Is not skipping meals, Water intake is adequate., and Denies polyphagia   Pharmacotherapy: Shelley Ramirez is on Mounjaro  7.5 mg SQ weekly Adverse side effects: None Hunger is moderately controlled.  Cravings are moderately controlled.  Assessment/Plan:   Polyphagia Shelley Ramirez endorses excessive hunger.  Medication(s): Mounjaro  7.5 mg Effects of medication:  moderately controlled. Cravings are moderately controlled.   Plan: Medication(s): Mounjaro  10 mg SQ weekly Will increase water, protein and fiber to help assuage hunger.  Will minimize foods that have a high glucose index/load to minimize reactive hypoglycemia.  Will continue to do cardio and add in more resistance training.    Insulin  Resistance Shelley Ramirez has had elevated fasting insulin  readings.  Goal is HgbA1c < 5.7, fasting insulin  at l0 or less, and preferably at 5.  She reports some increasing polyphagia. Medication(s): Mounjaro  Lab Results  Component Value Date   HGBA1C 5.0 08/23/2023   Lab Results  Component Value Date   INSULIN  7.5 08/23/2023   INSULIN  6.5 12/14/2022   INSULIN  17.8 04/19/2022   INSULIN  10.2 10/07/2021   INSULIN  8.5 03/25/2021    Plan Medication(s): Mounjaro  10 mg SQ weekly (Increase from Mounjaro  7.5 mg) Will work on the agreed upon plan. Will minimize refined carbohydrates ( sweets and starches), and focus more on complex carbohydrates.  Increase the micronutrients found in leafy greens, which include magnesium, polyphenols, and vitamin C which have been postulated to help with insulin  sensitivity. Minimize fast food and cook more meals at home.  Increase fiber to 25 to 30 grams daily.  Will continue to do more meal planning. Will do less eating out.    Generalized Obesity: Current BMI BMI (Calculated): 27.03   Pharmacotherapy Plan Continue and refill  Mounjaro  10 mg SQ weekly  Shelley Ramirez is currently in the action stage of change. As such, her goal is to continue with weight loss efforts.  She has agreed to the Category 1 plan.  Exercise goals: All adults should avoid inactivity. Some physical activity is better than none, and adults who participate in any amount of physical activity gain some health benefits.  Behavioral modification strategies: increasing lean protein intake, no meal skipping, meal planning , increase water intake, better snacking choices, planning for success, avoiding temptations, keep healthy foods in the home, weigh protein portions, measure portion sizes, and mindful eating.  Shelley Ramirez has agreed to follow-up with our clinic in 4 weeks.   Objective:   VITALS: Per patient if applicable, see vitals. GENERAL: Alert and in no acute distress. CARDIOPULMONARY: No increased WOB. Speaking in clear sentences.  PSYCH: Pleasant and  cooperative. Speech normal rate and rhythm. Affect is appropriate. Insight and judgement are appropriate. Attention is focused, linear, and appropriate.  NEURO: Oriented as arrived to appointment on time with no prompting.   Attestation Statements:   This was prepared with the assistance of Engineer, civil (consulting).  Occasional wrong-word or sound-a-like substitutions may have occurred due to the inherent limitations of voice recognition   Clayborne Daring, DO

## 2024-03-01 ENCOUNTER — Other Ambulatory Visit: Payer: Self-pay | Admitting: Family

## 2024-03-01 ENCOUNTER — Ambulatory Visit: Payer: Self-pay

## 2024-03-01 NOTE — Telephone Encounter (Signed)
 Copied from CRM #[id number]. Topic: Clinical - Medication Refill >> Mar 01, 2024  9:44 AM Rosina BIRCH wrote: Medication: SUMAtriptan  (IMITREX ) 50 MG tablet  Has the patient contacted their pharmacy? Yes (Agent: If no, request that the patient contact the pharmacy for the refill. If patient does not wish to contact the pharmacy document the reason why and proceed with request.) (Agent: If yes, when and what did the pharmacy advise?)  This is the patient's preferred pharmacy:  [id number] - [place], [place] - [address] [place] [address] Phone: [phone] Fax: [phone]  Is this the correct pharmacy for this prescription? Yes If no, delete pharmacy and type the correct one.   Has the prescription been filled recently? No  Is the patient out of the medication? Yes  Has the patient been seen for an appointment in the last year OR does the patient have an upcoming appointment? Yes  Can we respond through MyChart? Yes  Agent: Please be advised that Rx refills may take up to 3 business days. We ask that you follow-up with your pharmacy.

## 2024-03-01 NOTE — Telephone Encounter (Addendum)
 FYI Only or Action Required?: FYI only for provider.  Patient was last seen in primary care on 02/13/2024 by Delores Shields A, DO.  Called Nurse Triage reporting Headache.  Symptoms began Ongoing.  Interventions attempted: Prescription medications: SUMAtriptan  (IMITREX ) 50 MG tablet.  Symptoms are: stable.  Triage Disposition: Call PCP When Office is Open  Patient/caregiver understands and will follow disposition?: Yes   **Patient requesting SUMAtriptan  (IMITREX ) 50 MG tablet refill/refill request sent**       Copied from CRM #[id number]. Topic: Clinical - Red Word Triage >> Mar 01, 2024  9:48 AM Rosina BIRCH wrote: Red Word that prompted transfer to Nurse Triage: migraines on and off this week due to her cycle coming on Reason for Disposition  [1] Caller requesting NON-URGENT health information AND [2] PCP's office is the best resource  Answer Assessment - Initial Assessment Questions 1. LOCATION: Where does it hurt?      Right side  2. ONSET: When did the headache start? (e.g., minutes, hours, days)      When her cycles start, this happens. X 1 hour  3. PATTERN: Does the pain come and go, or has it been constant since it started?     Intermittent   4. SEVERITY: How bad is the pain? and What does it keep you from doing?  (e.g., Scale 1-10; mild, moderate, or severe)     4/10  5. RECURRENT SYMPTOM: Have you ever had headaches before? If Yes, ask: When was the last time? and What happened that time?      Yes ongoing   6. CAUSE: What do you think is causing the headache?     Unknown  7. MIGRAINE: Have you been diagnosed with migraine headaches? If Yes, ask: Is this headache similar?      Yes  8. HEAD INJURY: Has there been any recent injury to your head?      No   9. OTHER SYMPTOMS: Do you have any other symptoms? (e.g., fever, stiff neck, eye pain, sore throat, cold symptoms)  No   Patient was just calling for a RX. Refill IMITREX   Protocols  used: Headache-A-AH, Information Only Call - No Triage-A-AH

## 2024-03-14 ENCOUNTER — Ambulatory Visit: Admitting: Bariatrics

## 2024-03-21 ENCOUNTER — Other Ambulatory Visit: Payer: Self-pay

## 2024-03-21 ENCOUNTER — Other Ambulatory Visit: Payer: Self-pay | Admitting: Bariatrics

## 2024-03-21 ENCOUNTER — Encounter: Payer: Self-pay | Admitting: Family

## 2024-03-21 ENCOUNTER — Telehealth: Payer: Self-pay | Admitting: Bariatrics

## 2024-03-21 ENCOUNTER — Other Ambulatory Visit (HOSPITAL_BASED_OUTPATIENT_CLINIC_OR_DEPARTMENT_OTHER): Payer: Self-pay

## 2024-03-21 DIAGNOSIS — R110 Nausea: Secondary | ICD-10-CM

## 2024-03-21 MED ORDER — TIRZEPATIDE 10 MG/0.5ML ~~LOC~~ SOAJ
10.0000 mg | SUBCUTANEOUS | 0 refills | Status: DC
  Filled 2024-03-21: qty 2, 28d supply, fill #0

## 2024-03-21 MED ORDER — ONDANSETRON 4 MG PO TBDP
4.0000 mg | ORAL_TABLET | Freq: Three times a day (TID) | ORAL | 0 refills | Status: AC | PRN
Start: 2024-03-21 — End: ?
  Filled 2024-03-21: qty 30, 10d supply, fill #0
  Filled 2024-03-21: qty 18, 6d supply, fill #0

## 2024-03-21 NOTE — Telephone Encounter (Signed)
 Patient needs a refill of Zofran  and Mounjaro 

## 2024-04-22 ENCOUNTER — Other Ambulatory Visit (HOSPITAL_BASED_OUTPATIENT_CLINIC_OR_DEPARTMENT_OTHER): Payer: Self-pay

## 2024-04-22 ENCOUNTER — Ambulatory Visit: Admitting: Bariatrics

## 2024-04-22 ENCOUNTER — Encounter: Payer: Self-pay | Admitting: Bariatrics

## 2024-04-22 VITALS — BP 124/75 | HR 77 | Temp 98.6°F | Ht 61.0 in | Wt 140.0 lb

## 2024-04-22 DIAGNOSIS — Z6826 Body mass index (BMI) 26.0-26.9, adult: Secondary | ICD-10-CM

## 2024-04-22 DIAGNOSIS — E88819 Insulin resistance, unspecified: Secondary | ICD-10-CM

## 2024-04-22 DIAGNOSIS — E669 Obesity, unspecified: Secondary | ICD-10-CM

## 2024-04-22 DIAGNOSIS — R632 Polyphagia: Secondary | ICD-10-CM

## 2024-04-22 MED ORDER — TIRZEPATIDE 10 MG/0.5ML ~~LOC~~ SOAJ
10.0000 mg | SUBCUTANEOUS | 0 refills | Status: DC
  Filled 2024-04-22 – 2024-05-20 (×2): qty 2, 28d supply, fill #0

## 2024-04-22 NOTE — Progress Notes (Signed)
 WEIGHT SUMMARY AND BIOMETRICS  Weight Lost Since Last Visit: 3lb  Weight Gained Since Last Visit: 0   Vitals Temp: 98.6 F (37 C) BP: 124/75 Pulse Rate: 77 SpO2: 100 %   Anthropometric Measurements Height: 5' 1 (1.549 m) Weight: 140 lb (63.5 kg) BMI (Calculated): 26.47 Weight at Last Visit: 143lb Weight Lost Since Last Visit: 3lb Weight Gained Since Last Visit: 0 Starting Weight: 205lb Total Weight Loss (lbs): 65 lb (29.5 kg)   Body Composition  Body Fat %: 30 % Fat Mass (lbs): 42.2 lbs Muscle Mass (lbs): 93.6 lbs Total Body Water (lbs): 66.6 lbs Visceral Fat Rating : 5   Other Clinical Data Fasting: no Labs: no Today's Visit #: 35 Starting Date: 03/25/21    OBESITY Tahani is here to discuss her progress with her obesity treatment plan along with follow-up of her obesity related diagnoses.    Nutrition Plan: the Category 1 plan - 90% adherence.  Current exercise: walking  Interim History:  She is down anotther 3 lbs since her last visit.  Eating all of the food on the plan., Protein intake is as prescribed, Is not skipping meals, and Water intake is adequate.   Pharmacotherapy: Talia is on Mounjaro  10 mg SQ weekly Adverse side effects: None Hunger is moderately controlled.  Cravings are moderately controlled.  Assessment/Plan:   Polyphagia Rex endorses excessive hunger.  Medication(s): Mounjaro  10 mg Effects of medication:  moderately controlled. Cravings are moderately controlled.   Plan: Medication(s): Mounjaro  10 mg SQ weekly Will increase water, protein and fiber to help assuage hunger.  Will minimize foods that have a high glucose index/load to minimize reactive hypoglycemia.   Insulin  Resistance Anjelika has had elevated fasting insulin  readings. Goal is HgbA1c < 5.7, fasting insulin  at l0 or less, and preferably at 5.  She  denies polyphagia with her medication.  Medication(s): Mounjaro  Lab Results  Component Value Date   HGBA1C 5.0 08/23/2023   Lab Results  Component Value Date   INSULIN  7.5 08/23/2023   INSULIN  6.5 12/14/2022   INSULIN  17.8 04/19/2022   INSULIN  10.2 10/07/2021   INSULIN  8.5 03/25/2021    Plan Medication(s): Mounjaro  10 mg SQ weekly Will work on the agreed upon plan. Will minimize refined carbohydrates ( sweets and starches), and focus more on complex carbohydrates.  Increase the micronutrients found in leafy greens, which include magnesium, polyphenols, and vitamin C which have been postulated to help with insulin  sensitivity. Minimize fast food and cook more meals at home.  Will continue to journal on a regular basis.     Generalized Obesity: Current BMI BMI (Calculated): 26.47   Pharmacotherapy Plan Continue and refill  Mounjaro  10 mg SQ weekly  Naylea is currently in the action stage of change. As such, her goal is to continue with weight loss efforts.  She has agreed to the Category 1 plan.  Exercise goals: All adults should avoid inactivity. Some physical activity is better than none, and adults who participate in any amount of physical activity gain some health benefits.  Behavioral modification strategies: increasing lean protein intake, no meal skipping, decrease eating out, meal planning , increase water intake, better snacking choices, planning for success, increasing vegetables, decrease snacking , and avoiding temptations.  Janille has agreed to follow-up with our clinic in 4 weeks.   Objective:   VITALS: Per patient if applicable, see vitals. GENERAL: Alert and in no acute distress. CARDIOPULMONARY: No increased WOB. Speaking in clear sentences.  PSYCH: Pleasant and cooperative. Speech normal rate and rhythm. Affect is appropriate. Insight and judgement are appropriate. Attention is focused, linear, and appropriate.  NEURO: Oriented as arrived to appointment on  time with no prompting.   Attestation Statements:   This was prepared with the assistance of Engineer, civil (consulting).  Occasional wrong-word or sound-a-like substitutions may have occurred due to the inherent limitations of voice recognition   Clayborne Daring, DO

## 2024-05-02 ENCOUNTER — Other Ambulatory Visit (HOSPITAL_BASED_OUTPATIENT_CLINIC_OR_DEPARTMENT_OTHER): Payer: Self-pay

## 2024-05-20 ENCOUNTER — Ambulatory Visit: Admitting: Bariatrics

## 2024-05-20 ENCOUNTER — Other Ambulatory Visit (HOSPITAL_BASED_OUTPATIENT_CLINIC_OR_DEPARTMENT_OTHER): Payer: Self-pay

## 2024-05-21 ENCOUNTER — Other Ambulatory Visit: Payer: Self-pay | Admitting: Family

## 2024-05-22 ENCOUNTER — Ambulatory Visit: Admitting: Bariatrics

## 2024-05-30 ENCOUNTER — Encounter: Payer: Self-pay | Admitting: Bariatrics

## 2024-05-30 ENCOUNTER — Other Ambulatory Visit (HOSPITAL_BASED_OUTPATIENT_CLINIC_OR_DEPARTMENT_OTHER): Payer: Self-pay

## 2024-05-30 ENCOUNTER — Ambulatory Visit: Admitting: Bariatrics

## 2024-05-30 ENCOUNTER — Other Ambulatory Visit: Payer: Self-pay | Admitting: Family

## 2024-05-30 VITALS — BP 118/79 | HR 86 | Ht 61.0 in | Wt 139.0 lb

## 2024-05-30 DIAGNOSIS — Z6826 Body mass index (BMI) 26.0-26.9, adult: Secondary | ICD-10-CM

## 2024-05-30 DIAGNOSIS — E669 Obesity, unspecified: Secondary | ICD-10-CM

## 2024-05-30 DIAGNOSIS — E88819 Insulin resistance, unspecified: Secondary | ICD-10-CM | POA: Diagnosis not present

## 2024-05-30 DIAGNOSIS — R632 Polyphagia: Secondary | ICD-10-CM

## 2024-05-30 MED ORDER — TIRZEPATIDE 10 MG/0.5ML ~~LOC~~ SOAJ
10.0000 mg | SUBCUTANEOUS | 0 refills | Status: DC
  Filled 2024-06-21: qty 2, 28d supply, fill #0

## 2024-05-30 NOTE — Progress Notes (Signed)
 WEIGHT SUMMARY AND BIOMETRICS  Weight Lost Since Last Visit: 1lb  Weight Gained Since Last Visit: 0   Vitals BP: 118/79 Pulse Rate: 86 SpO2: 100 %   Anthropometric Measurements Height: 5' 1 (1.549 m) Weight: 139 lb (63 kg) BMI (Calculated): 26.28 Weight at Last Visit: 140lb Weight Lost Since Last Visit: 1lb Weight Gained Since Last Visit: 0 Starting Weight: 205lb Total Weight Loss (lbs): 66 lb (29.9 kg)   Body Composition  Body Fat %: 31.1 % Fat Mass (lbs): 43.4 lbs Muscle Mass (lbs): 91.4 lbs Total Body Water (lbs): 65.8 lbs Visceral Fat Rating : 6   Other Clinical Data Fasting: no Labs: no Today's Visit #: 5 Starting Date: 03/25/21    OBESITY Shelley Ramirez is here to discuss her progress with her obesity treatment plan along with follow-up of her obesity related diagnoses.    Nutrition Plan: the Category 1 plan - 90% adherence.  Current exercise: none  Interim History:  She is down 1 lb since her last visit. She is maintaining well.  Eating all of the food on the plan., Protein intake is as prescribed, Is not skipping meals, Not journaling consistently., and Water intake is adequate.   Pharmacotherapy: Shelley Ramirez is on Mounjaro  10 mg SQ weekly Adverse side effects: None Hunger is moderately controlled.  Cravings are moderately controlled.  Assessment/Plan:   Insulin  Resistance Shelley Ramirez has had elevated fasting insulin  readings. Goal is HgbA1c < 5.7, fasting insulin  at l0 or less, and preferably at 5.  She denies polyphagia. Medication(s): Mounjaro  10 mg Lab Results  Component Value Date   HGBA1C 5.0 08/23/2023   Lab Results  Component Value Date   INSULIN  7.5 08/23/2023   INSULIN  6.5 12/14/2022   INSULIN  17.8 04/19/2022   INSULIN  10.2 10/07/2021   INSULIN  8.5 03/25/2021    Plan Medication(s): Mounjaro  10 mg SQ weekly Will work on the  agreed upon plan. Will minimize refined carbohydrates ( sweets and starches), and focus more on complex carbohydrates.  Increase the micronutrients found in leafy greens, which include magnesium, polyphenols, and vitamin C which have been postulated to help with insulin  sensitivity. Minimize fast food and cook more meals at home.  Increase fiber to 25 to 30 grams daily.   Insulin  Resistance Shelley Ramirez has had elevated fasting insulin  readings. Goal is HgbA1c < 5.7, fasting insulin  at l0 or less, and preferably at 5.  She denies polyphagia. Medication(s): Mounjaro  10 mg Lab Results  Component Value Date   HGBA1C 5.0 08/23/2023   Lab Results  Component Value Date   INSULIN  7.5 08/23/2023   INSULIN  6.5 12/14/2022   INSULIN  17.8 04/19/2022   INSULIN  10.2 10/07/2021   INSULIN  8.5 03/25/2021    Plan Medication(s): Mounjaro  10 mg SQ weekly Will work on the agreed upon plan. Will minimize refined carbohydrates ( sweets and starches), and focus more on complex carbohydrates.  Increase the micronutrients found in leafy greens, which include magnesium, polyphenols, and vitamin C which have been postulated to help with insulin  sensitivity. Minimize fast food and cook more meals at home.      Generalized Obesity: Current BMI BMI (Calculated): 26.28   Pharmacotherapy Plan Continue and refill  Mounjaro  10 mg SQ weekly  Shelley Ramirez is currently in the action stage of change. As such, her goal is to continue with weight loss efforts.  She has agreed to the Category 1 plan.  Exercise goals: For substantial health benefits, adults should do at least 150 minutes (2 hours and 30 minutes) a week of moderate-intensity, or 75 minutes (1 hour and 15 minutes) a week of vigorous-intensity aerobic physical activity, or an equivalent combination of moderate- and vigorous-intensity aerobic activity. Aerobic activity should be performed in episodes of at least 10 minutes, and preferably, it should be spread  throughout the week.  Behavioral modification strategies: increasing lean protein intake, no meal skipping, meal planning , increase water intake, better snacking choices, planning for success, increasing vegetables, decrease snacking , avoiding temptations, keep healthy foods in the home, and increase frequency of journaling.  Shelley Ramirez has agreed to follow-up with our clinic in 4 weeks for an indirect calorimetry and fasting labs      Objective:   VITALS: Per patient if applicable, see vitals. GENERAL: Alert and in no acute distress. CARDIOPULMONARY: No increased WOB. Speaking in clear sentences.  PSYCH: Pleasant and cooperative. Speech normal rate and rhythm. Affect is appropriate. Insight and judgement are appropriate. Attention is focused, linear, and appropriate.  NEURO: Oriented as arrived to appointment on time with no prompting.   Attestation Statements:   This was prepared with the assistance of Engineer, Civil (consulting).  Occasional wrong-word or sound-a-like substitutions may have occurred due to the inherent limitations of voice recognition   Clayborne Daring, DO

## 2024-06-21 ENCOUNTER — Other Ambulatory Visit (HOSPITAL_BASED_OUTPATIENT_CLINIC_OR_DEPARTMENT_OTHER): Payer: Self-pay

## 2024-07-01 ENCOUNTER — Other Ambulatory Visit (INDEPENDENT_AMBULATORY_CARE_PROVIDER_SITE_OTHER): Payer: Self-pay | Admitting: Bariatrics

## 2024-07-01 ENCOUNTER — Other Ambulatory Visit (HOSPITAL_BASED_OUTPATIENT_CLINIC_OR_DEPARTMENT_OTHER): Payer: Self-pay

## 2024-07-01 ENCOUNTER — Telehealth: Payer: Self-pay | Admitting: Bariatrics

## 2024-07-01 MED ORDER — TIRZEPATIDE 10 MG/0.5ML ~~LOC~~ SOAJ
10.0000 mg | SUBCUTANEOUS | 0 refills | Status: DC
  Filled 2024-07-01 – 2024-07-17 (×2): qty 2, 28d supply, fill #0

## 2024-07-01 NOTE — Telephone Encounter (Signed)
 Patient is needing a refill of Monujaro. She had an appointment on 12/9 but we had to cancel on her due to our three hour delay. Her next appointment is 08/08/24. Please send to [facility] [place] [facility]  [address], [place] [address]

## 2024-07-02 ENCOUNTER — Ambulatory Visit: Admitting: Bariatrics

## 2024-07-04 ENCOUNTER — Telehealth: Payer: Self-pay

## 2024-07-04 ENCOUNTER — Telehealth: Payer: Self-pay | Admitting: Family

## 2024-07-04 NOTE — Telephone Encounter (Signed)
FYI. Pt has an appt with you tomorrow.

## 2024-07-04 NOTE — Telephone Encounter (Signed)
 Copied from CRM #[id number]. Topic: Clinical - Request for Lab/Test Order >> Jul 04, 2024  8:31 AM Laymon HERO wrote: Reason for CRM: Patient wanting to get an order for labs to check her iron  levels for tomorrow. Please contact patient.

## 2024-07-04 NOTE — Telephone Encounter (Signed)
 Pt has an appointment sched for 12/12 because she wants her iron  checked. She wants to know if she needs an office visit or if she could just get the labs drawn. She also wants to make sure that these labs would not be fasting. Please advise

## 2024-07-04 NOTE — Telephone Encounter (Signed)
 Spoke w/ Pt- informed we received message earlier of her request for iron  check tomorrow and was sent to [facility]. She does not need to be fasting.

## 2024-07-05 ENCOUNTER — Ambulatory Visit: Admitting: Family

## 2024-07-05 VITALS — BP 110/61 | HR 84 | Temp 98.0°F | Resp 16 | Ht 60.0 in | Wt 146.0 lb

## 2024-07-05 DIAGNOSIS — G43909 Migraine, unspecified, not intractable, without status migrainosus: Secondary | ICD-10-CM

## 2024-07-05 DIAGNOSIS — R5383 Other fatigue: Secondary | ICD-10-CM | POA: Diagnosis not present

## 2024-07-05 MED ORDER — AMITRIPTYLINE HCL 10 MG PO TABS: 10.0000 mg | ORAL_TABLET | Freq: Every day | ORAL | 2 refills | Status: AC

## 2024-07-05 MED ORDER — SUMATRIPTAN SUCCINATE 50 MG PO TABS: 50.0000 mg | ORAL_TABLET | Freq: Every day | ORAL | 2 refills | Status: AC

## 2024-07-05 NOTE — Progress Notes (Signed)
 Subjective:     Patient ID: Shelley Ramirez, female    DOB: [birth year], 43 y.o.   MRN: [id number]  Chief Complaint  Patient presents with   Fatigue    Patient having low energy symptoms   Anemia    Patient will like to have iron  levels check   Migraine    Here for follow up    Anemia  Migraine     Discussed the use of AI scribe software for clinical note transcription with the patient, who gave verbal consent to proceed.  History of Present Illness Shelley Ramirez is a 43 year old female who presents with fatigue, cold intolerance, and migraines.  She has been experiencing significant fatigue for the past three weeks to a month, which she describes as severe and more intense than usual. She attributes this to her work but notes that it is more severe than usual. She also reports difficulty sleeping and feeling cold even when the heat is on at home.  She mentions having heavy menstrual periods, which have been ongoing. She has a history of low iron  levels and has previously received iron  infusions. She is concerned about her blood count and iron  levels, as well as her thyroid  function, and inquires about checking B12 levels.  She experiences severe migraines, particularly two weeks before her menstrual cycle. These migraines have worsened, similar to when her iron  levels were previously low. She has not been seeing a specialist for her migraines but has consulted with someone at [facility] who suggested a new shot, which she declined. She has tried Excedrin and other medications in the past without relief. She has previously been prescribed amitriptyline  for sleep, which helped slightly but caused grogginess. She has also tried Topamax in the past without success. Sumatriptan  is the only medication that effectively alleviates her migraines.  She is currently on Mounjaro  for weight management, having lost 50 to 60 pounds since starting the medication. She pays out of pocket for  this medication as her insurance does not cover it. She is concerned about upcoming changes to her insurance coverage and its impact on her ability to continue receiving care and medications.  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 She discusses the challenges of managing her business and the impact of health insurance changes on her and her family.      Health Maintenance Due  Topic Date Due   HIV Screening  Never done   DTaP/Tdap/Td (1 - Tdap) Never done   Hepatitis B Vaccines 19-59 Average Risk (1 of 3 - 19+ 3-dose series) Never done   HPV VACCINES (1 - 3-dose SCDM series) Never done   COVID-19 Vaccine (1 - 2025-26 season) Never done    Past Medical History:  Diagnosis Date   Acid reflux    Anemia    Lactose intolerance    Other fatigue    SOB (shortness of breath) on exertion    Vitamin D  deficiency     Past Surgical History:  Procedure Laterality Date   BREAST BIOPSY Left 04/26/2023   US  LT BREAST BX W LOC DEV 1ST LESION IMG BX SPEC US  GUIDE 04/26/2023 [facility]   CESAREAN SECTION     14years ago    Family History  Problem Relation Age of Onset   Hypertension Mother    Kidney disease Mother    Sudden death Mother        died from allergic reaction to medication  Stroke Father        x 2   Hypertension Father    Alcoholism Father    Fibroids Sister        s/p hysterectomy   Diabetes Mellitus II Maternal Grandmother    Cancer Maternal Aunt    Diabetes Maternal Aunt     Social History   Socioeconomic History   Marital status: Married    Spouse name: Not on file   Number of children: Not on file   Years of education: Not on file   Highest education level: Associate degree: academic program  Occupational History   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  Tobacco Use   Smoking status: Never   Smokeless tobacco: Never  Vaping Use   Vaping status: Never Used  Substance and Sexual Activity    Alcohol use: No   Drug use: No   Sexual activity: Yes    Partners: Male    Comment: not on birth control  Other Topics Concern   Not on file  Social History Narrative   Right handed    Caffeine 1 daily   Lives in two story home , with husband and brother   She owns a substance abuse counseling business in [place]    Married   One son born 1999- lives in [place], he is a runner, broadcasting/film/video   Has one step son and one step daughter- both adults   Completed bachelors degree   Enjoys zumba, church, reading   No pets   Social Drivers of Health   Tobacco Use: Low Risk (05/30/2024)   Patient History    Smoking Tobacco Use: Never    Smokeless Tobacco Use: Never    Passive Exposure: Not on file  Financial Resource Strain: Low Risk (04/26/2023)   Overall Financial Resource Strain (CARDIA)    Difficulty of Paying Living Expenses: Not hard at all  Food Insecurity: No Food Insecurity (04/26/2023)   Hunger Vital Sign    Worried About Running Out of Food in the Last Year: Never true    Ran Out of Food in the Last Year: Never true  Transportation Needs: No Transportation Needs (04/26/2023)   PRAPARE - Administrator, Civil Service (Medical): No    Lack of Transportation (Non-Medical): No  Physical Activity: Insufficiently Active (04/26/2023)   Exercise Vital Sign    Days of Exercise per Week: 1 day    Minutes of Exercise per Session: 10 min  Stress: No Stress Concern Present (04/26/2023)   [employer] of Occupational Health - Occupational Stress Questionnaire    Feeling of Stress : Not at all  Social Connections: Socially Integrated (04/26/2023)   Social Connection and Isolation Panel    Frequency of Communication with Friends and Family: More than three times a week    Frequency of Social Gatherings with Friends and Family: Twice a week    Attends Religious Services: More than 4 times per year    Active Member of [employer] or Organizations: Yes    Attends Banker Meetings:  More than 4 times per year    Marital Status: Married  Catering Manager Violence: Not on file  Depression (PHQ2-9): Low Risk (07/05/2024)   Depression (PHQ2-9)    PHQ-2 Score: 3  Alcohol Screen: Not on file  Housing: Low Risk (04/26/2023)   Housing    Last Housing Risk Score: 0  Utilities: Not on file  Health Literacy: Not on file    Outpatient  Medications Prior to Visit  Medication Sig Dispense Refill   Cholecalciferol  (VITAMIN D3) 1.25 MG (50000 UT) CAPS Take 1 capsule (1.25 mg total) by mouth every 7 (seven) days. 5 capsule 0   Clindamycin -Benzoyl Per, Refr, gel Apply to face every other morning 45 g 11   EPINEPHrine  (EPIPEN  2-PAK) 0.3 mg/0.3 mL IJ SOAJ injection Inject 0.3 mg into the muscle as needed for anaphylaxis. Use in event of severe allergic reaction (throat closing, unable to breath, about to pass out or faint). 2 each 1   ondansetron  (ZOFRAN -ODT) 4 MG disintegrating tablet Take 1 tablet (4 mg total) by mouth every 8 (eight) hours as needed for nausea or vomiting. 30 tablet 0   tirzepatide  (MOUNJARO ) 10 MG/0.5ML Pen Inject 10 mg into the skin once a week. 2 mL 0   traZODone  (DESYREL ) 50 MG tablet Take 0.5-1 tablets (25-50 mg total) by mouth at bedtime as needed for sleep. 30 tablet 3   tretinoin  (RETIN-A ) 0.025 % cream Apply 1/2 dime sized amount on cheeks and chin nightly to tolerance. 45 g 3   SUMAtriptan  (IMITREX ) 50 MG tablet TAKE 1 TABLET(50 MG) BY MOUTH EVERY 2 HOURS AS NEEDED FOR MIGRAINE. MAY REPEAT IN 2 HOURS IF HEADACHE PERSISTS OR RECURS 30 tablet 0   No facility-administered medications prior to visit.    Allergies[1]  ROS See HPI    Objective:    Physical Exam Constitutional:      General: She is not in acute distress.    Appearance: Normal appearance. She is well-developed.  HENT:     Head: Normocephalic and atraumatic.     Right Ear: External ear normal.     Left Ear: External ear normal.  Eyes:     General: No scleral icterus. Neck:      Thyroid : No thyromegaly.  Cardiovascular:     Rate and Rhythm: Normal rate and regular rhythm.     Heart sounds: Normal heart sounds. No murmur heard. Pulmonary:     Effort: Pulmonary effort is normal. No respiratory distress.     Breath sounds: Normal breath sounds. No wheezing.  Musculoskeletal:     Cervical back: Neck supple.  Skin:    General: Skin is warm and dry.  Neurological:     Mental Status: She is alert and oriented to person, place, and time.  Psychiatric:        Mood and Affect: Mood normal.        Behavior: Behavior normal.        Thought Content: Thought content normal.        Judgment: Judgment normal.      BP 110/61 (BP Location: Right Arm, Patient Position: Sitting, Cuff Size: Small)   Pulse 84   Temp 98 F (36.7 C) (Oral)   Resp 16   Ht 5' (1.524 m)   Wt 146 lb (66.2 kg)   SpO2 100%   BMI 28.51 kg/m  Wt Readings from Last 3 Encounters:  07/05/24 146 lb (66.2 kg)  05/30/24 139 lb (63 kg)  04/22/24 140 lb (63.5 kg)       Assessment & Plan:   Problem List Items Addressed This Visit       Unprioritized   Migraines    Migraines worsened premenstrually, linked to low iron . Sumatriptan  effective for rescue. Amitriptyline  considered for prevention and sleep, with Topamax as backup. - Prescribed amitriptyline  10 mg for migraine prevention and sleep. - Consider increasing amitriptyline  to 25 mg if tolerated. - Consider Topamax  if amitriptyline  is ineffective.      Relevant Medications   SUMAtriptan  (IMITREX ) 50 MG tablet   amitriptyline  (ELAVIL ) 10 MG tablet   Fatigue - Primary    Fatigue with cold intolerance and heavy menstrual periods. Consider iron  deficiency anemia or thyroid  dysfunction. - Ordered complete blood count and iron  studies. - Ordered thyroid  function tests. - Ordered vitamin B12 level.       Relevant Orders   TSH   B12   CBC w/Diff   Iron , TIBC and Ferritin Panel   Assessment & Plan     I have changed Jniya Ogburn  Graves's SUMAtriptan . I am also having her start on amitriptyline . Additionally, I am having her maintain her EPINEPHrine , Clindamycin -Benzoyl Per (Refr), tretinoin , traZODone , Vitamin D3, ondansetron , and tirzepatide .  Meds ordered this encounter  Medications   SUMAtriptan  (IMITREX ) 50 MG tablet    Sig: Take 1 tablet (50 mg total) by mouth daily. May repeat in 2 hours if headache persists or recurs.    Dispense:  30 tablet    Refill:  2   amitriptyline  (ELAVIL ) 10 MG tablet    Sig: Take 1 tablet (10 mg total) by mouth at bedtime.    Dispense:  30 tablet    Refill:  2    Supervising Provider:   DOMENICA BLACKBIRD A [[id number]]      [1]  Allergies Allergen Reactions   Codeine Shortness Of Breath and Other (See Comments)    Hyperventilation, pass out, loss off consciousness     Clindamycin  Nausea And Vomiting   Penicillin G Rash

## 2024-07-05 NOTE — Patient Instructions (Signed)
°  VISIT SUMMARY: Today, you were seen for fatigue, cold intolerance, and migraines. You have been experiencing severe fatigue, difficulty sleeping, and feeling cold even when the heat is on. You also reported heavy menstrual periods and a history of low iron  levels. Additionally, you have been having severe migraines, especially two weeks before your menstrual cycle. We discussed your current medications and concerns about insurance coverage.  YOUR PLAN: -FATIGUE AND EVALUATION FOR IRON  DEFICIENCY ANEMIA: Your fatigue, cold intolerance, and heavy menstrual periods suggest that you might have iron  deficiency anemia or thyroid  dysfunction. We have ordered blood tests to check your iron  levels, thyroid  function, and vitamin B12 levels.  -MIGRAINE: Your migraines, which worsen before your menstrual cycle, may be linked to low iron  levels. Sumatriptan  has been effective for you. We have prescribed amitriptyline  10 mg to help prevent migraines and improve sleep. If you tolerate it well, we may increase the dose to 25 mg. If amitriptyline  is not effective, we may consider using Topamax.  -GENERAL HEALTH MAINTENANCE: We discussed options for managing medication costs through [facility]. You should contact Doctor Delores to send your prescription to [facility] for potential cost savings. Additionally, verify your insurance coverage and pricing with your credit card information.  INSTRUCTIONS: Please follow up with the lab tests we ordered to check your iron  levels, thyroid  function, and vitamin B12 levels. Contact Doctor Delores to send your prescription to [facility] for cost savings. Verify your insurance coverage and pricing with your credit card information.

## 2024-07-05 NOTE — Assessment & Plan Note (Signed)
°  Fatigue with cold intolerance and heavy menstrual periods. Consider iron  deficiency anemia or thyroid  dysfunction. - Ordered complete blood count and iron  studies. - Ordered thyroid  function tests. - Ordered vitamin B12 level.

## 2024-07-05 NOTE — Assessment & Plan Note (Signed)
°  Migraines worsened premenstrually, linked to low iron . Sumatriptan  effective for rescue. Amitriptyline  considered for prevention and sleep, with Topamax as backup. - Prescribed amitriptyline  10 mg for migraine prevention and sleep. - Consider increasing amitriptyline  to 25 mg if tolerated. - Consider Topamax if amitriptyline  is ineffective.

## 2024-07-06 LAB — CBC WITH DIFFERENTIAL/PLATELET
Absolute Lymphocytes: 1957 {cells}/uL (ref 850–3900)
Absolute Monocytes: 488 {cells}/uL (ref 200–950)
Basophils Absolute: 30 {cells}/uL (ref 0–200)
Basophils Relative: 0.9 %
Eosinophils Absolute: 129 {cells}/uL (ref 15–500)
Eosinophils Relative: 3.9 %
HCT: 30.8 % — ABNORMAL LOW (ref 35.9–46.0)
Hemoglobin: 8.8 g/dL — ABNORMAL LOW (ref 11.7–15.5)
MCH: 22.4 pg — ABNORMAL LOW (ref 27.0–33.0)
MCHC: 28.6 g/dL — ABNORMAL LOW (ref 31.6–35.4)
MCV: 78.4 fL — ABNORMAL LOW (ref 81.4–101.7)
MPV: 8.9 fL (ref 7.5–12.5)
Monocytes Relative: 14.8 %
Neutro Abs: 696 {cells}/uL — ABNORMAL LOW (ref 1500–7800)
Neutrophils Relative %: 21.1 %
Platelets: 519 Thousand/uL — ABNORMAL HIGH (ref 140–400)
RBC: 3.93 Million/uL (ref 3.80–5.10)
RDW: 14.3 % (ref 11.0–15.0)
Total Lymphocyte: 59.3 %
WBC: 3.3 Thousand/uL — ABNORMAL LOW (ref 3.8–10.8)

## 2024-07-06 LAB — TSH: TSH: 1.72 m[IU]/L

## 2024-07-06 LAB — VITAMIN B12: Vitamin B-12: 304 pg/mL (ref 200–1100)

## 2024-07-06 LAB — IRON,TIBC AND FERRITIN PANEL
%SAT: 4 % — ABNORMAL LOW (ref 16–45)
Ferritin: 4 ng/mL — ABNORMAL LOW (ref 16–232)
Iron: 19 ug/dL — ABNORMAL LOW (ref 40–190)
TIBC: 471 ug/dL — ABNORMAL HIGH (ref 250–450)

## 2024-07-08 ENCOUNTER — Ambulatory Visit: Payer: Self-pay | Admitting: Family

## 2024-07-08 ENCOUNTER — Ambulatory Visit: Payer: Self-pay

## 2024-07-08 NOTE — Telephone Encounter (Signed)
 Attempt # 1 to reach patient to triage symptoms. Left VM to call back    Copied from CRM #[id number]. Topic: Clinical - Red Word Triage >> Jul 08, 2024  9:29 AM Deleta RAMAN wrote: Red Word that prompted transfer to Nurse Triage: pt is calling due to lab results coming back on Saturday states her iron  is lower than it has ever been and would like to be scheduled for an iron  infusion. Also mentioned dizziness and weakness

## 2024-07-08 NOTE — Telephone Encounter (Signed)
 Shelley Ramirez, please advise pt that she is quite anemic.  This explains why she has been feeling so tired.  I would like to get her back in to see hematology.  Lauraine- could your office please reach out to her to arrange a follow up visit? Thanks!

## 2024-07-09 NOTE — Telephone Encounter (Signed)
 Pt scheduled with hematology on 07/11/24.

## 2024-07-11 ENCOUNTER — Inpatient Hospital Stay: Attending: Medical Oncology | Admitting: Medical Oncology

## 2024-07-11 ENCOUNTER — Inpatient Hospital Stay: Payer: Self-pay

## 2024-07-11 ENCOUNTER — Encounter: Payer: Self-pay | Admitting: Medical Oncology

## 2024-07-11 VITALS — BP 100/67 | HR 78 | Resp 18

## 2024-07-11 VITALS — BP 107/75 | HR 88 | Temp 98.2°F | Resp 17 | Ht 60.0 in | Wt 144.0 lb

## 2024-07-11 DIAGNOSIS — E538 Deficiency of other specified B group vitamins: Secondary | ICD-10-CM | POA: Insufficient documentation

## 2024-07-11 DIAGNOSIS — D500 Iron deficiency anemia secondary to blood loss (chronic): Secondary | ICD-10-CM | POA: Insufficient documentation

## 2024-07-11 DIAGNOSIS — N920 Excessive and frequent menstruation with regular cycle: Secondary | ICD-10-CM | POA: Insufficient documentation

## 2024-07-11 DIAGNOSIS — D508 Other iron deficiency anemias: Secondary | ICD-10-CM

## 2024-07-11 MED ORDER — METHYLPREDNISOLONE SODIUM SUCC 125 MG IJ SOLR: 125.0000 mg | Freq: Once | INTRAMUSCULAR | Status: DC | PRN

## 2024-07-11 MED ORDER — METHYLPREDNISOLONE SODIUM SUCC 125 MG IJ SOLR
125.0000 mg | Freq: Once | INTRAMUSCULAR | Status: AC
  Administered 2024-07-11: 14:00:00 125 mg via INTRAVENOUS

## 2024-07-11 MED ORDER — SODIUM CHLORIDE 0.9 % IV SOLN: Freq: Once | INTRAVENOUS | Status: AC

## 2024-07-11 MED ORDER — CYANOCOBALAMIN 1000 MCG/ML IJ SOLN
1000.0000 ug | Freq: Once | INTRAMUSCULAR | Status: AC
  Administered 2024-07-11: 10:00:00 1000 ug via INTRAMUSCULAR
  Filled 2024-07-11: qty 1

## 2024-07-11 MED ORDER — SODIUM CHLORIDE 0.9% FLUSH: 10.0000 mL | Freq: Once | INTRAVENOUS | Status: DC | PRN

## 2024-07-11 MED ORDER — DIPHENHYDRAMINE HCL 50 MG/ML IJ SOLN
25.0000 mg | Freq: Once | INTRAMUSCULAR | Status: AC
  Administered 2024-07-11: 10:00:00 25 mg via INTRAVENOUS
  Filled 2024-07-11: qty 1

## 2024-07-11 MED ORDER — FAMOTIDINE IN NACL 20-0.9 MG/50ML-% IV SOLN
20.0000 mg | Freq: Once | INTRAVENOUS | Status: AC
  Administered 2024-07-11: 14:00:00 20 mg via INTRAVENOUS

## 2024-07-11 MED ORDER — IRON SUCROSE 300 MG IVPB - SIMPLE MED
300.0000 mg | Freq: Once | Status: AC
  Administered 2024-07-11: 11:00:00 300 mg via INTRAVENOUS
  Filled 2024-07-11: qty 300

## 2024-07-11 MED ORDER — FAMOTIDINE IN NACL 20-0.9 MG/50ML-% IV SOLN: 20.0000 mg | Freq: Once | INTRAVENOUS | Status: DC | PRN

## 2024-07-11 NOTE — Progress Notes (Signed)
 Hypersensitivity Reaction note  Date of event: 07/11/2024 Time of event: 1400 Name of drug involved: Venofer  Name of provider notified of the hypersensitivity reaction: Sarah Covington Was agent that likely caused hypersensitivity reaction added to Allergies List within EMR? Yes Chain of events including reaction signs/symptoms, treatment administered, and outcome (e.g., drug resumed; drug discontinued; sent to Emergency Department; etc.) Swelling and pain to L hand. Lauraine Dais notified. Pepcid  and Solu-medrol  given from emergency kit. Fluids given and patient monitored.   Shelley HARLENE BRAVO, RN 07/11/2024 3:29 PM

## 2024-07-11 NOTE — Patient Instructions (Signed)

## 2024-07-11 NOTE — Progress Notes (Addendum)
 Hematology and Oncology Follow Up Visit  Shelley Ramirez [id number] September 23, 1980 43 y.o. 07/11/2024   Principle Diagnosis:  Iron  deficiency anemia secondary to heavy cycles  Current Therapy:   IV iron  as indicated - Venofer - last dose 02/08/2023- IV Benadryl  prior    Interim History:  Shelley Ramirez is here today for follow-up.   Menstrual cycles have been heavy.   She is symptomatic with fatigue, dizziness, mild SOB with exertion and palpitations.  No other blood loss noted. No abnormal bruising, no petechiae.  No fever, chills, n/v, cough, rash, chest pain, abdominal pain or changes in bowel or bladder habits.  No swelling, tenderness, numbness or tingling in her extremities at this time.  No falls or syncope.  Appetite and hydration are good Wt Readings from Last 3 Encounters:  07/11/24 144 lb (65.3 kg)  07/05/24 146 lb (66.2 kg)  05/30/24 139 lb (63 kg)     ECOG Performance Status: 1 - Symptomatic but completely ambulatory  Medications:  Allergies as of 07/11/2024       Reactions   Codeine Shortness Of Breath, Other (See Comments)   Hyperventilation, pass out, loss off consciousness     Clindamycin  Nausea And Vomiting   Penicillin G Rash        Medication List        Accurate as of July 11, 2024 10:01 AM. If you have any questions, ask your nurse or doctor.          amitriptyline  10 MG tablet Commonly known as: ELAVIL  Take 1 tablet (10 mg total) by mouth at bedtime.   Clindamycin -Benzoyl Per (Refr) gel Apply to face every other morning   D3-50 1.25 MG (50000 UT) capsule Generic drug: Cholecalciferol  Take 1 capsule (1.25 mg total) by mouth every 7 (seven) days.   EPINEPHrine  0.3 mg/0.3 mL Soaj injection Commonly known as: EpiPen  2-Pak Inject 0.3 mg into the muscle as needed for anaphylaxis. Use in event of severe allergic reaction (throat closing, unable to breath, about to pass out or faint).   ondansetron  4 MG disintegrating  tablet Commonly known as: ZOFRAN -ODT Take 1 tablet (4 mg total) by mouth every 8 (eight) hours as needed for nausea or vomiting.   SUMAtriptan  50 MG tablet Commonly known as: IMITREX  Take 1 tablet (50 mg total) by mouth daily. May repeat in 2 hours if headache persists or recurs.   tirzepatide  10 MG/0.5ML Pen Commonly known as: MOUNJARO  Inject 10 mg into the skin once a week.   traZODone  50 MG tablet Commonly known as: DESYREL  Take 0.5-1 tablets (25-50 mg total) by mouth at bedtime as needed for sleep.   tretinoin  0.025 % cream Commonly known as: RETIN-A  Apply 1/2 dime sized amount on cheeks and chin nightly to tolerance.        Allergies:  Allergies  Allergen Reactions   Codeine Shortness Of Breath and Other (See Comments)    Hyperventilation, pass out, loss off consciousness     Clindamycin  Nausea And Vomiting   Penicillin G Rash    Past Medical History, Surgical history, Social history, and Family History were reviewed and updated.  Review of Systems: All other 10 point review of systems is negative.   Physical Exam:  height is 5' (1.524 m) and weight is 144 lb (65.3 kg). Her oral temperature is 98.2 F (36.8 C). Her blood pressure is 107/75 and her pulse is 88. Her respiration is 17 and oxygen saturation is 100%.   Wt Readings from Last 3  Encounters:  07/11/24 144 lb (65.3 kg)  07/05/24 146 lb (66.2 kg)  05/30/24 139 lb (63 kg)    Ocular: Sclerae unicteric, pupils equal, round and reactive to light Ear-nose-throat: Oropharynx clear, dentition fair Lymphatic: No cervical or supraclavicular adenopathy Lungs no rales or rhonchi, good excursion bilaterally Heart regular rate and rhythm, no murmur appreciated Abd soft, nontender, positive bowel sounds MSK no focal spinal tenderness, no joint edema Neuro: non-focal, well-oriented, appropriate affect   Lab Results  Component Value Date   WBC 3.3 (L) 07/05/2024   HGB 8.8 (L) 07/05/2024   HCT 30.8 (L)  07/05/2024   MCV 78.4 (L) 07/05/2024   PLT 519 (H) 07/05/2024   Lab Results  Component Value Date   FERRITIN 4 (L) 07/05/2024   IRON  19 (L) 07/05/2024   TIBC 471 (H) 07/05/2024   UIBC 259 08/23/2023   IRONPCTSAT 4 (L) 07/05/2024   Lab Results  Component Value Date   RETICCTPCT 1.0 08/23/2023   RBC 3.93 07/05/2024   No results found for: KPAFRELGTCHN, LAMBDASER, KAPLAMBRATIO No results found for: IGGSERUM, IGA, IGMSERUM No results found for: STEPHANY CARLOTA BENSON MARKEL EARLA JOANNIE DOC VICK, SPEI   Chemistry      Component Value Date/Time   NA 141 08/23/2023 0744   K 4.2 08/23/2023 0744   CL 105 08/23/2023 0744   CO2 22 08/23/2023 0744   BUN 5 (L) 08/23/2023 0744   CREATININE 0.85 08/23/2023 0744      Component Value Date/Time   CALCIUM 9.1 08/23/2023 0744   ALKPHOS 46 08/23/2023 0744   AST 16 08/23/2023 0744   ALT 10 08/23/2023 0744   BILITOT 0.6 08/23/2023 0744      Encounter Diagnoses  Name Primary?   Iron  deficiency anemia due to chronic blood loss Yes   B12 deficiency    Impression and Plan: [name] is a pleasant 43 yo African American female with long history of iron  deficiency anemia with heavy cycle.   Labs from 07/05/2024 show iron  saturation 4%, ferritin 4 Hgb 8.8.  B12 304  Iron  studies pending. Will replace as needed  RTC weekly x 3 for B12 injections then monthly x 12 total RTC weekly x 2 for IV Venofer  with premeds RTC 2 months APP, labs ( Cbc w/, CMP, iron , ferritin, folate, B12)-[facility]  UPDATE: Immediately following her infusion she started to experience the bilateral hand swelling that she had previously. Rated 5/10 in nature. No other symptoms including SOB, tongue or facial swelling, GI upset or rash. She was premedicated with IV Benadryl . IV saline was administered, she was assessed by myself and given 125 mg of solumedrol along with 20 mg of IV pepcid . These medications have been added to her  treatment plan. Should she react again despite receiving premedications I would suggest that we switch her to a different type of IV iron  such as Feraheme or Monoferric. Symptoms resolved over the course of 30 minutes and no additional symptoms were reported. She was discharged home with red flag symptoms advisement and instructions to call 911 if needed.   Lauraine CHRISTELLA Dais, PA-C 12/18/202510:01 AM

## 2024-07-11 NOTE — Addendum Note (Signed)
 Addended by: TONETTE DOMINO on: 07/11/2024 02:24 PM   Modules accepted: Orders

## 2024-07-17 ENCOUNTER — Other Ambulatory Visit (HOSPITAL_BASED_OUTPATIENT_CLINIC_OR_DEPARTMENT_OTHER): Payer: Self-pay

## 2024-07-17 ENCOUNTER — Inpatient Hospital Stay: Payer: Self-pay

## 2024-07-17 ENCOUNTER — Emergency Department (HOSPITAL_BASED_OUTPATIENT_CLINIC_OR_DEPARTMENT_OTHER)

## 2024-07-17 ENCOUNTER — Emergency Department (HOSPITAL_BASED_OUTPATIENT_CLINIC_OR_DEPARTMENT_OTHER)
Admission: EM | Admit: 2024-07-17 | Discharge: 2024-07-18 | Disposition: A | Discharge status: Home / Self Care | Payer: Self-pay | Attending: Emergency Medicine | Admitting: Emergency Medicine

## 2024-07-17 ENCOUNTER — Encounter (HOSPITAL_BASED_OUTPATIENT_CLINIC_OR_DEPARTMENT_OTHER): Payer: Self-pay

## 2024-07-17 ENCOUNTER — Other Ambulatory Visit: Payer: Self-pay

## 2024-07-17 VITALS — BP 97/73 | HR 83 | Temp 97.8°F | Resp 18

## 2024-07-17 DIAGNOSIS — D508 Other iron deficiency anemias: Secondary | ICD-10-CM

## 2024-07-17 DIAGNOSIS — T8090XA Unspecified complication following infusion and therapeutic injection, initial encounter: Secondary | ICD-10-CM

## 2024-07-17 DIAGNOSIS — I959 Hypotension, unspecified: Secondary | ICD-10-CM | POA: Diagnosis present

## 2024-07-17 DIAGNOSIS — R109 Unspecified abdominal pain: Secondary | ICD-10-CM | POA: Insufficient documentation

## 2024-07-17 DIAGNOSIS — R7309 Other abnormal glucose: Secondary | ICD-10-CM | POA: Insufficient documentation

## 2024-07-17 DIAGNOSIS — R1110 Vomiting, unspecified: Secondary | ICD-10-CM | POA: Insufficient documentation

## 2024-07-17 DIAGNOSIS — T454X5A Adverse effect of iron and its compounds, initial encounter: Secondary | ICD-10-CM | POA: Insufficient documentation

## 2024-07-17 DIAGNOSIS — R1111 Vomiting without nausea: Secondary | ICD-10-CM

## 2024-07-17 LAB — URINALYSIS, ROUTINE W REFLEX MICROSCOPIC
Bilirubin Urine: NEGATIVE
Glucose, UA: NEGATIVE mg/dL
Ketones, ur: NEGATIVE mg/dL
Nitrite: NEGATIVE
Protein, ur: 30 mg/dL — AB
Specific Gravity, Urine: 1.015 (ref 1.005–1.030)
pH: 7 (ref 5.0–8.0)

## 2024-07-17 LAB — URINALYSIS, MICROSCOPIC (REFLEX): RBC / HPF: >50 RBC/hpf (ref 0–5)

## 2024-07-17 LAB — CBG MONITORING, ED: Glucose-Capillary: 147 mg/dL — ABNORMAL HIGH (ref 70–99)

## 2024-07-17 LAB — COMPREHENSIVE METABOLIC PANEL WITH GFR
ALT: 11 U/L (ref 0–44)
AST: 22 U/L (ref 15–41)
Albumin: 4 g/dL (ref 3.5–5.0)
Alkaline Phosphatase: 40 U/L (ref 38–126)
Anion gap: 16 — ABNORMAL HIGH (ref 5–15)
BUN: 9 mg/dL (ref 6–20)
CO2: 17 mmol/L — ABNORMAL LOW (ref 22–32)
Calcium: 8.9 mg/dL (ref 8.9–10.3)
Chloride: 106 mmol/L (ref 98–111)
Creatinine, Ser: 0.93 mg/dL (ref 0.44–1.00)
GFR, Estimated: >60 mL/min
Glucose, Bld: 151 mg/dL — ABNORMAL HIGH (ref 70–99)
Potassium: 3.5 mmol/L (ref 3.5–5.1)
Sodium: 139 mmol/L (ref 135–145)
Total Bilirubin: 0.5 mg/dL (ref 0.0–1.2)
Total Protein: 6.6 g/dL (ref 6.5–8.1)

## 2024-07-17 LAB — CBC WITH DIFFERENTIAL/PLATELET
Abs Immature Granulocytes: 0.02 K/uL (ref 0.00–0.07)
Basophils Absolute: 0 K/uL (ref 0.0–0.1)
Basophils Relative: 0 %
Eosinophils Absolute: 0 K/uL (ref 0.0–0.5)
Eosinophils Relative: 0 %
HCT: 36.2 % (ref 36.0–46.0)
Hemoglobin: 11.4 g/dL — ABNORMAL LOW (ref 12.0–15.0)
Immature Granulocytes: 0 %
Lymphocytes Relative: 23 %
Lymphs Abs: 1.6 K/uL (ref 0.7–4.0)
MCH: 23.2 pg — ABNORMAL LOW (ref 26.0–34.0)
MCHC: 31.5 g/dL (ref 30.0–36.0)
MCV: 73.7 fL — ABNORMAL LOW (ref 80.0–100.0)
Monocytes Absolute: 0.2 K/uL (ref 0.1–1.0)
Monocytes Relative: 3 %
Neutro Abs: 5.1 K/uL (ref 1.7–7.7)
Neutrophils Relative %: 74 %
Platelets: 580 K/uL — ABNORMAL HIGH (ref 150–400)
RBC: 4.91 MIL/uL (ref 3.87–5.11)
RDW: 18.8 % — ABNORMAL HIGH (ref 11.5–15.5)
WBC: 6.9 K/uL (ref 4.0–10.5)
nRBC: 0 % (ref 0.0–0.2)

## 2024-07-17 LAB — TROPONIN T, HIGH SENSITIVITY: Troponin T High Sensitivity: <15 ng/L (ref 0–19)

## 2024-07-17 LAB — HCG, SERUM, QUALITATIVE: Preg, Serum: NEGATIVE

## 2024-07-17 LAB — LACTIC ACID, PLASMA
Lactic Acid, Venous: 2.2 mmol/L (ref 0.5–1.9)
Lactic Acid, Venous: 1.6 mmol/L (ref 0.5–1.9)

## 2024-07-17 MED ORDER — IRON SUCROSE 300 MG IVPB - SIMPLE MED
300.0000 mg | Freq: Once | Status: AC
  Administered 2024-07-17: 300 mg via INTRAVENOUS
  Filled 2024-07-17: qty 300

## 2024-07-17 MED ORDER — CYANOCOBALAMIN 1000 MCG/ML IJ SOLN
1000.0000 ug | Freq: Once | INTRAMUSCULAR | Status: AC
  Administered 2024-07-17: 1000 ug via INTRAMUSCULAR
  Filled 2024-07-17: qty 1

## 2024-07-17 MED ORDER — METHYLPREDNISOLONE SODIUM SUCC 125 MG IJ SOLR
125.0000 mg | Freq: Every day | INTRAMUSCULAR | Status: DC
  Administered 2024-07-17: 125 mg via INTRAVENOUS
  Filled 2024-07-17: qty 2

## 2024-07-17 MED ORDER — DIPHENHYDRAMINE HCL 50 MG/ML IJ SOLN
25.0000 mg | Freq: Once | INTRAMUSCULAR | Status: AC
  Administered 2024-07-17: 25 mg via INTRAVENOUS
  Filled 2024-07-17: qty 1

## 2024-07-17 MED ORDER — SODIUM CHLORIDE 0.9 % IV SOLN: Freq: Once | INTRAVENOUS | Status: AC

## 2024-07-17 MED ORDER — FAMOTIDINE IN NACL 20-0.9 MG/50ML-% IV SOLN
20.0000 mg | Freq: Once | INTRAVENOUS | Status: AC
  Administered 2024-07-17: 20 mg via INTRAVENOUS
  Filled 2024-07-17: qty 50

## 2024-07-17 MED ORDER — IOHEXOL 350 MG/ML SOLN
100.0000 mL | Freq: Once | INTRAVENOUS | Status: AC | PRN
  Administered 2024-07-17: 100 mL via INTRAVENOUS

## 2024-07-17 MED ORDER — MIDODRINE HCL 5 MG PO TABS
10.0000 mg | ORAL_TABLET | Freq: Three times a day (TID) | ORAL | Status: DC
  Filled 2024-07-17: qty 2

## 2024-07-17 MED ORDER — LACTATED RINGERS IV BOLUS
1000.0000 mL | Freq: Once | INTRAVENOUS | Status: AC
  Administered 2024-07-17: 1000 mL via INTRAVENOUS

## 2024-07-17 MED ORDER — ONDANSETRON HCL 4 MG/2ML IJ SOLN
4.0000 mg | Freq: Once | INTRAMUSCULAR | Status: AC
  Administered 2024-07-17: 4 mg via INTRAVENOUS
  Filled 2024-07-17: qty 2

## 2024-07-17 MED ORDER — SODIUM CHLORIDE 0.9 % IV BOLUS
1000.0000 mL | Freq: Once | INTRAVENOUS | Status: AC
  Administered 2024-07-17: 1000 mL via INTRAVENOUS

## 2024-07-17 MED ORDER — EPINEPHRINE 0.3 MG/0.3ML IJ SOAJ
0.3000 mg | Freq: Once | INTRAMUSCULAR | Status: AC
  Administered 2024-07-17: 0.3 mg via INTRAMUSCULAR
  Filled 2024-07-17: qty 0.3

## 2024-07-17 MED ORDER — HYDROMORPHONE HCL 1 MG/ML IJ SOLN
1.0000 mg | Freq: Once | INTRAMUSCULAR | Status: AC
  Administered 2024-07-17: 1 mg via INTRAVENOUS
  Filled 2024-07-17: qty 1

## 2024-07-17 NOTE — ED Provider Notes (Signed)
 " [facility] Provider Note   CSN: [id number] Arrival date & time: 07/17/24  1315     Patient presents with: Abdominal Pain   Shelley Ramirez is a 43 y.o. female.   Patient here with severe abdominal pain after iron  infusion.  She typically gets some swelling in her hands following iron  transfusions but never had anything like this.  She is on her menstrual cycle.  She states that she felt well before this but then got extreme pain in her abdomen and very uncomfortable shortly after but no difficulty speaking eating or drinking.  She had a bowel movement just prior to my evaluation.  She denies any chest pain or shortness of breath.  Pain seems to be pretty localized to her abdomen.  She has history of acid reflux shortness of breath.  This pain started shortly after she left the infusion center.  The history is provided by the patient.       Prior to Admission medications  Medication Sig Start Date End Date Taking? Authorizing Provider  amitriptyline  (ELAVIL ) 10 MG tablet Take 1 tablet (10 mg total) by mouth at bedtime. 07/05/24   O'Sullivan, Melissa, NP  Cholecalciferol  (VITAMIN D3) 1.25 MG (50000 UT) CAPS Take 1 capsule (1.25 mg total) by mouth every 7 (seven) days. 09/20/23   Delores Clayborne LABOR, DO  Clindamycin -Benzoyl Per, Refr, gel Apply to face every other morning 11/30/22     EPINEPHrine  (EPIPEN  2-PAK) 0.3 mg/0.3 mL IJ SOAJ injection Inject 0.3 mg into the muscle as needed for anaphylaxis. Use in event of severe allergic reaction (throat closing, unable to breath, about to pass out or faint). 03/15/22   Delores Clayborne A, DO  ondansetron  (ZOFRAN -ODT) 4 MG disintegrating tablet Take 1 tablet (4 mg total) by mouth every 8 (eight) hours as needed for nausea or vomiting. 03/21/24   Delores Clayborne A, DO  SUMAtriptan  (IMITREX ) 50 MG tablet Take 1 tablet (50 mg total) by mouth daily. May repeat in 2 hours if headache persists or recurs. 07/05/24 08/04/24   O'Sullivan, Melissa, NP  tirzepatide  (MOUNJARO ) 10 MG/0.5ML Pen Inject 10 mg into the skin once a week. 07/01/24   Delores Clayborne A, DO  traZODone  (DESYREL ) 50 MG tablet Take 0.5-1 tablets (25-50 mg total) by mouth at bedtime as needed for sleep. 03/22/23   Daryl Setter, NP  tretinoin  (RETIN-A ) 0.025 % cream Apply 1/2 dime sized amount on cheeks and chin nightly to tolerance. 11/30/22       Allergies: Codeine, Clindamycin , and Penicillin g    Review of Systems  Updated Vital Signs BP 95/61   Pulse (!) 56   Temp 98.3 F (36.8 C) (Oral)   Resp 11   Wt 66.2 kg   LMP 07/16/2024 (Exact Date)   SpO2 100%   BMI 28.50 kg/m   Physical Exam Vitals and nursing note reviewed.  Constitutional:      General: She is in acute distress.     Appearance: She is well-developed. She is ill-appearing.  HENT:     Head: Normocephalic and atraumatic.     Comments: No swelling of lips or tongue Eyes:     Extraocular Movements: Extraocular movements intact.     Conjunctiva/sclera: Conjunctivae normal.     Pupils: Pupils are equal, round, and reactive to light.  Cardiovascular:     Rate and Rhythm: Normal rate and regular rhythm.     Heart sounds: No murmur heard. Pulmonary:  Effort: Pulmonary effort is normal. No respiratory distress.     Breath sounds: Normal breath sounds.  Abdominal:     Palpations: Abdomen is soft.     Tenderness: There is abdominal tenderness.  Musculoskeletal:        General: No swelling.     Cervical back: Neck supple.  Skin:    General: Skin is warm and dry.     Capillary Refill: Capillary refill takes less than 2 seconds.  Neurological:     Mental Status: She is alert.  Psychiatric:        Mood and Affect: Mood normal.     (all labs ordered are listed, but only abnormal results are displayed) Labs Reviewed  CBC WITH DIFFERENTIAL/PLATELET - Abnormal; Notable for the following components:      Result Value   Hemoglobin 11.4 (*)    MCV 73.7 (*)    MCH  23.2 (*)    RDW 18.8 (*)    Platelets 580 (*)    All other components within normal limits  COMPREHENSIVE METABOLIC PANEL WITH GFR - Abnormal; Notable for the following components:   CO2 17 (*)    Glucose, Bld 151 (*)    Anion gap 16 (*)    All other components within normal limits  CBG MONITORING, ED - Abnormal; Notable for the following components:   Glucose-Capillary 147 (*)    All other components within normal limits  HCG, SERUM, QUALITATIVE    EKG: None  Radiology: CT Angio Chest/Abd/Pel for Dissection W and/or Wo Contrast Result Date: 07/17/2024 EXAM: CTA CHEST, ABDOMEN AND PELVIS WITHOUT AND WITH CONTRAST 07/17/2024 02:14:39 PM TECHNIQUE: CTA of the chest was performed without and with the administration of intravenous contrast. CTA of the abdomen and pelvis was performed without and with the administration of intravenous contrast. 100 mL of iohexol  (OMNIPAQUE ) 350 MG/ML injection was administered. Multiplanar reformatted images are provided for review. MIP images are provided for review. Automated exposure control, iterative reconstruction, and/or weight based adjustment of the mA/kV was utilized to reduce the radiation dose to as low as reasonably achievable. COMPARISON: CT abdomen and pelvis dated 10/17/2005. CLINICAL HISTORY: Acute aortic syndrome (AAS) suspected. FINDINGS: VASCULATURE: AORTA: No acute finding. No abdominal aortic aneurysm. No aortic dissection or intramural hematoma identified. PULMONARY ARTERIES: No pulmonary embolism with the limits of this exam. GREAT VESSELS OF AORTIC ARCH: No acute finding. No dissection. No arterial occlusion or significant stenosis. CELIAC TRUNK: No acute finding. No occlusion or significant stenosis. SUPERIOR MESENTERIC ARTERY: No acute finding. No occlusion or significant stenosis. INFERIOR MESENTERIC ARTERY: No acute finding. No occlusion or significant stenosis. RENAL ARTERIES: No acute finding. No occlusion or significant stenosis. ILIAC  ARTERIES: No acute finding. No occlusion or significant stenosis. CHEST: MEDIASTINUM: No mediastinal lymphadenopathy. The heart and pericardium demonstrate no acute abnormality. LUNGS AND PLEURA: The lungs are without acute process. No focal consolidation or pulmonary edema. No evidence of pleural effusion or pneumothorax. THORACIC BONES AND SOFT TISSUES: No acute bone or soft tissue abnormality. ABDOMEN AND PELVIS: LIVER: The liver is unremarkable. GALLBLADDER AND BILE DUCTS: Gallbladder is unremarkable. No biliary ductal dilatation. SPLEEN: The spleen is unremarkable. PANCREAS: The pancreas is unremarkable. ADRENAL GLANDS: Bilateral adrenal glands demonstrate no acute abnormality. KIDNEYS, URETERS AND BLADDER: No stones in the kidneys or ureters. No hydronephrosis. No perinephric or periureteral stranding. Urinary bladder is unremarkable. GI AND BOWEL: Stomach and duodenal sweep demonstrate no acute abnormality. There is no bowel obstruction. No abnormal bowel wall thickening or  distension. REPRODUCTIVE: Reproductive organs are unremarkable. PERITONEUM AND RETROPERITONEUM: No ascites or free air. LYMPH NODES: No lymphadenopathy. ABDOMINAL BONES AND SOFT TISSUES: No acute abnormality of the bones. No acute soft tissue abnormality. IMPRESSION: 1. No acute aortic syndrome identified. Electronically signed by: Michaeline Blanch MD 07/17/2024 02:35 PM EST RP Workstation: [id number]     Procedures   Medications Ordered in the ED  sodium chloride  0.9 % bolus 1,000 mL (has no administration in time range)  HYDROmorphone  (DILAUDID ) injection 1 mg (1 mg Intravenous Given 07/17/24 1334)  sodium chloride  0.9 % bolus 1,000 mL (1,000 mLs Intravenous New Bag/Given 07/17/24 1334)  iohexol  (OMNIPAQUE ) 350 MG/ML injection 100 mL (100 mLs Intravenous Contrast Given 07/17/24 1405)  ondansetron  (ZOFRAN ) injection 4 mg (4 mg Intravenous Given 07/17/24 1439)                                    Medical Decision Making Amount  and/or Complexity of Data Reviewed Labs: ordered. Radiology: ordered.  Risk Prescription drug management.   Doninique Yarden Manuelito is here with severe abdominal pain after iron  infusion this morning.  Patient with normal vitals.  No fever.  She is in severe discomfort.  This happened shortly after her infusion while she was driving home.  She has had some mild reactions in the past to this mostly with hand swelling nothing like this.  She felt while going into the infusion felt well during the infusion.  She is not having any difficulty with breathing or lip or tongue swelling.  She did have a bowel movement just for coming here 2.  She denies any alcohol or drug use.  She has history of acid reflux and anemia.  Overall she denies any chest pain shortness of breath.  But she is very uncomfortable will get dissection study.  Blood pressures in the 90s.  Will give her a dose of IV Dilaudid  and give IV fluids.  Differential diagnosis likely reaction to the iron  transfusion but will look for any sort of dissection abdominal perforation or any other acute intra-abdominal thoracic process.  Has not had any fevers or chills otherwise.  Seems less likely to be infectious process.  Symptoms have been pretty suddenly.  Lab work shows no significant leukocytosis anemia or electrolyte abnormality.  Pregnancy test negative.  No AKI.  Dissection studies unremarkable.  No acute findings in the CT of chest abdomen and pelvis.  Patient is improving but still somewhat nauseous.  Will give further antiemetics IV fluids and reevaluate.  Handed off to oncoming ED staff with patient pending reevaluation.  I do suspect her symptoms are secondary to iron  infusion.  She does not admit to any marijuana use or gastroparesis history.  Anticipate discharge to home if we can get her symptomatically feeling better.    This chart was dictated using voice recognition software.  Despite best efforts to proofread,  errors can occur which can  change the documentation meaning.      Final diagnoses:  Abdominal pain, unspecified abdominal location  Vomiting without nausea, unspecified vomiting type  Infusion reaction, initial encounter    ED Discharge Orders     None          Ruthe Cornet, DO 07/17/24 1502  "

## 2024-07-17 NOTE — ED Notes (Signed)
 Pt reports need to have BM, placed on bedpan d/t pts inability to TF to bed without assistance

## 2024-07-17 NOTE — ED Provider Notes (Signed)
" °  Physical Exam  BP 95/61   Pulse (!) 56   Temp 98.3 F (36.8 C) (Oral)   Resp 11   Wt 66.2 kg   LMP 07/16/2024 (Exact Date)   SpO2 100%   BMI 28.50 kg/m   Physical Exam  Procedures  Procedures  ED Course / MDM   Clinical Course as of 07/18/24 0205  Wed Jul 17, 2024  2351 CT Angio Chest/Abd/Pel for Dissection W and/or Wo Contrast [HD]    Clinical Course User Index [HD] Cleatus Delayne GAILS, MD   Medical Decision Making Amount and/or Complexity of Data Reviewed Labs: ordered. Radiology: ordered.  Risk Prescription drug management. Decision regarding hospitalization.    Received care of patient from previous provider. Please see his note for prior history, physical and exam. Presented with severe abdominal pain, body aches after iron  infusion. Symptoms started about 15 minutes after completing infusion. Also had mounjaro  today-same dose she has been on, however has not had it at the same time. Has not been eating and drinking today. Labs and dissection study without acute abnormalities. Initially blood pressure improving, however on my reevaluation is in 80s again.  Given IM epi for possible allergic reaction without significant improvement.  Given IV fluids, food.  Blood pressures continue to be low.  Overall low suspicion for sepsis, GI bleed, PE, myocarditis, ACS, and no signs of intrathroacic or abdominal infection, tamponade or dissection on ct.    Suspect likely side effect of iron  infusion.  Has maintained MAPs above 65, however continues to have blood pressures lower than baseline.  TSH/free t4 pending. Low clinical suspicion for adrenal insufficiency. In discussion with hospitalist will order rocephin  to cover for UTI.  Will admit for further care. Is starting to feel symptomatically improved and if BP improve feel she may be discharged.     Dreama Longs, MD 07/18/24 1045  "

## 2024-07-17 NOTE — ED Triage Notes (Signed)
 Pt in extreme pain and difficult to cooperate. History from pt's best friend who is at bedside. Pt had iron  infusion this morning 0800 that finished at 1115-1130. Pt started having severe pain 5-10 minutes ago.

## 2024-07-17 NOTE — ED Notes (Signed)
 Pt endorses mild nausea after contrast for CT. Pt requested and was denied sprite to drink. Pt aox4

## 2024-07-17 NOTE — Patient Instructions (Addendum)
 Iron Sucrose Injection What is this medication? IRON SUCROSE (EYE ern SOO krose) treats low levels of iron (iron deficiency anemia) in people with kidney disease. Iron is a mineral that plays an important role in making red blood cells, which carry oxygen from your lungs to the rest of your body. This medicine may be used for other purposes; ask your health care provider or pharmacist if you have questions. COMMON BRAND NAME(S): Venofer What should I tell my care team before I take this medication? They need to know if you have any of these conditions: Anemia not caused by low iron levels Heart disease High levels of iron in the blood Kidney disease Liver disease An unusual or allergic reaction to iron, other medications, foods, dyes, or preservatives Pregnant or trying to get pregnant Breastfeeding How should I use this medication? This medication is infused into a vein. It is given by your care team in a hospital or clinic setting. Talk to your care team about the use of this medication in children. While it may be prescribed for children as young as 2 years for selected conditions, precautions do apply. Overdosage: If you think you have taken too much of this medicine contact a poison control center or emergency room at once. NOTE: This medicine is only for you. Do not share this medicine with others. What if I miss a dose? Keep appointments for follow-up doses. It is important not to miss your dose. Call your care team if you are unable to keep an appointment. What may interact with this medication? Do not take this medication with any of the following: Deferoxamine Dimercaprol Other iron products This medication may also interact with the following: Chloramphenicol Deferasirox This list may not describe all possible interactions. Give your health care provider a list of all the medicines, herbs, non-prescription drugs, or dietary supplements you use. Also tell them if you smoke,  drink alcohol, or use illegal drugs. Some items may interact with your medicine. What should I watch for while using this medication? Visit your care team for regular checks on your progress. Tell your care team if your symptoms do not start to get better or if they get worse. You may need blood work done while you are taking this medication. You may need to eat more foods that contain iron. Talk to your care team. Foods that contain iron include whole grains or cereals, dried fruits, beans, peas, leafy green vegetables, and organ meats (liver, kidney). What side effects may I notice from receiving this medication? Side effects that you should report to your care team as soon as possible: Allergic reactions--skin rash, itching, hives, swelling of the face, lips, tongue, or throat Low blood pressure--dizziness, feeling faint or lightheaded, blurry vision Shortness of breath Side effects that usually do not require medical attention (report to your care team if they continue or are bothersome): Flushing Headache Joint pain Muscle pain Nausea Pain, redness, or irritation at injection site This list may not describe all possible side effects. Call your doctor for medical advice about side effects. You may report side effects to FDA at 1-800-FDA-1088. Where should I keep my medication? This medication is given in a hospital or clinic. It will not be stored at home. NOTE: This sheet is a summary. It may not cover all possible information. If you have questions about this medicine, talk to your doctor, pharmacist, or health care provider.  2024 Elsevier/Gold Standard (2023-03-01 00:00:00)Vitamin B12 Injection What is this medication? Vitamin B12 (  VAHY tuh min B12) prevents and treats low vitamin B12 levels in your body. It is used in people who do not get enough vitamin B12 from their diet or when their digestive tract does not absorb enough. Vitamin B12 plays an important role in maintaining the  health of your nervous system and red blood cells. This medicine may be used for other purposes; ask your health care provider or pharmacist if you have questions. COMMON BRAND NAME(S): B-12 Compliance Kit, B-12 Injection Kit, Cyomin, Dodex, LA-12, Nutri-Twelve, Physicians EZ Use B-12, Primabalt, Vitamin Deficiency Injectable System - B12 What should I tell my care team before I take this medication? They need to know if you have any of these conditions: Kidney disease Leber's disease Megaloblastic anemia An unusual or allergic reaction to cyanocobalamin, cobalt, other medications, foods, dyes, or preservatives Pregnant or trying to get pregnant Breast-feeding How should I use this medication? This medication is injected into a muscle or deeply under the skin. It is usually given in a clinic or care team's office. However, your care team may teach you how to inject yourself. Follow all instructions. Talk to your care team about the use of this medication in children. Special care may be needed. Overdosage: If you think you have taken too much of this medicine contact a poison control center or emergency room at once. NOTE: This medicine is only for you. Do not share this medicine with others. What if I miss a dose? If you are given your dose at a clinic or care team's office, call to reschedule your appointment. If you give your own injections, and you miss a dose, take it as soon as you can. If it is almost time for your next dose, take only that dose. Do not take double or extra doses. What may interact with this medication? Alcohol Colchicine This list may not describe all possible interactions. Give your health care provider a list of all the medicines, herbs, non-prescription drugs, or dietary supplements you use. Also tell them if you smoke, drink alcohol, or use illegal drugs. Some items may interact with your medicine. What should I watch for while using this medication? Visit your  care team regularly. You may need blood work done while you are taking this medication. You may need to follow a special diet. Talk to your care team. Limit your alcohol intake and avoid smoking to get the best benefit. What side effects may I notice from receiving this medication? Side effects that you should report to your care team as soon as possible: Allergic reactions--skin rash, itching, hives, swelling of the face, lips, tongue, or throat Swelling of the ankles, hands, or feet Trouble breathing Side effects that usually do not require medical attention (report to your care team if they continue or are bothersome): Diarrhea This list may not describe all possible side effects. Call your doctor for medical advice about side effects. You may report side effects to FDA at 1-800-FDA-1088. Where should I keep my medication? Keep out of the reach of children. Store at room temperature between 15 and 30 degrees C (59 and 85 degrees F). Protect from light. Throw away any unused medication after the expiration date. NOTE: This sheet is a summary. It may not cover all possible information. If you have questions about this medicine, talk to your doctor, pharmacist, or health care provider.  2024 Elsevier/Gold Standard (2021-03-23 00:00:00)

## 2024-07-17 NOTE — Progress Notes (Signed)
 Shelley Ramirez

## 2024-07-17 NOTE — ED Notes (Signed)
 Pts endorses that she and friend went to nearby restaurant to get a sprite, but I didn't drink anything

## 2024-07-18 DIAGNOSIS — I959 Hypotension, unspecified: Secondary | ICD-10-CM | POA: Diagnosis present

## 2024-07-18 LAB — CBC WITH DIFFERENTIAL/PLATELET
Abs Immature Granulocytes: 0.03 K/uL (ref 0.00–0.07)
Basophils Absolute: 0 K/uL (ref 0.0–0.1)
Basophils Relative: 0 %
Eosinophils Absolute: 0 K/uL (ref 0.0–0.5)
Eosinophils Relative: 0 %
HCT: 25.6 % — ABNORMAL LOW (ref 36.0–46.0)
Hemoglobin: 8.1 g/dL — ABNORMAL LOW (ref 12.0–15.0)
Immature Granulocytes: 0 %
Lymphocytes Relative: 11 %
Lymphs Abs: 1 K/uL (ref 0.7–4.0)
MCH: 23.6 pg — ABNORMAL LOW (ref 26.0–34.0)
MCHC: 31.6 g/dL (ref 30.0–36.0)
MCV: 74.6 fL — ABNORMAL LOW (ref 80.0–100.0)
Monocytes Absolute: 0.3 K/uL (ref 0.1–1.0)
Monocytes Relative: 4 %
Neutro Abs: 7.2 K/uL (ref 1.7–7.7)
Neutrophils Relative %: 85 %
Platelets: 405 K/uL — ABNORMAL HIGH (ref 150–400)
RBC: 3.43 MIL/uL — ABNORMAL LOW (ref 3.87–5.11)
RDW: 18.5 % — ABNORMAL HIGH (ref 11.5–15.5)
WBC: 8.5 K/uL (ref 4.0–10.5)
nRBC: 0 % (ref 0.0–0.2)

## 2024-07-18 LAB — T4, FREE: Free T4: 0.93 ng/dL (ref 0.80–2.00)

## 2024-07-18 LAB — TSH: TSH: 0.774 u[IU]/mL (ref 0.350–4.500)

## 2024-07-18 MED ORDER — SODIUM CHLORIDE 0.9 % IV SOLN
1.0000 g | Freq: Once | INTRAVENOUS | Status: AC
  Administered 2024-07-18: 1 g via INTRAVENOUS
  Filled 2024-07-18: qty 10

## 2024-07-18 MED ORDER — SODIUM CHLORIDE 0.9 % IV SOLN
INTRAVENOUS | Status: DC | PRN
  Administered 2024-07-18: 250 mL via INTRAVENOUS

## 2024-07-18 NOTE — ED Notes (Signed)
 [facility] called for transport, Talked to [employer]

## 2024-07-18 NOTE — ED Notes (Signed)
 Pt requesting to be DC. VSS. Pt ambulatory independently to bathroom and tolerated well. MD aware.

## 2024-07-18 NOTE — ED Provider Notes (Signed)
 Care assumed at 2300.  Patient status post iron  infusion earlier today, had severe abdominal pain, transient hypotension with nausea.  Did take a Mounjaro  shot yesterday.  Care assumed pending admission for ongoing hypotension.  On repeat evaluation patient is feeling well, she feels at her baseline.  Her hypotension has resolved.  Her blood pressures are in the upper 90s, which is her baseline.  She did have mild lactic acidosis, this did improve after IV fluid administration.  UA is not consistent with UTI-will send cultures and treat if positive.  She has already received 1 dose of antibiotics in the emergency department.  She does not have active urinary symptoms.  No recurrent vomiting in the department.  She is able to ambulate with a steady gait.  Suspect she had an infusion reaction secondary to iron , blood pressures have stabilized.  Her repeat H&H after IV fluids did downtrend but she is not having any active bleeding, suspect this is hemodilution.  Feel it is reasonable for patient to be discharged with outpatient follow-up as well as close return precautions.   Griselda Norris, MD 07/18/24 [phone]

## 2024-07-19 LAB — URINE CULTURE: Culture: 20000 — AB

## 2024-07-20 ENCOUNTER — Telehealth (HOSPITAL_BASED_OUTPATIENT_CLINIC_OR_DEPARTMENT_OTHER): Payer: Self-pay | Admitting: *Deleted

## 2024-07-20 NOTE — Telephone Encounter (Signed)
 Post ED Visit - Positive Culture Follow-up  Culture report reviewed by antimicrobial stewardship pharmacist: [facility] []  Rankin Dee, Pharm.D. []  Venetia Gully, Pharm.D., BCPS AQ-ID []  Garrel Crews, Pharm.D., BCPS []  Almarie Lunger, Pharm.D., BCPS []  [place], [address].D., BCPS, AAHIVP []  Rosaline Bihari, Pharm.D., BCPS, AAHIVP []  Vernell Meier, PharmD, BCPS []  Latanya Hint, PharmD, BCPS []  Donald Medley, PharmD, BCPS []  Rocky Bold, PharmD []  Dorothyann Alert, PharmD, BCPS [x]  Dorn Poot, PharmD  [facility] []  Rosaline Edison, PharmD []  Romona Bliss, PharmD []  Dolphus Roller, PharmD []  Veva Seip, Rph []  Vernell Daunt) Leonce, PharmD []  Eva Allis, PharmD []  Rosaline Millet, PharmD []  Iantha Batch, PharmD []  Arvin Gauss, PharmD []  Wanda Hasting, PharmD []  Ronal Rav, PharmD []  Rocky Slade, PharmD []  Bard Jeans, PharmD   Positive urine culture Do not treat per Lonni Sakai, MD Albino Alan Novak 07/20/2024, 11:05 AM

## 2024-07-22 ENCOUNTER — Telehealth: Payer: Self-pay

## 2024-07-22 DIAGNOSIS — D500 Iron deficiency anemia secondary to blood loss (chronic): Secondary | ICD-10-CM

## 2024-07-22 NOTE — Telephone Encounter (Signed)
 Received phone call from patient stating that she did not want to receive her iron  infusion tomorrow. She had a severe reaction and was almost admitted to the hospital after her last infusion. Pt states she does want to receive her Vitamin B12 injection and requesting to see a provider.   Pt appointments moved so that she could receive her injection and see provider same day. Pt agreeable to plan and appreciative of help. Pt verbalized understanding of appointment times. Lab orders placed.

## 2024-07-23 ENCOUNTER — Inpatient Hospital Stay: Payer: Self-pay

## 2024-07-23 LAB — CULTURE, BLOOD (ROUTINE X 2)
Culture: NO GROWTH
Culture: NO GROWTH
Special Requests: ADEQUATE
Special Requests: ADEQUATE

## 2024-07-26 ENCOUNTER — Inpatient Hospital Stay: Payer: Self-pay

## 2024-07-26 ENCOUNTER — Inpatient Hospital Stay: Payer: Self-pay | Admitting: Family

## 2024-07-26 ENCOUNTER — Inpatient Hospital Stay: Payer: Self-pay | Attending: Hematology & Oncology

## 2024-08-01 ENCOUNTER — Inpatient Hospital Stay: Payer: Self-pay

## 2024-08-02 ENCOUNTER — Encounter: Payer: Self-pay | Admitting: Family

## 2024-08-05 ENCOUNTER — Encounter: Payer: Self-pay | Admitting: Family

## 2024-08-08 ENCOUNTER — Other Ambulatory Visit (HOSPITAL_BASED_OUTPATIENT_CLINIC_OR_DEPARTMENT_OTHER): Payer: Self-pay

## 2024-08-08 ENCOUNTER — Encounter: Payer: Self-pay | Admitting: Bariatrics

## 2024-08-08 ENCOUNTER — Ambulatory Visit: Admitting: Bariatrics

## 2024-08-08 ENCOUNTER — Encounter: Payer: Self-pay | Admitting: Family

## 2024-08-08 VITALS — BP 114/71 | HR 77 | Ht 61.0 in | Wt 143.0 lb

## 2024-08-08 DIAGNOSIS — G4700 Insomnia, unspecified: Secondary | ICD-10-CM

## 2024-08-08 DIAGNOSIS — R632 Polyphagia: Secondary | ICD-10-CM

## 2024-08-08 DIAGNOSIS — Z6827 Body mass index (BMI) 27.0-27.9, adult: Secondary | ICD-10-CM | POA: Diagnosis not present

## 2024-08-08 DIAGNOSIS — E669 Obesity, unspecified: Secondary | ICD-10-CM | POA: Diagnosis not present

## 2024-08-08 MED ORDER — TIRZEPATIDE 10 MG/0.5ML ~~LOC~~ SOAJ
10.0000 mg | SUBCUTANEOUS | 0 refills | Status: AC
  Filled 2024-08-08 – 2024-08-19 (×4): qty 2, 28d supply, fill #0

## 2024-08-08 NOTE — Progress Notes (Signed)
 "                                                                                                             WEIGHT SUMMARY AND BIOMETRICS  Weight Lost Since Last Visit: 0  Weight Gained Since Last Visit: 4lb   Vitals BP: 114/71 Pulse Rate: 77 SpO2: 100 %   Anthropometric Measurements Height: 5' 1 (1.549 m) Weight: 143 lb (64.9 kg) BMI (Calculated): 27.03 Weight at Last Visit: 139lb Weight Lost Since Last Visit: 0 Weight Gained Since Last Visit: 4lb Starting Weight: 205lb Total Weight Loss (lbs): 62 lb (28.1 kg)   Body Composition  Body Fat %: 31.2 % Fat Mass (lbs): 44.8 lbs Muscle Mass (lbs): 93.6 lbs Total Body Water (lbs): 66.2 lbs Visceral Fat Rating : 6   Other Clinical Data Fasting: yes Labs: no Today's Visit #: 37 Starting Date: 03/25/21    OBESITY Shelley Ramirez is here to discuss her progress with her obesity treatment plan along with follow-up of her obesity related diagnoses.    Nutrition Plan: the Category 1 plan - 95% adherence.  Current exercise: none  Interim History:  She gained 4 lbs over the holidays.  Eating all of the food on the plan., Protein intake is as prescribed, Is not skipping meals, and Water intake is adequate.   Pharmacotherapy: Shelley Ramirez is on Mounjaro  10 mg SQ weekly Adverse side effects: None Hunger is moderately controlled.  Cravings are moderately controlled.  Assessment/Plan:   Polyphagia Shelley Ramirez endorses excessive hunger.  Medication(s): Mounjaro  10 mg Effects of medication:  moderately controlled. Cravings are moderately controlled.   Plan: Medication(s): Mounjaro  10 mg SQ weekly Will increase water, protein and fiber to help assuage hunger.  Will minimize foods that have a high glucose index/load to minimize reactive hypoglycemia.    Insomnia:   She is taking Trazodone .   Plan:  Sleep hygiene.  Continue her medications.    Generalized Obesity: Current BMI BMI (Calculated): 27.03   Pharmacotherapy  Plan Continue and refill  Mounjaro  10 mg SQ weekly  Shelley Ramirez is currently in the action stage of change. As such, her goal is to continue with weight loss efforts.  She has agreed to the Category 1 plan.  Exercise goals: All adults should avoid inactivity. Some physical activity is better than none, and adults who participate in any amount of physical activity gain some health benefits.  Behavioral modification strategies: increasing lean protein intake, decreasing simple carbohydrates , no meal skipping, meal planning , increase water intake, better snacking choices, planning for success, increasing vegetables, avoiding temptations, keep healthy foods in the home, weigh protein portions, measure portion sizes, and mindful eating.  Shelley Ramirez has agreed to follow-up with our clinic in 4 weeks for an IC AND FASTING LABS.     Objective:   VITALS: Per patient if applicable, see vitals. GENERAL: Alert and in no acute distress. CARDIOPULMONARY: No increased WOB. Speaking in clear sentences.  PSYCH: Pleasant and cooperative. Speech normal rate and rhythm. Affect is appropriate. Insight and judgement are appropriate. Attention  is focused, linear, and appropriate.  NEURO: Oriented as arrived to appointment on time with no prompting.   Attestation Statements:   This was prepared with the assistance of Engineer, Civil (consulting).  Occasional wrong-word or sound-a-like substitutions may have occurred due to the inherent limitations of voice recognition.   Clayborne Daring, DO    "

## 2024-08-09 ENCOUNTER — Other Ambulatory Visit (HOSPITAL_COMMUNITY): Payer: Self-pay

## 2024-08-09 ENCOUNTER — Telehealth (HOSPITAL_BASED_OUTPATIENT_CLINIC_OR_DEPARTMENT_OTHER): Payer: Self-pay

## 2024-08-09 ENCOUNTER — Other Ambulatory Visit (HOSPITAL_BASED_OUTPATIENT_CLINIC_OR_DEPARTMENT_OTHER): Payer: Self-pay

## 2024-08-09 ENCOUNTER — Encounter: Payer: Self-pay | Admitting: Family

## 2024-08-12 ENCOUNTER — Other Ambulatory Visit: Payer: Self-pay

## 2024-08-12 ENCOUNTER — Other Ambulatory Visit (HOSPITAL_BASED_OUTPATIENT_CLINIC_OR_DEPARTMENT_OTHER): Payer: Self-pay

## 2024-08-19 ENCOUNTER — Other Ambulatory Visit (HOSPITAL_BASED_OUTPATIENT_CLINIC_OR_DEPARTMENT_OTHER): Payer: Self-pay

## 2024-08-28 ENCOUNTER — Inpatient Hospital Stay: Payer: Self-pay

## 2024-08-29 ENCOUNTER — Encounter: Payer: Self-pay | Admitting: Family

## 2024-09-03 ENCOUNTER — Ambulatory Visit: Admitting: Bariatrics

## 2024-09-26 ENCOUNTER — Inpatient Hospital Stay: Payer: Self-pay

## 2024-09-26 ENCOUNTER — Inpatient Hospital Stay: Payer: Self-pay | Admitting: Medical Oncology

## 2024-10-01 ENCOUNTER — Ambulatory Visit: Admitting: Bariatrics
